# Patient Record
Sex: Female | Born: 1960 | Race: White | Hispanic: No | Marital: Married | Smoking: Never smoker
Health system: Southern US, Community
[De-identification: ages and names within clinical notes are randomized; demographics above are authoritative.]

## PROBLEM LIST (undated history)

## (undated) DIAGNOSIS — F32A Depression, unspecified: Secondary | ICD-10-CM

## (undated) DIAGNOSIS — I214 Non-ST elevation (NSTEMI) myocardial infarction: Secondary | ICD-10-CM

## (undated) DIAGNOSIS — F411 Generalized anxiety disorder: Secondary | ICD-10-CM

---

## 2020-09-10 HISTORY — PX: CARDIAC CATHETERIZATION: SHX172

## 2020-11-01 ENCOUNTER — Other Ambulatory Visit (HOSPITAL_COMMUNITY): Payer: 59

## 2020-11-01 ENCOUNTER — Inpatient Hospital Stay
Admission: AD | Admit: 2020-11-01 | Discharge: 2020-11-22 | Disposition: A | Discharge status: Rehabilitation Facility | Payer: 59 | Source: Other Acute Inpatient Hospital | Attending: Internal Medicine | Admitting: Internal Medicine

## 2020-11-01 DIAGNOSIS — R1310 Dysphagia, unspecified: Secondary | ICD-10-CM

## 2020-11-01 DIAGNOSIS — Z0189 Encounter for other specified special examinations: Secondary | ICD-10-CM

## 2020-11-01 DIAGNOSIS — I2699 Other pulmonary embolism without acute cor pulmonale: Secondary | ICD-10-CM

## 2020-11-01 DIAGNOSIS — I96 Gangrene, not elsewhere classified: Secondary | ICD-10-CM

## 2020-11-01 DIAGNOSIS — D62 Acute posthemorrhagic anemia: Secondary | ICD-10-CM

## 2020-11-01 DIAGNOSIS — J9690 Respiratory failure, unspecified, unspecified whether with hypoxia or hypercapnia: Secondary | ICD-10-CM

## 2020-11-01 DIAGNOSIS — I480 Paroxysmal atrial fibrillation: Secondary | ICD-10-CM

## 2020-11-01 HISTORY — DX: Depression, unspecified: F32.A

## 2020-11-01 HISTORY — DX: Generalized anxiety disorder: F41.1

## 2020-11-01 HISTORY — DX: Non-ST elevation (NSTEMI) myocardial infarction: I21.4

## 2020-11-01 LAB — CBC WITH DIFFERENTIAL/PLATELET
Abs Immature Granulocytes: 0.35 10*3/uL — ABNORMAL HIGH (ref 0.00–0.07)
Basophils Absolute: 0.2 10*3/uL — ABNORMAL HIGH (ref 0.0–0.1)
Basophils Relative: 1 %
Eosinophils Absolute: 0.3 10*3/uL (ref 0.0–0.5)
Eosinophils Relative: 2 %
HCT: 30.7 % — ABNORMAL LOW (ref 36.0–46.0)
Hemoglobin: 9.5 g/dL — ABNORMAL LOW (ref 12.0–15.0)
Immature Granulocytes: 2 %
Lymphocytes Relative: 19 %
Lymphs Abs: 3.4 10*3/uL (ref 0.7–4.0)
MCH: 30.1 pg (ref 26.0–34.0)
MCHC: 30.9 g/dL (ref 30.0–36.0)
MCV: 97.2 fL (ref 80.0–100.0)
Monocytes Absolute: 1.2 10*3/uL — ABNORMAL HIGH (ref 0.1–1.0)
Monocytes Relative: 7 %
Neutro Abs: 12.6 10*3/uL — ABNORMAL HIGH (ref 1.7–7.7)
Neutrophils Relative %: 69 %
Platelets: 396 10*3/uL (ref 150–400)
RBC: 3.16 MIL/uL — ABNORMAL LOW (ref 3.87–5.11)
RDW: 19.6 % — ABNORMAL HIGH (ref 11.5–15.5)
WBC: 18 10*3/uL — ABNORMAL HIGH (ref 4.0–10.5)
nRBC: 0.2 % (ref 0.0–0.2)

## 2020-11-02 LAB — COMPREHENSIVE METABOLIC PANEL
ALT: 19 U/L (ref 0–44)
AST: 24 U/L (ref 15–41)
Albumin: 2.4 g/dL — ABNORMAL LOW (ref 3.5–5.0)
Alkaline Phosphatase: 86 U/L (ref 38–126)
Anion gap: 10 (ref 5–15)
BUN: 29 mg/dL — ABNORMAL HIGH (ref 6–20)
CO2: 24 mmol/L (ref 22–32)
Calcium: 8.6 mg/dL — ABNORMAL LOW (ref 8.9–10.3)
Chloride: 115 mmol/L — ABNORMAL HIGH (ref 98–111)
Creatinine, Ser: 0.81 mg/dL (ref 0.44–1.00)
GFR, Estimated: >60 mL/min (ref >60)
Glucose, Bld: 133 mg/dL — ABNORMAL HIGH (ref 70–99)
Potassium: 3.7 mmol/L (ref 3.5–5.1)
Sodium: 149 mmol/L — ABNORMAL HIGH (ref 135–145)
Total Bilirubin: 1 mg/dL (ref 0.3–1.2)
Total Protein: 6.9 g/dL (ref 6.5–8.1)

## 2020-11-02 LAB — C DIFFICILE QUICK SCREEN W PCR REFLEX
C Diff antigen: NEGATIVE
C Diff interpretation: NOT DETECTED
C Diff toxin: NEGATIVE

## 2020-11-02 LAB — CBC
HCT: 30.2 % — ABNORMAL LOW (ref 36.0–46.0)
Hemoglobin: 9.3 g/dL — ABNORMAL LOW (ref 12.0–15.0)
MCH: 30.5 pg (ref 26.0–34.0)
MCHC: 30.8 g/dL (ref 30.0–36.0)
MCV: 99 fL (ref 80.0–100.0)
Platelets: 373 10*3/uL (ref 150–400)
RBC: 3.05 MIL/uL — ABNORMAL LOW (ref 3.87–5.11)
RDW: 19.5 % — ABNORMAL HIGH (ref 11.5–15.5)
WBC: 19.4 10*3/uL — ABNORMAL HIGH (ref 4.0–10.5)
nRBC: 0 % (ref 0.0–0.2)

## 2020-11-02 LAB — TSH: TSH: 5.77 u[IU]/mL — ABNORMAL HIGH (ref 0.350–4.500)

## 2020-11-02 LAB — PROTIME-INR
INR: 1.8 — ABNORMAL HIGH (ref 0.8–1.2)
Prothrombin Time: 20.9 seconds — ABNORMAL HIGH (ref 11.4–15.2)

## 2020-11-02 LAB — MAGNESIUM: Magnesium: 1.9 mg/dL (ref 1.7–2.4)

## 2020-11-02 LAB — PHOSPHORUS: Phosphorus: 3.7 mg/dL (ref 2.5–4.6)

## 2020-11-02 NOTE — Consult Note (Signed)
Referring Physician: Carron Curie. MD  [name] [name] is an 60 y.o. female.                       Chief Complaint: Tachycardia  HPI: 60 years  Old white female with PMH of Pulmonary embolus, Septic shock, Acute kidney injury, HTN, DIC, Thrombocytopenia and pneumonia, Psoriatic arthritis has sinus tachycardia from dehydration and sepsis.  She has normal coronaries on cardiac cath done on 09/24/2020 and normal LV systolic function on 10/09/2020. Her sodium and chloride are elevated at 149 and 115 respectively. Albumin is low at 2.4 gm. Magnesium and phosphorus are normal. TSH is mildly elevated at 5.770 uIU. Patient denies chest pain or palpitation. She has mild fever.   PMH: As per HPI.  Surgical History   Surgery Date Site/Laterality Comments  CESAREAN SECTION, CLASSIC 1987 & 37      Family History   Medical History Relation Comments  Diabetes Father    Hyperlipidemia Father     Relation Status Comments  Father      Social History   Tobacco Use Types Packs/Day Years Used Date  Never Smoker      Smokeless Tobacco: Never Used      Alcohol Use Standard Drinks/Week Comments  No 0 (1 standard drink = 0.6 oz pure alcohol)     Allergies: No known allergies.  Medications prior to admission.  See med list on chart.  IV fluconazole, Tygacil, amiodarone, Diltiazem, Eliquis, Metoprolol etc.  Results for orders placed or performed during the hospital encounter of 11/01/20 (from the past 48 hour(s))  C Difficile Quick Screen w PCR reflex     Status: None   Collection Time: 11/01/20  4:13 PM   Specimen: STOOL  Result Value Ref Range   C Diff antigen NEGATIVE NEGATIVE   C Diff toxin NEGATIVE NEGATIVE   C Diff interpretation No C. difficile detected.     Comment: Performed at [facility], [address], [place], [place] [address]  CBC with Differential/Platelet     Status: Abnormal   Collection Time: 11/01/20  5:09 PM  Result Value Ref Range   WBC 18.0 (H)  4.0 - 10.5 K/uL   RBC 3.16 (L) 3.87 - 5.11 MIL/uL   Hemoglobin 9.5 (L) 12.0 - 15.0 g/dL   HCT 81.8 (L) 29.9 - 37.1 %   MCV 97.2 80.0 - 100.0 fL   MCH 30.1 26.0 - 34.0 pg   MCHC 30.9 30.0 - 36.0 g/dL   RDW 69.6 (H) 78.9 - 38.1 %   Platelets 396 150 - 400 K/uL   nRBC 0.2 0.0 - 0.2 %   Neutrophils Relative % 69 %   Neutro Abs 12.6 (H) 1.7 - 7.7 K/uL   Lymphocytes Relative 19 %   Lymphs Abs 3.4 0.7 - 4.0 K/uL   Monocytes Relative 7 %   Monocytes Absolute 1.2 (H) 0.1 - 1.0 K/uL   Eosinophils Relative 2 %   Eosinophils Absolute 0.3 0.0 - 0.5 K/uL   Basophils Relative 1 %   Basophils Absolute 0.2 (H) 0.0 - 0.1 K/uL   Immature Granulocytes 2 %   Abs Immature Granulocytes 0.35 (H) 0.00 - 0.07 K/uL    Comment: Performed at [facility], [address], [place], [place] [address]  Comprehensive metabolic panel     Status: Abnormal   Collection Time: 11/02/20  4:27 AM  Result Value Ref Range   Sodium 149 (H) 135 - 145 mmol/L  Potassium 3.7 3.5 - 5.1 mmol/L   Chloride 115 (H) 98 - 111 mmol/L   CO2 24 22 - 32 mmol/L   Glucose, Bld 133 (H) 70 - 99 mg/dL    Comment: Glucose reference range applies only to samples taken after fasting for at least 8 hours.   BUN 29 (H) 6 - 20 mg/dL   Creatinine, Ser 7.61 0.44 - 1.00 mg/dL   Calcium 8.6 (L) 8.9 - 10.3 mg/dL   Total Protein 6.9 6.5 - 8.1 g/dL   Albumin 2.4 (L) 3.5 - 5.0 g/dL   AST 24 15 - 41 U/L   ALT 19 0 - 44 U/L   Alkaline Phosphatase 86 38 - 126 U/L   Total Bilirubin 1.0 0.3 - 1.2 mg/dL   GFR, Estimated >60 >73 mL/min    Comment: (NOTE) Calculated using the CKD-EPI Creatinine Equation (2021)    Anion gap 10 5 - 15    Comment: Performed at [facility], [address], [place], [place] [address]  Protime-INR     Status: Abnormal   Collection Time: 11/02/20  4:27 AM  Result Value Ref Range   Prothrombin Time 20.9 (H) 11.4 - 15.2 seconds   INR 1.8 (H) 0.8 - 1.2    Comment: (NOTE) INR goal varies based on device and  disease states. Performed at [facility], [address] [place], [place] [address]   CBC     Status: Abnormal   Collection Time: 11/02/20  4:27 AM  Result Value Ref Range   WBC 19.4 (H) 4.0 - 10.5 K/uL   RBC 3.05 (L) 3.87 - 5.11 MIL/uL   Hemoglobin 9.3 (L) 12.0 - 15.0 g/dL   HCT 46.2 (L) 70.3 - 50.0 %   MCV 99.0 80.0 - 100.0 fL   MCH 30.5 26.0 - 34.0 pg   MCHC 30.8 30.0 - 36.0 g/dL   RDW 93.8 (H) 18.2 - 99.3 %   Platelets 373 150 - 400 K/uL   nRBC 0.0 0.0 - 0.2 %    Comment: Performed at [facility], [address], [place], [place] [address]  Magnesium     Status: None   Collection Time: 11/02/20  4:27 AM  Result Value Ref Range   Magnesium 1.9 1.7 - 2.4 mg/dL    Comment: Performed at [facility], [address] [place], [place] [address]  Phosphorus     Status: None   Collection Time: 11/02/20  4:27 AM  Result Value Ref Range   Phosphorus 3.7 2.5 - 4.6 mg/dL    Comment: Performed at [facility], [address] [place], [place] [address]  TSH     Status: Abnormal   Collection Time: 11/02/20  4:36 PM  Result Value Ref Range   TSH 5.770 (H) 0.350 - 4.500 uIU/mL    Comment: Performed by a 3rd Generation assay with a functional sensitivity of <=0.01 uIU/mL. Performed at [facility], [address] [place], [place] [address]    DG Chest Port 1 View  Result Date: 11/01/2020 CLINICAL DATA:  Respiratory failure. EXAM: PORTABLE CHEST 1 VIEW COMPARISON:  None. FINDINGS: The heart size and mediastinal contours are within normal limits. No pneumothorax is noted. Left lung is clear. Large right basilar opacity is noted concerning for pneumonia or atelectasis with associated small right pleural effusion. Right-sided PICC line is noted with distal tip in expected position of the SVC. The visualized skeletal structures are unremarkable.  IMPRESSION: Large right basilar opacity is noted concerning for pneumonia or atelectasis with associated small right  pleural effusion. Electronically Signed   By: Lupita Raider M.D.   On: 11/01/2020 16:35   DG Abd Portable 1V  Result Date: 11/01/2020 CLINICAL DATA:  Feeding tube placement. EXAM: PORTABLE ABDOMEN - 1 VIEW COMPARISON:  None. FINDINGS: Feeding tube is seen passing through the esophagus and duodenum and beyond the expected position of the ligament of Treitz, with distal tip in expected position of proximal jejunum. IMPRESSION: Distal tip of feeding tube is seen in expected position of proximal jejunum. Electronically Signed   By: Lupita Raider M.D.   On: 11/01/2020 16:34    Review Of Systems Constitutional: No fever, chills, weight loss or gain. Eyes: No vision change, wears glasses. No discharge or pain. Ears: No hearing loss, No tinnitus. Respiratory: No asthma, COPD, positive pneumonias, shortness of breath. No hemoptysis. Cardiovascular: No chest pain, palpitation, leg edema. Gastrointestinal: No nausea, vomiting, diarrhea, constipation. No GI bleed. No hepatitis. Genitourinary: No dysuria, hematuria, kidney stone. No incontinance. Neurological: No headache, stroke, seizures.  Psychiatry: No psych facility admission for anxiety, depression, suicide. No detox. Skin: No rash. Musculoskeletal: No joint pain, fibromyalgia. No neck pain, back pain. Lymphadenopathy: No lymphadenopathy. Hematology: Positive anemia or easy bruising.  P: 114, R: 26, BP: 103/54, O2 sat 94 % on room air.  There is no height or weight on file to calculate BMI. General appearance: alert, cooperative, appears stated age and moderate respiratory distress Head: Normocephalic, atraumatic. Eyes: Blue eyes, pale pink conjunctiva, corneas clear.  Neck: No adenopathy, no carotid bruit, no JVD, supple, symmetrical, trachea midline and thyroid not enlarged. Resp: Basal crackles to auscultation on right side. Cardio: Tahcycardic, Regular rate and rhythm, S1, S2 normal, II/VI systolic murmur, no click, rub or gallop GI:  Soft, non-tender; bowel sounds normal; no organomegaly. Extremities: Trace edema, cyanosis or clubbing. Dark skin of toes of both feet. Skin: Warm and dry.  Neurologic: Alert and oriented X 2, moves all 4 extremites..   Assessment/Plan Acute on chronic respiratory failure with hypoxia Sinus tachycardia due to dehydration, sepsis, fever. S/P Sepsis S/P right lung pneumonia S/P UTI Dehydration Hypernatremia and hyperchloremia Sinus tachycardia Psoriatic arthritis Anemia of chronic disease Hypoalbuminemia  Agree with decreasing amiodarone dose. Agree with IV fluids like D5W IV albumin x 1. Supplemental oxygen as needed.  Time spent: Review of old records, Lab, x-rays, EKG, other cardiac tests, examination, discussion with patient over 70 minutes.  Ricki Rodriguez, MD  11/02/2020, 9:39 PM

## 2020-11-03 NOTE — Consult Note (Signed)
Ref: Pcp, No   Subjective:  Awake. Hypotension improves with IV fluid boluses.  Objective:  Vital Signs in the last 24 hours: BP: 117/48, P: 84, R: 21, O2 sat 98 % on 2 L by [place].   Physical Exam: BP Readings from Last 1 Encounters:  No data found for BP     Wt Readings from Last 1 Encounters:  No data found for Wt    Weight change:  There is no height or weight on file to calculate BMI. HEENT: [place]/AT, Eyes-Blue, Conjunctiva-Pale pink, Sclera-Non-icteric Neck: No JVD, No bruit, Trachea midline. Lungs:  Clearing, Bilateral. Cardiac:  Regular rhythm, normal S1 and S2, no S3. II/VI systolic murmur. Abdomen:  Soft, non-tender. BS present. Extremities:  1 + edema present. No cyanosis. No clubbing. CNS: AxOx2, Cranial nerves grossly intact, moves all 4 extremities.  Skin: Warm and dry.   Intake/Output from previous day: No intake/output data recorded.    Lab Results: BMET    Component Value Date/Time   NA 149 (H) 11/02/2020 0427   K 3.7 11/02/2020 0427   CL 115 (H) 11/02/2020 0427   CO2 24 11/02/2020 0427   GLUCOSE 133 (H) 11/02/2020 0427   BUN 29 (H) 11/02/2020 0427   CREATININE 0.81 11/02/2020 0427   CALCIUM 8.6 (L) 11/02/2020 0427   GFRNONAA >60 11/02/2020 0427   CBC    Component Value Date/Time   WBC 19.4 (H) 11/02/2020 0427   RBC 3.05 (L) 11/02/2020 0427   HGB 9.3 (L) 11/02/2020 0427   HCT 30.2 (L) 11/02/2020 0427   PLT 373 11/02/2020 0427   MCV 99.0 11/02/2020 0427   MCH 30.5 11/02/2020 0427   MCHC 30.8 11/02/2020 0427   RDW 19.5 (H) 11/02/2020 0427   LYMPHSABS 3.4 11/01/2020 1709   MONOABS 1.2 (H) 11/01/2020 1709   EOSABS 0.3 11/01/2020 1709   BASOSABS 0.2 (H) 11/01/2020 1709   HEPATIC Function Panel Recent Labs    11/02/20 0427  PROT 6.9   HEMOGLOBIN A1C No components found for: HGA1C,  MPG CARDIAC ENZYMES No results found for: CKTOTAL, CKMB, CKMBINDEX, TROPONINI BNP No results for input(s): PROBNP in the last 8760 hours. TSH Recent Labs     11/02/20 1636  TSH 5.770*   CHOLESTEROL No results for input(s): CHOL in the last 8760 hours.  Scheduled Meds: Continuous Infusions: PRN Meds:.  Assessment/Plan: Acute on chronic respiratory failure with hypoxia Sinus tachycardia, controlled S/P Sepsis S/P right lung pneumonia S/P UTI Dehydration, improving Psoriatic arthritis Anemia of chronic disease Hypoalbuminemia  CBC and CMET in AM.   LOS: 0 days   Time spent including chart review, lab review, examination, discussion with patient/Nurse : 30 min   Orpah Cobb  MD  11/03/2020, 9:23 PM

## 2020-11-04 LAB — CBC WITH DIFFERENTIAL/PLATELET
Abs Immature Granulocytes: 0.21 10*3/uL — ABNORMAL HIGH (ref 0.00–0.07)
Basophils Absolute: 0.1 10*3/uL (ref 0.0–0.1)
Basophils Relative: 1 %
Eosinophils Absolute: 0.3 10*3/uL (ref 0.0–0.5)
Eosinophils Relative: 2 %
HCT: 24.5 % — ABNORMAL LOW (ref 36.0–46.0)
Hemoglobin: 7.4 g/dL — ABNORMAL LOW (ref 12.0–15.0)
Immature Granulocytes: 1 %
Lymphocytes Relative: 16 %
Lymphs Abs: 2.5 10*3/uL (ref 0.7–4.0)
MCH: 30 pg (ref 26.0–34.0)
MCHC: 30.2 g/dL (ref 30.0–36.0)
MCV: 99.2 fL (ref 80.0–100.0)
Monocytes Absolute: 0.8 10*3/uL (ref 0.1–1.0)
Monocytes Relative: 5 %
Neutro Abs: 11.8 10*3/uL — ABNORMAL HIGH (ref 1.7–7.7)
Neutrophils Relative %: 75 %
Platelets: 382 10*3/uL (ref 150–400)
RBC: 2.47 MIL/uL — ABNORMAL LOW (ref 3.87–5.11)
RDW: 17.8 % — ABNORMAL HIGH (ref 11.5–15.5)
WBC: 15.6 10*3/uL — ABNORMAL HIGH (ref 4.0–10.5)
nRBC: 0 % (ref 0.0–0.2)

## 2020-11-04 LAB — PROTIME-INR
INR: 1.8 — ABNORMAL HIGH (ref 0.8–1.2)
Prothrombin Time: 20.5 seconds — ABNORMAL HIGH (ref 11.4–15.2)

## 2020-11-04 LAB — COMPREHENSIVE METABOLIC PANEL
ALT: 18 U/L (ref 0–44)
AST: 22 U/L (ref 15–41)
Albumin: 1.9 g/dL — ABNORMAL LOW (ref 3.5–5.0)
Alkaline Phosphatase: 69 U/L (ref 38–126)
Anion gap: 6 (ref 5–15)
BUN: 19 mg/dL (ref 6–20)
CO2: 26 mmol/L (ref 22–32)
Calcium: 7.8 mg/dL — ABNORMAL LOW (ref 8.9–10.3)
Chloride: 111 mmol/L (ref 98–111)
Creatinine, Ser: 0.74 mg/dL (ref 0.44–1.00)
GFR, Estimated: >60 mL/min (ref >60)
Glucose, Bld: 109 mg/dL — ABNORMAL HIGH (ref 70–99)
Potassium: 2.9 mmol/L — ABNORMAL LOW (ref 3.5–5.1)
Sodium: 143 mmol/L (ref 135–145)
Total Bilirubin: 0.6 mg/dL (ref 0.3–1.2)
Total Protein: 5.9 g/dL — ABNORMAL LOW (ref 6.5–8.1)

## 2020-11-04 LAB — PREPARE RBC (CROSSMATCH)

## 2020-11-04 LAB — ABO/RH: ABO/RH(D): O POS

## 2020-11-05 LAB — TYPE AND SCREEN
ABO/RH(D): O POS
Antibody Screen: NEGATIVE
Unit division: 0

## 2020-11-05 LAB — BASIC METABOLIC PANEL
Anion gap: 9 (ref 5–15)
BUN: 12 mg/dL (ref 6–20)
CO2: 25 mmol/L (ref 22–32)
Calcium: 7.8 mg/dL — ABNORMAL LOW (ref 8.9–10.3)
Chloride: 111 mmol/L (ref 98–111)
Creatinine, Ser: 0.6 mg/dL (ref 0.44–1.00)
GFR, Estimated: >60 mL/min (ref >60)
Glucose, Bld: 100 mg/dL — ABNORMAL HIGH (ref 70–99)
Potassium: 3.5 mmol/L (ref 3.5–5.1)
Sodium: 145 mmol/L (ref 135–145)

## 2020-11-05 LAB — BPAM RBC
Blood Product Expiration Date: 202205202359
ISSUE DATE / TIME: 202204251521
Unit Type and Rh: 5100

## 2020-11-05 LAB — VANCOMYCIN, TROUGH: Vancomycin Tr: 14 ug/mL — ABNORMAL LOW (ref 15–20)

## 2020-11-06 LAB — EXPECTORATED SPUTUM ASSESSMENT W GRAM STAIN, RFLX TO RESP C

## 2020-11-06 LAB — CBC
HCT: 31.3 % — ABNORMAL LOW (ref 36.0–46.0)
Hemoglobin: 9.7 g/dL — ABNORMAL LOW (ref 12.0–15.0)
MCH: 30.1 pg (ref 26.0–34.0)
MCHC: 31 g/dL (ref 30.0–36.0)
MCV: 97.2 fL (ref 80.0–100.0)
Platelets: 494 10*3/uL — ABNORMAL HIGH (ref 150–400)
RBC: 3.22 MIL/uL — ABNORMAL LOW (ref 3.87–5.11)
RDW: 18.7 % — ABNORMAL HIGH (ref 11.5–15.5)
WBC: 9.9 10*3/uL (ref 4.0–10.5)
nRBC: 0.2 % (ref 0.0–0.2)

## 2020-11-07 ENCOUNTER — Other Ambulatory Visit (HOSPITAL_COMMUNITY): Payer: 59

## 2020-11-07 LAB — BASIC METABOLIC PANEL
Anion gap: 10 (ref 5–15)
BUN: 8 mg/dL (ref 6–20)
CO2: 27 mmol/L (ref 22–32)
Calcium: 7.6 mg/dL — ABNORMAL LOW (ref 8.9–10.3)
Chloride: 102 mmol/L (ref 98–111)
Creatinine, Ser: 0.59 mg/dL (ref 0.44–1.00)
GFR, Estimated: >60 mL/min (ref >60)
Glucose, Bld: 123 mg/dL — ABNORMAL HIGH (ref 70–99)
Potassium: 3.7 mmol/L (ref 3.5–5.1)
Sodium: 139 mmol/L (ref 135–145)

## 2020-11-07 LAB — CBC
HCT: 31.1 % — ABNORMAL LOW (ref 36.0–46.0)
Hemoglobin: 9.5 g/dL — ABNORMAL LOW (ref 12.0–15.0)
MCH: 29.7 pg (ref 26.0–34.0)
MCHC: 30.5 g/dL (ref 30.0–36.0)
MCV: 97.2 fL (ref 80.0–100.0)
Platelets: 502 10*3/uL — ABNORMAL HIGH (ref 150–400)
RBC: 3.2 MIL/uL — ABNORMAL LOW (ref 3.87–5.11)
RDW: 18.5 % — ABNORMAL HIGH (ref 11.5–15.5)
WBC: 9.9 10*3/uL (ref 4.0–10.5)
nRBC: 0 % (ref 0.0–0.2)

## 2020-11-08 LAB — CULTURE, BLOOD (ROUTINE X 2)
Culture: NO GROWTH
Culture: NO GROWTH
Special Requests: ADEQUATE
Special Requests: ADEQUATE

## 2020-11-08 NOTE — Consult Note (Signed)
Infectious Disease Consultation   [name] [name]  YBW:[id number]  DOB: [birth year]  DOA: 11/01/2020  Requesting physician: Dr. Manson Passey  Reason for consultation: Antibiotic recommendations   History of Present Illness: [name] [name] is an 60 y.o. female with medical history significant of psoriatic arthritis, and anti-interleukin-17A antibody, hypertension, coronary artery disease status post NSTEMI in the past and was admitted to the acute facility on 10/08/2020 with septic shock.  Her blood cultures showed Streptococcus pyogenous.  She also had DIC with acute renal failure necessitating CRRT and also had elevated troponins.  Patient was started on hemodialysis.  She also underwent thoracentesis.  She received treatment with multiple antibiotics including clindamycin, Levaquin, Flagyl, empiric fluconazole, Tygacil at the acute facility per infectious disease.  She also received treatment with IVIG.  She developed digital ischemia while on pressors due to the sepsis.  She developed respiratory failure and was intubated.  She was later extubated.  Patient also had right lower lobe pneumonia and was treated for that.  Due to her complex medical problems she was transferred and admitted to [facility].  After admission here patient reportedly started having fevers.  There was also concern for worsening infection of the lower extremities.  She is complaining of shortness of breath, generalized aches and pains.  She is also having fevers. On further questioning the patient she mentioned that she recently had a heart cath and after that had dental extraction done and that probably was the likely source for the bacteremia.  Review of Systems:  Review of systems negative except as mentioned above in the HPI.  Past Medical History: Psoriatic arthritis, hypertension, coronary artery disease status post NSTEMI  Past Surgical History: History of C-section  Allergies: No known drug  allergies  Social History: No history of smoking, alcohol or recreational drug abuse   Family History: History of CVA  Physical Exam: Temperature 99.7, pulse 96, respiratory 19, blood pressure 127/79, oxygen saturation 95% Constitutional: Awake and oriented Head: Atraumatic, normocephalic Eyes: PERLA, EOMI, irises appear normal, anicteric sclera,  ENMT: external ears and nose appear normal, normal hearing, Lips appears normal, moist oral mucosa Neck: neck appears normal, no masses  CVS: S1-S2  Respiratory: Decreased breath sound lower lobes, rhonchi, no wheezing.  Abdomen: soft nontender, nondistended, normal bowel sounds, no hepatosplenomegaly, no hernias  Musculoskeletal: She has ischemia with dry gangrene of fingers of both hands, toes Neuro: Debility with generalized weakness otherwise grossly nonfocal Psych: stable mood and affect, mental status Skin: Ischemia of the digits as mentioned above.  Data reviewed:  I have personally reviewed following labs and imaging studies Labs:  CBC: Recent Labs  Lab 11/02/20 0427 11/04/20 0738 11/06/20 0658 11/07/20 0614  WBC 19.4* 15.6* 9.9 9.9  NEUTROABS  --  11.8*  --   --   HGB 9.3* 7.4* 9.7* 9.5*  HCT 30.2* 24.5* 31.3* 31.1*  MCV 99.0 99.2 97.2 97.2  PLT 373 382 494* 502*    Basic Metabolic Panel: Recent Labs  Lab 11/02/20 0427 11/04/20 0738 11/05/20 0436 11/07/20 0527  NA 149* 143 145 139  K 3.7 2.9* 3.5 3.7  CL 115* 111 111 102  CO2 24 26 25 27   GLUCOSE 133* 109* 100* 123*  BUN 29* 19 12 8   CREATININE 0.81 0.74 0.60 0.59  CALCIUM 8.6* 7.8* 7.8* 7.6*  MG 1.9  --   --   --   PHOS 3.7  --   --   --  GFR CrCl cannot be calculated (Unknown ideal weight.). Liver Function Tests: Recent Labs  Lab 11/02/20 0427 11/04/20 0738  AST 24 22  ALT 19 18  ALKPHOS 86 69  BILITOT 1.0 0.6  PROT 6.9 5.9*  ALBUMIN 2.4* 1.9*   No results for input(s): LIPASE, AMYLASE in the last 168 hours. No results for input(s):  AMMONIA in the last 168 hours. Coagulation profile Recent Labs  Lab 11/02/20 0427 11/04/20 0738  INR 1.8* 1.8*    Cardiac Enzymes: No results for input(s): CKTOTAL, CKMB, CKMBINDEX, TROPONINI in the last 168 hours. BNP: Invalid input(s): POCBNP CBG: No results for input(s): GLUCAP in the last 168 hours. D-Dimer No results for input(s): DDIMER in the last 72 hours. Hgb A1c No results for input(s): HGBA1C in the last 72 hours. Lipid Profile No results for input(s): CHOL, HDL, LDLCALC, TRIG, CHOLHDL, LDLDIRECT in the last 72 hours. Thyroid function studies No results for input(s): TSH, T4TOTAL, T3FREE, THYROIDAB in the last 72 hours.  Invalid input(s): FREET3 Anemia work up No results for input(s): VITAMINB12, FOLATE, FERRITIN, TIBC, IRON, RETICCTPCT in the last 72 hours. Urinalysis No results found for: COLORURINE, APPEARANCEUR, LABSPEC, PHURINE, GLUCOSEU, HGBUR, BILIRUBINUR, KETONESUR, PROTEINUR, UROBILINOGEN, NITRITE, LEUKOCYTESUR   Sepsis Labs Invalid input(s): PROCALCITONIN,  WBC,  LACTICIDVEN Microbiology Recent Results (from the past 240 hour(s))  C Difficile Quick Screen w PCR reflex     Status: None   Collection Time: 11/01/20  4:13 PM   Specimen: STOOL  Result Value Ref Range Status   C Diff antigen NEGATIVE NEGATIVE Final   C Diff toxin NEGATIVE NEGATIVE Final   C Diff interpretation No C. difficile detected.  Final    Comment: Performed at [facility], [address] [place], [place] [address]  Culture, Respiratory w Gram Stain     Status: None (Preliminary result)   Collection Time: 11/03/20 12:00 PM  Result Value Ref Range Status   Specimen Description EXPECTORATED SPUTUM  Final   Special Requests NONE  Final   Gram Stain   Final    MODERATE WBC PRESENT,BOTH PMN AND MONONUCLEAR FEW GRAM POSITIVE COCCI IN PAIRS IN CLUSTERS RARE YEAST    Culture   Final    FEW PSEUDOMONAS AERUGINOSA SUSCEPTIBILITIES TO FOLLOW Performed at [facility], [address] [place], [place] [address]    Report Status PENDING  Incomplete  Culture, blood (routine x 2)     Status: None   Collection Time: 11/03/20  2:06 PM   Specimen: BLOOD  Result Value Ref Range Status   Specimen Description BLOOD RIGHT ANTECUBITAL  Final   Special Requests   Final    BOTTLES DRAWN AEROBIC AND ANAEROBIC Blood Culture adequate volume   Culture   Final    NO GROWTH 5 DAYS Performed at [facility], [address] [place], [place] [address]    Report Status 11/08/2020 FINAL  Final  Culture, blood (routine x 2)     Status: None   Collection Time: 11/03/20  2:12 PM   Specimen: BLOOD  Result Value Ref Range Status   Specimen Description BLOOD LEFT ANTECUBITAL  Final   Special Requests   Final    BOTTLES DRAWN AEROBIC ONLY Blood Culture adequate volume   Culture   Final    NO GROWTH 5 DAYS Performed at [facility], [address] [place], [place] [address]    Report Status 11/08/2020 FINAL  Final  Expectorated Sputum Assessment w  Gram Stain, Rflx to Resp Cult     Status: None   Collection Time: 11/03/20  3:12 PM   Specimen: Expectorated Sputum  Result Value Ref Range Status   Specimen Description EXPECTORATED SPUTUM  Final   Special Requests NONE  Final   Sputum evaluation   Final    THIS SPECIMEN IS ACCEPTABLE FOR SPUTUM CULTURE Performed at [facility], [address], [place], [place] [address]    Report Status 11/06/2020 FINAL  Final    Inpatient Medications:   Please see MAR   Radiological Exams on Admission: CT FOOT LEFT WO CONTRAST  Result Date: 11/07/2020 CLINICAL DATA:  Sepsis EXAM: CT OF THE LEFT FOOT WITHOUT CONTRAST TECHNIQUE: Multidetector CT imaging of the left foot was performed according to the standard protocol. Multiplanar CT image reconstructions were also generated. COMPARISON:  None. FINDINGS: Bones/Joint/Cartilage Normal alignment. No acute fracture or dislocation. Joint spaces are preserved. No osseous  erosions or abnormal periosteal reaction. Ligaments Suboptimally assessed by CT. Muscles and Tendons Mild calcific tendinitis involving the insertion of the Achilles tendon upon the calcaneus. Extensor, flexor, and peroneal tendons are unremarkable. Soft tissues There is marked attenuation of the soft tissues superficial to the a distal phalanx of the second digit with a superficial wound involving the pad of the second digit approaching the a distal, volar aspect of the distal phalanx. There is moderate subcutaneous edema involving the digits of the a left foot and involving the plantar aspect of the left foot. No discrete drainable fluid collection identified. There is dermal thickening/edema noted superficial to the medial malleolus and dorsal lateral to the cuboid. IMPRESSION: Soft tissue attenuation and probable wound involving the terminal portion of the second digit approaching the subjacent distal phalanx. No superimposed osseous erosion to suggest osteomyelitis. Diffuse subcutaneous edema involving the digits and plantar aspect of the left foot. Focal dermal thickening/edema superficial to the medial malleolus and dorsal lateral to the cuboid. No discrete drainable fluid collection. Electronically Signed   By: Helyn Numbers MD   On: 11/07/2020 03:19   CT FOOT RIGHT WO CONTRAST  Result Date: 11/07/2020 CLINICAL DATA:  Sepsis EXAM: CT OF THE RIGHT FOOT WITHOUT CONTRAST TECHNIQUE: Multidetector CT imaging of the right foot was performed according to the standard protocol. Multiplanar CT image reconstructions were also generated. COMPARISON:  None. FINDINGS: Bones/Joint/Cartilage Degenerative changes in the first metatarsal-phalangeal joint. No evidence of acute fracture or dislocation. No focal bone erosion or sclerosis to suggest osteomyelitis. MRI would be more sensitive for evaluation of bone infection if clinically indicated. Ligaments Suboptimally assessed by CT. Muscles and Tendons Grossly intact as  visualized. Soft tissues Diffuse soft tissue edema. No loculated collections are appreciated. IMPRESSION: 1. Degenerative changes in the first metatarsal-phalangeal joint. 2. No evidence of acute fracture or dislocation. 3. Diffuse soft tissue edema. No loculated collections are appreciated. 4. No focal bone erosion or sclerosis to suggest osteomyelitis. MRI would be more sensitive for evaluation of bone infection if clinically indicated. Electronically Signed   By: Burman Nieves M.D.   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 fib with RVR Protein calorie malnutrition/dysphagia  Sepsis with shock: Patient was admitted to the acute facility with sepsis and shock.  She was on pressors at the acute facility.  This was secondary to bacteremia with blood cultures that showed Streptococcus pyogenes patient received multiple antimicrobials at the acute facility as mentioned above.  After presentation here she started having fevers again.  Suggested empiric treatment with IV vancomycin, cefepime, Flagyl.  Empiric clindamycin also was added because of concern for worsening infection of the lower extremities.  CT of the lower extremities did not show any evidence of osteomyelitis or abscess.  Therefore suggest to discontinue clindamycin.  Respiratory cultures showing Pseudomonas aeruginosa.  Preliminary blood cultures do not show any growth.  Follow-up on the final blood cultures and adjust antibiotics accordingly.  Please monitor BUN/creatinine closely while on antibiotics.  Bacteremia: Patient had bacteremia with Streptococcus pyogenes at the acute facility.  She also has sepsis and was on pressors.  Here  she was having fevers.  On empiric antibiotics as mentioned above.  Clinically blood cultures from here did not show any growth.  Follow-up on the final blood cultures and adjust antibiotics accordingly.  Patient reportedly had 2D echocardiogram done at the acute facility that did not show any vegetations.  Pneumonia: Respiratory cultures collected on 2 11/03/2020 showing Pseudomonas aeruginosa and few gram-positive cocci in pairs and clusters on the gram stain.  On antibiotics as mentioned above.  She also has dysphagia and at risk for aspiration.  Plan to treat for duration of 1 week pending improvement.  If respiratory status worsens suggest repeat chest imaging preferably chest CT which can be done without contrast if concern for renal compromise.  She had some diarrhea but C. difficile negative.  She is high risk for C. difficile given the multiple antibiotics that she has received.  If she starts having diarrhea again then suggest to treat her with empiric p.o. vancomycin.  Ischemia with dry gangrene of the digits of the hands and feet: There was some concern of worsening necrosis of the lower extremities.  CT of the feet does not show any evidence of osteomyelitis or any evidence of worsening necrosis or gas producing organisms.  Suggest to discontinue clindamycin.  Continue the other antibiotics.  The dry gangrene and ischemia was as a result of the patient being on pressors at the acute facility.  Continue local wound care.  DIC/transaminitis: As a result of the sepsis.  Please monitor counts closely.  Further management per the primary team.  Acute hypoxemic respiratory failure: This was likely from the sepsis.  She also was treated for aspiration pneumonia at the acute facility.  Respiratory cultures here showing Pseudomonas.  Antibiotics as mentioned above.  She is currently extubated.  Continue to monitor respiratory status.  As mentioned above, due to her dysphagia she is high risk for recurrent  aspiration and worsening respiratory failure despite being on antibiotics.  UTI: Urine culture showed yeast.  She is status post treatment with Diflucan at the acute facility.  Currently denies having any urinary symptoms.  Continue to monitor.  Coronary disease status post NSTEMI: Continue medication and management per the primary team.  A. fib with RVR: Continue medications and management per primary team.  She is on anticoagulation with Eliquis.  Dysphagia/protein calorie malnutrition: Management per primary team.  Due to the dysphagia she is high risk for recurrent aspiration and aspiration pneumonia despite being on antibiotics.  Plan of care discussed with the patient.  Plan of care also discussed with the primary team and pharmacy.   Thank you for this consultation.    Vonzella Nipple M.D. 11/08/2020, 5:50 PM

## 2020-11-09 LAB — CULTURE, RESPIRATORY W GRAM STAIN

## 2020-11-09 NOTE — Consult Note (Signed)
Ref: Pcp, No   Subjective:  Resting comfortably. Off oxygen by [place]. Episode of 2 second sinus pause v/s AV block.  Objective:  Vital Signs in the last 24 hours: BP: 100/60, P: 86, R: 14, O2 sat 95 %.  Physical Exam: BP Readings from Last 1 Encounters:  No data found for BP     Wt Readings from Last 1 Encounters:  No data found for Wt    Weight change:  There is no height or weight on file to calculate BMI. HEENT: [place]/AT, Eyes-Blue, Conjunctiva-Pink, Sclera-Non-icteric Neck: No JVD, No bruit, Trachea midline. Lungs:  Clear, Bilateral. Cardiac:  Regular rhythm, normal S1 and S2, no S3. II/VI systolic murmur. Abdomen:  Soft, non-tender. BS present. Extremities:  Trace edema present. No cyanosis. No clubbing. CNS: AxOx2, Cranial nerves grossly intact, moves all 4 extremities.  Skin: Warm and dry.   Intake/Output from previous day: No intake/output data recorded.    Lab Results: BMET    Component Value Date/Time   NA 139 11/07/2020 0527   NA 145 11/05/2020 0436   NA 143 11/04/2020 0738   K 3.7 11/07/2020 0527   K 3.5 11/05/2020 0436   K 2.9 (L) 11/04/2020 0738   CL 102 11/07/2020 0527   CL 111 11/05/2020 0436   CL 111 11/04/2020 0738   CO2 27 11/07/2020 0527   CO2 25 11/05/2020 0436   CO2 26 11/04/2020 0738   GLUCOSE 123 (H) 11/07/2020 0527   GLUCOSE 100 (H) 11/05/2020 0436   GLUCOSE 109 (H) 11/04/2020 0738   BUN 8 11/07/2020 0527   BUN 12 11/05/2020 0436   BUN 19 11/04/2020 0738   CREATININE 0.59 11/07/2020 0527   CREATININE 0.60 11/05/2020 0436   CREATININE 0.74 11/04/2020 0738   CALCIUM 7.6 (L) 11/07/2020 0527   CALCIUM 7.8 (L) 11/05/2020 0436   CALCIUM 7.8 (L) 11/04/2020 0738   GFRNONAA >60 11/07/2020 0527   GFRNONAA >60 11/05/2020 0436   GFRNONAA >60 11/04/2020 0738   CBC    Component Value Date/Time   WBC 9.9 11/07/2020 0614   RBC 3.20 (L) 11/07/2020 0614   HGB 9.5 (L) 11/07/2020 0614   HCT 31.1 (L) 11/07/2020 0614   PLT 502 (H) 11/07/2020 0614    MCV 97.2 11/07/2020 0614   MCH 29.7 11/07/2020 0614   MCHC 30.5 11/07/2020 0614   RDW 18.5 (H) 11/07/2020 0614   LYMPHSABS 2.5 11/04/2020 0738   MONOABS 0.8 11/04/2020 0738   EOSABS 0.3 11/04/2020 0738   BASOSABS 0.1 11/04/2020 0738   HEPATIC Function Panel Recent Labs    11/02/20 0427 11/04/20 0738  PROT 6.9 5.9*   HEMOGLOBIN A1C No components found for: HGA1C,  MPG CARDIAC ENZYMES No results found for: CKTOTAL, CKMB, CKMBINDEX, TROPONINI BNP No results for input(s): PROBNP in the last 8760 hours. TSH Recent Labs    11/02/20 1636  TSH 5.770*   CHOLESTEROL No results for input(s): CHOL in the last 8760 hours.  Scheduled Meds: Continuous Infusions: PRN Meds:.  Assessment/Plan: Sinus rhythm with pause S/P Sepsis S/P Right lung pneumonia S/P UTI Psoriatic arthritis  Decrease metoprolol dose by 50 %.   LOS: 0 days   Time spent including chart review, lab review, examination, discussion with patient/Nurse : 25 min   Orpah Cobb  MD  11/09/2020, 10:43 AM

## 2020-11-10 LAB — CBC
HCT: 31.1 % — ABNORMAL LOW (ref 36.0–46.0)
Hemoglobin: 9.6 g/dL — ABNORMAL LOW (ref 12.0–15.0)
MCH: 29.6 pg (ref 26.0–34.0)
MCHC: 30.9 g/dL (ref 30.0–36.0)
MCV: 96 fL (ref 80.0–100.0)
Platelets: 562 10*3/uL — ABNORMAL HIGH (ref 150–400)
RBC: 3.24 MIL/uL — ABNORMAL LOW (ref 3.87–5.11)
RDW: 17.4 % — ABNORMAL HIGH (ref 11.5–15.5)
WBC: 9.6 10*3/uL (ref 4.0–10.5)
nRBC: 0 % (ref 0.0–0.2)

## 2020-11-10 LAB — BASIC METABOLIC PANEL WITH GFR
Anion gap: 9 (ref 5–15)
BUN: <5 mg/dL — ABNORMAL LOW (ref 6–20)
CO2: 28 mmol/L (ref 22–32)
Calcium: 8 mg/dL — ABNORMAL LOW (ref 8.9–10.3)
Chloride: 101 mmol/L (ref 98–111)
Creatinine, Ser: 0.46 mg/dL (ref 0.44–1.00)
GFR, Estimated: >60 mL/min (ref >60)
Glucose, Bld: 90 mg/dL (ref 70–99)
Potassium: 2.3 mmol/L — CL (ref 3.5–5.1)
Sodium: 138 mmol/L (ref 135–145)

## 2020-11-10 LAB — MAGNESIUM: Magnesium: 1.6 mg/dL — ABNORMAL LOW (ref 1.7–2.4)

## 2020-11-11 LAB — POTASSIUM: Potassium: 4.3 mmol/L (ref 3.5–5.1)

## 2020-11-11 LAB — VANCOMYCIN, TROUGH: Vancomycin Tr: 12 ug/mL — ABNORMAL LOW (ref 15–20)

## 2020-11-13 LAB — VANCOMYCIN, TROUGH: Vancomycin Tr: 11 ug/mL — ABNORMAL LOW (ref 15–20)

## 2020-11-13 LAB — MAGNESIUM: Magnesium: 1.5 mg/dL — ABNORMAL LOW (ref 1.7–2.4)

## 2020-11-14 LAB — MAGNESIUM: Magnesium: 2.1 mg/dL (ref 1.7–2.4)

## 2020-11-14 LAB — VANCOMYCIN, TROUGH: Vancomycin Tr: 15 ug/mL (ref 15–20)

## 2020-11-14 NOTE — Progress Notes (Signed)
PROGRESS NOTE    [name] [name]  DXI:[id number] DOB: [birth year] DOA: 11/01/2020  Brief Narrative:  [name] [name] is an 60 y.o. female with medical history significant of psoriatic arthritis, and anti-interleukin-17A antibody, hypertension, coronary artery disease status post NSTEMI in the past and was admitted to the acute facility on 10/08/2020 with septic shock.  Her blood cultures showed Streptococcus pyogenous.  She also had DIC with acute renal failure necessitating CRRT and also had elevated troponins.  Patient was started on hemodialysis.  She also underwent thoracentesis.  She received treatment with multiple antibiotics including clindamycin, Levaquin, Flagyl, empiric fluconazole, Tygacil at the acute facility per infectious disease.  She also received treatment with IVIG.  She developed digital ischemia while on pressors due to the sepsis.  She developed respiratory failure and was intubated.  She was later extubated.  Patient also had right lower lobe pneumonia and was treated for that.  Due to her complex medical problems she was transferred and admitted to [facility].  After admission here patient reportedly started having fevers.  There was also concern for worsening infection of the lower extremities.  She is complaining of shortness of breath, generalized aches and pains.  She is also having fevers. On further questioning the patient she mentioned that she recently had a heart cath and after that had dental extraction done and that probably was the likely source for the bacteremia.  Assessment & Plan: Active Problems: Sepsis with shock, currently shock resolved Bacteremia with Streptococcus pyogenes Pneumonia with Pseudomonas Ischemia with dry gangrene of the digits hands and feet DIC/transaminitis Acute respiratory failure from sepsis, currently extubated UTI Coronary disease with NSTEMI Psoriatic arthritis A. fib with RVR Protein calorie  malnutrition/dysphagia  Sepsis with shock: Patient was admitted to the acute facility with sepsis and shock.  She was on pressors at the acute facility.  This was secondary to bacteremia with blood cultures that showed Streptococcus pyogenes patient received multiple antimicrobials at the acute facility as mentioned above.  After presentation here she started having fevers again.  Suggested empiric treatment with IV vancomycin, cefepime, Flagyl, clindamycin. CT of the lower extremities did not show any evidence of osteomyelitis or abscess.  Therefore suggest to discontinue clindamycin.  Respiratory cultures showed Pseudomonas aeruginosa.    Blood cultures did not show any growth.    She is stable at this time completing treatment with empiric antimicrobials.  Currently she has dry gangrene of her fingers and toes.  However, if this liquefies then she is at high risk for recurrent sepsis.  If she starts having any fevers greater than 101 or worsening leukocytosis suggest to send for pan cultures including repeat blood cultures. -She is also high risk for C. difficile infection given the need for antibiotics.  If she starts having any diarrhea suggest to send stool for C. difficile and start her on p.o. vancomycin.  Bacteremia: Patient had bacteremia with Streptococcus pyogenes at the acute facility.  She also has sepsis and was on pressors.  Here she was having fevers.  On empiric antibiotics as mentioned above. Blood cultures from here did not show any growth. Patient reportedly had 2D echocardiogram done at the acute facility that did not show any vegetations.  Complete treatment with empiric antibiotics as mentioned above.  If she starts having fevers greater than 101 or worsening leukocytosis suggest to send for repeat cultures including pan cultures.    Pneumonia: Respiratory cultures collected on 2 11/03/2020 showed Pseudomonas aeruginosa and few gram-positive cocci in  pairs and clusters on the gram  stain.  On antibiotics as mentioned above.  She also has dysphagia and at risk for aspiration.  Completing treatment with empiric antibiotics.  Currently respiratory status appears to be stable.  She denies having any cough or sputum production at this time.  However, If respiratory status worsens, suggest repeat chest imaging preferably chest CT which can be done without contrast if concern for renal compromise.  She had some diarrhea but C. difficile negative.  She is high risk for C. difficile given the multiple antibiotics that she has received.  If she starts having diarrhea again then suggest to treat her with empiric p.o. vancomycin.  Ischemia with dry gangrene of the digits of the hands and feet: There was some concern of worsening necrosis of the lower extremities.  CT of the feet does not show any evidence of osteomyelitis or any evidence of worsening necrosis or gas producing organisms.  Suggest to discontinue clindamycin.    Completing treatment with empiric antibiotics.  The dry gangrene and ischemia was as a result of the patient being on pressors at the acute facility.  Continue local wound care. -Suggest nitroglycerin gel applied to the digits for local vasodilation and hopefully increasing the perfusion.  DIC/transaminitis: As a result of the sepsis.  Please monitor counts closely.  Further management per the primary team.  Acute hypoxemic respiratory failure: This was likely from the sepsis.  She also was treated for aspiration pneumonia at the acute facility.  Respiratory cultures here showing Pseudomonas.  Antibiotics as mentioned above.  She is currently extubated.  Continue to monitor respiratory status.  As mentioned above, due to her dysphagia she is high risk for recurrent aspiration and worsening respiratory failure despite being on antibiotics.  UTI: Urine culture showed yeast.  She is status post treatment with Diflucan at the acute facility.  Currently denies having any  urinary symptoms.  Continue to monitor.  Coronary disease status post NSTEMI: Continue medication and management per the primary team.  A. fib with RVR: Continue medications and management per primary team.  She is on anticoagulation with Eliquis.  Dysphagia/protein calorie malnutrition: Management per primary team.  Due to the dysphagia she is high risk for recurrent aspiration and aspiration pneumonia despite being on antibiotics.  Plan of care discussed with the patient.  Plan of care also discussed with the primary team and pharmacy.     Subjective: She has some contraction of her fingers, continues to have dry gangrene and discoloration of her fingertips and toes.  Complaining of debility with generalized weakness. Denies having any other complaints at this time.  Objective: Vitals: Temperature 98.3, pulse 86, respiratory rate 14, blood pressure 109/59, pulse oximetry 95%  Examination: Constitutional: Awake and oriented Head: Atraumatic, normocephalic Eyes: PERLA, EOMI, irises appear normal, anicteric sclera,  ENMT: external ears and nose appear normal, normal hearing, Lips appears normal, moist oral mucosa Neck: neck appears normal, no masses  CVS: S1-S2  Respiratory: Decreased breath sound lower lobes, rhonchi, no wheezing.  Abdomen: soft nontender, nondistended, normal bowel sounds, no hepatosplenomegaly, no hernias  Musculoskeletal: She has ischemia with dry gangrene of fingers of both hands, toes Neuro: Debility with generalized weakness otherwise grossly nonfocal Psych: stable mood and affect, mental status Skin: Ischemia of the digits as mentioned above.   Data Reviewed: I have personally reviewed following labs and imaging studies  CBC: Recent Labs  Lab 11/10/20 0338  WBC 9.6  HGB 9.6*  HCT 31.1*  MCV  96.0  PLT 562*    Basic Metabolic Panel: Recent Labs  Lab 11/10/20 0338 11/10/20 0525 11/11/20 0407 11/13/20 0811 11/14/20 0352  NA 138  --   --   --    --   K 2.3*  --  4.3  --   --   CL 101  --   --   --   --   CO2 28  --   --   --   --   GLUCOSE 90  --   --   --   --   BUN <5*  --   --   --   --   CREATININE 0.46  --   --   --   --   CALCIUM 8.0*  --   --   --   --   MG  --  1.6*  --  1.5* 2.1    GFR: CrCl cannot be calculated (Unknown ideal weight.).  Liver Function Tests: No results for input(s): AST, ALT, ALKPHOS, BILITOT, PROT, ALBUMIN in the last 168 hours.  CBG: No results for input(s): GLUCAP in the last 168 hours.   No results found for this or any previous visit (from the past 240 hour(s)).    Radiology Studies: No results found.  Scheduled Meds: Please see MAR  Vonzella Nipple, MD  11/14/2020, 4:43 PM

## 2020-11-15 DIAGNOSIS — I96 Gangrene, not elsewhere classified: Secondary | ICD-10-CM | POA: Diagnosis not present

## 2020-11-15 NOTE — Consult Note (Signed)
ORTHOPAEDIC CONSULTATION  REQUESTING PHYSICIAN: Carron Curie, MD  Chief Complaint: Gangrenous ulcer right heel with gangrene of the toes on both feet  HPI: [name] [name] is a 60 y.o. female who presents with status post sepsis with strep pyogenes.  Secondary to vasopressor medication patient developed gangrene of her fingers toes both feet.  Patient is seen for evaluation and treatment of her gangrenous extremities.  Patient was status postcardiac catheterization in [place] she states that after this she had a dental procedure and developed her strep infection.  No past medical history on file.  Social History   Socioeconomic History  . Marital status: Married    Spouse name: Not on file  . Number of children: Not on file  . Years of education: Not on file  . Highest education level: Not on file  Occupational History  . Not on file  Tobacco Use  . Smoking status: Not on file  . Smokeless tobacco: Not on file  Substance and Sexual Activity  . Alcohol use: Not on file  . Drug use: Not on file  . Sexual activity: Not on file  Other Topics Concern  . Not on file  Social History Narrative  . Not on file   Social Determinants of Health   Financial Resource Strain: Not on file  Food Insecurity: Not on file  Transportation Needs: Not on file  Physical Activity: Not on file  Stress: Not on file  Social Connections: Not on file   No family history on file. - negative except otherwise stated in the family history section Not on File Prior to Admission medications   Not on File   No results found. - pertinent xrays, CT, MRI studies were reviewed and independently interpreted  Positive ROS: All other systems have been reviewed and were otherwise negative with the exception of those mentioned in the HPI and as above.  Physical Exam: General: Alert, no acute distress Psychiatric: Patient is competent for consent with normal mood and affect Lymphatic: No axillary or  cervical lymphadenopathy Cardiovascular: No pedal edema Respiratory: No cyanosis, no use of accessory musculature GI: No organomegaly, abdomen is soft and non-tender    Images:  @ENCIMAGES @  Labs:  Lab Results  Component Value Date   REPTSTATUS 11/06/2020 FINAL 11/03/2020   GRAMSTAIN  11/03/2020    MODERATE WBC PRESENT,BOTH PMN AND MONONUCLEAR FEW GRAM POSITIVE COCCI IN PAIRS IN CLUSTERS RARE YEAST Performed at [facility], [address], [place], [place]    CULT  11/03/2020    NO GROWTH 5 DAYS Performed at [facility], [address], [place], [place]    LABORGA PSEUDOMONAS AERUGINOSA 11/03/2020    Lab Results  Component Value Date   ALBUMIN 1.9 (L) 11/04/2020   ALBUMIN 2.4 (L) 11/02/2020     CBC EXTENDED Latest Ref Rng & Units 11/10/2020 11/07/2020 11/06/2020  WBC 4.0 - 10.5 K/uL 9.6 9.9 9.9  RBC 3.87 - 5.11 MIL/uL 3.24(L) 3.20(L) 3.22(L)  HGB 12.0 - 15.0 g/dL 11/08/2020) 4.6(K) 5.9(D)  HCT 36.0 - 46.0 % 31.1(L) 31.1(L) 31.3(L)  PLT 150 - 400 K/uL 562(H) 502(H) 494(H)  NEUTROABS 1.7 - 7.7 K/uL - - -  LYMPHSABS 0.7 - 4.0 K/uL - - -    Neurologic: Patient does not have protective sensation bilateral lower extremities.   MUSCULOSKELETAL:   Skin: Examination patient has dry gangrene of the fingertips of both hands.  She is developing some contractures in her left hand  and she is working with therapy to resolve the contractures.  All fingertip gangrenous changes appear partial-thickness.  Examination of both feet she has large blistered skin circumferentially about both feet.  The blistered skin was removed there is healthy granulation tissue beneath this blister.  There did appear to be a purulent drainage from the blister but this appears superficial.  Patient also has ischemic gangrenous changes to her toes and a partial thickness ischemic ulcer to the right heel.  There appears to be good turgor and her heel and I think that patient has  sufficient circulation to continue observation.  Patient has a strong dorsalis pedis pulse bilaterally.  Patient does have nutritional insufficiency with an albumin of 1.9.  Her white blood cell count is stable at 9.6 and hemoglobin is also stable at 9.6.          Assessment: Partial-thickness gangrenous changes to both feet worse on the right heel with partial thickness gangrenous changes of her fingertips on both hands.  Plan: I feel the patient has sufficient microcirculation to continue observation and has a chance to heal most of these ulcers.  I have placed an order for Vive Wear compression stockings I talked with [facility] and they state they do have a contract with [facility] and they will bring by 3 pair of the extra-large compression stockings.  The socks should be worn 24 hours a day directly against the skin do not apply any dressing between the skin and the sock.  The sock is a medicated dressing.  Sock should be changed daily and a new sock applied daily.  The socks may be washed in normal laundry and dried as well.    I will follow-up next Friday to evaluate patient's progress.  Thank you for the consult and the opportunity to see [name], MD [facility] [phone] 2:40 PM

## 2020-11-18 LAB — CBC
HCT: 35.6 % — ABNORMAL LOW (ref 36.0–46.0)
Hemoglobin: 11 g/dL — ABNORMAL LOW (ref 12.0–15.0)
MCH: 30.1 pg (ref 26.0–34.0)
MCHC: 30.9 g/dL (ref 30.0–36.0)
MCV: 97.5 fL (ref 80.0–100.0)
Platelets: 326 10*3/uL (ref 150–400)
RBC: 3.65 MIL/uL — ABNORMAL LOW (ref 3.87–5.11)
RDW: 17.8 % — ABNORMAL HIGH (ref 11.5–15.5)
WBC: 9.4 10*3/uL (ref 4.0–10.5)
nRBC: 0 % (ref 0.0–0.2)

## 2020-11-18 LAB — COMPREHENSIVE METABOLIC PANEL
ALT: 17 U/L (ref 0–44)
AST: 22 U/L (ref 15–41)
Albumin: 2.4 g/dL — ABNORMAL LOW (ref 3.5–5.0)
Alkaline Phosphatase: 87 U/L (ref 38–126)
Anion gap: 8 (ref 5–15)
BUN: <5 mg/dL — ABNORMAL LOW (ref 6–20)
CO2: 31 mmol/L (ref 22–32)
Calcium: 8.9 mg/dL (ref 8.9–10.3)
Chloride: 99 mmol/L (ref 98–111)
Creatinine, Ser: 0.43 mg/dL — ABNORMAL LOW (ref 0.44–1.00)
GFR, Estimated: >60 mL/min (ref >60)
Glucose, Bld: 103 mg/dL — ABNORMAL HIGH (ref 70–99)
Potassium: 3.5 mmol/L (ref 3.5–5.1)
Sodium: 138 mmol/L (ref 135–145)
Total Bilirubin: 0.6 mg/dL (ref 0.3–1.2)
Total Protein: 6.8 g/dL (ref 6.5–8.1)

## 2020-11-18 LAB — SEDIMENTATION RATE: Sed Rate: 108 mm/hr — ABNORMAL HIGH (ref 0–22)

## 2020-11-18 LAB — PROCALCITONIN: Procalcitonin: <0.1 ng/mL

## 2020-11-18 NOTE — PMR Pre-admission (Signed)
PMR Admission Coordinator Pre-Admission Assessment  Patient: [name] [name] is an 60 y.o., female MRN: [id number] DOB: 1960-12-06 Height:   Weight:    Insurance Information HMO:     PPO:      PCP:      IPA:      80/20:      OTHER:  PRIMARY: [place]      Policy#: [id number]      Subscriber: [name] [name] CM Name:   [name] [name]    Phone#:  [phone]    Fax#: [phone] Pre-Cert#: [id number] approved for 7 days with f/u [name] [name] phone [phone] ext [phone] fax [phone].      Employer:  Benefits:  Phone #: [email or web address]     Name:5/10 Eff Date: 07/13/2020- 07/12/2021 Deductible: does not have one OOP Max: $6,850 ($6,850 met) CIR: 80% coverage, 20% co-insurance SNF: 80% coverage, 20% co-insurance after deductible met, Limited to 150 Days Per Calendar Year combined with [place]. Outpatient:  80% coverage, 20% co-insurance after deductible met, (review after 40 visits for medical necessity) Home Health: 80% coverage, 20% co-insurance after deductible met; There are no visit limits for home health care. DME: 80% coverage, 20% co-insurance after deductible met Providers: in-network  SECONDARY:       Policy#:      Phone#:   Development worker, community:       Phone#:   [employer], [employer]" for patients in Inpatient Rehabilitation Facilities with attached "Privacy Act [place] Records" was provided and verbally reviewed with: N/A  Emergency Contact Information Contact Information    Name Relation Home Work Mobile   [name],[name] Spouse   [phone]      Current Medical History  Patient Admitting Diagnosis: debility, Limb ischemia   History of Present Illness:  60 year old female with history of psoriatic arthritis, anti-interleukin-17A antibody, hypertension, CAD, and NSTEMI who was admitted to acute hospital 10/08/2020 with streptococcus pyogenes sepsis, DIC with complicating acute renal failure necessitating  CRRT. The patient received HD during that admission and underwent thoracentesis. No longer on CRRT or Hemodialysis. Infectious disease placed her on Clinda, Levaquin, and Flagyl. Completed a course of IVIG. She developed respiratory failure and was subsequently intubated, then extubated to 2 L via nasal cannula. CXR revealed RLL PNA, which was treated. While on pressors in the ICU, patient developed digital ischemia secondary to sepsis/disseminated intravascular coagulation .  Stabilized and admitted to [facility] on 11/01/2020 with Digital/limb ischemia with consultation of Dr. Sharol Given with orthopedics. Compression wear for all bilateral LEs.  On [facility] her antibiotics were changed to Vancomycin and Zosyn with improvement in leukocytosis and fever. ID consultedl and she was switched to Vanc, cefepime Flagyl per their recommendations.  Patient weaned to room air, receiving wound care at [facility], and working with therapies to regain strength and independence.    Patient's medical record from [facility] has been reviewed by the rehabilitation admission coordinator and physician.  Past Medical History  Psoriatic arthritis, anti-interleukin-17a antibody, HTN and CAD s/p STEMI  Family History   family history is not on file.  Prior Rehab/Hospitalizations Has the patient had prior rehab or hospitalizations prior to admission? Yes  Has the patient had major surgery during 100 days prior to admission? yes   Current Medications Digestive advantage capsule Cardizem Eliquis Lexapro Gabapentin Lopressor Nutrisource Nystatin Protonix KCL Scopolamine;amin patch Multivitamin  Patients Current Diet:  Diet Order            DIET DYS 3 Room  service appropriate? Yes; Fluid consistency: Thin  Diet effective now                 Precautions / Restrictions   Fall  Has the patient had 2 or more falls or a fall with injury in the past year? No  Prior Activity  Level Community (5-7x/wk): driving, working. Patient cared for her adopted 25 year old grandson  Prior Functional Level Self Care: Did the patient need help bathing, dressing, using the toilet or eating? Independent  Indoor Mobility: Did the patient need assistance with walking from room to room (with or without device)? Independent  Stairs: Did the patient need assistance with internal or external stairs (with or without device)? Independent  Functional Cognition: Did the patient need help planning regular tasks such as shopping or remembering to take medications? [place] / Equipment  none  Prior Device Use: Indicate devices/aids used by the patient prior to current illness, exacerbation or injury? None of the above  Current Functional Level Cognition  [facility]    Extremity Assessment (includes Sensation/Coordination)  Pt. With decreased sensation, strength, and coordination with all extremities.     ADLs   Pt. Mod A with upper body ADLS, max A with lower body ADLs   Mobility   Pt. Mod A with bed mobility   Transfers   Max A    Ambulation / Gait / Stairs / Wheelchair Mobility  Not attempted   Posture / Balance Pt. Working toward endurance with standing. On 5/11, she stood and was able to bear weight,    Special needs/care consideration Skin Pt. with wounds to bilateral hands and feet with daily dressing changes and compression stockings 24/7   Previous Home Environment   Lives With: Spouse Available Help at Discharge: Family,Available 24 hours/day Type of Home: House Home Layout: One level Home Access: Level entry Bathroom Shower/Tub: Tub/shower Psychologist, occupational: Standard Bathroom Accessibility: Yes How Accessible: Accessible via walker [place]: No  Discharge Living Setting Plans for Discharge Living Setting: Patient's home Type of Home at Discharge: House Discharge Home Layout: One level Discharge Home  Access: Level entry Discharge Bathroom Shower/Tub: Tub/shower unit,Walk-in shower Discharge Bathroom Toilet: Standard Discharge Bathroom Accessibility: Yes How Accessible: Accessible via walker Does the patient have any problems obtaining your medications?: No  Social/Family/Support Systems Anticipated Caregiver: [name] [name], husband and pt's sisters Anticipated Caregiver's Contact Information: [name] Caregiver Availability: 24/7 Discharge Plan Discussed with Primary Caregiver: Yes Is Caregiver In Agreement with Plan?: Yes Does Caregiver/Family have Issues with Lodging/Transportation while Pt is in Rehab?: No   Patient's daughter died 06-09-2020 from unintentional drug overdose. Other family members have died also in past 6 months. Patient is very emotional and would benefit from counseling  Goals Patient/Family Goal for Rehab: PT/OT Min A, SLP mod I  Expected length of stay: 2 to 3 weeks Pt/Family Agrees to Admission and willing to participate: Yes Program Orientation Provided & Reviewed with Pt/Caregiver Including Roles  & Responsibilities: Yes  Decrease burden of Care through IP rehab admission: NA  Possible need for SNF placement upon discharge: Not anticipated  Patient Condition: I have reviewed medical records from [facility] , spoken with CSW, and patient and spouse. I met with patient at the bedside for inpatient rehabilitation assessment.  Patient will benefit from ongoing PT, OT and SLP, can actively participate in 3 hours of therapy a day 5 days of the week, and can make measurable gains during  the admission.  Patient will also benefit from the coordinated team approach during an Inpatient Acute Rehabilitation admission.  The patient will receive intensive therapy as well as Rehabilitation physician, nursing, social worker, and care management interventions.  Due to bladder management, bowel management, safety, skin/wound care, disease management,  medication administration, pain management and patient education the patient requires 24 hour a day rehabilitation nursing.  The patient is currently max assist overall with mobility and basic ADLs.  Discharge setting and therapy post discharge at home with home health is anticipated.  Patient has agreed to participate in the Acute Inpatient Rehabilitation Program and will admit today.  Preadmission Screen Completed By: Danne Baxter RN MSN 11/22/2020 at 1055 ______________________________________________________________________   Discussed status with Dr. Posey Pronto  on  11/22/2020 at  61 and received approval for admission today.  Admission Coordinator:  Danne Baxter RN MSN, time  1100 Date  11/22/2020   Assessment/Plan: 1. Diagnosis: Debility 2. Does the need for close, 24 hr/day Medical supervision in concert with the patient's rehab needs make it unreasonable for this patient to be served in a less intensive setting? Yes  3. Co-Morbidities requiring supervision/potential complications: psoriatic arthritis, anti-interleukin-17A antibody, HTN (monitor and provide prns in accordance with increased physical exertion and pain), CAD, and NSTEMI, DIC with complicating acute renal failure necessitating CRRT 4. Due to safety, skin/wound care, disease management, pain management and patient education, does the patient require 24 hr/day rehab nursing? Yes 5. Does the patient require coordinated care of a physician, rehab nurse, PT, OT to address physical and functional deficits in the context of the above medical diagnosis(es)? Yes Addressing deficits in the following areas: balance, endurance, locomotion, strength, transferring, bathing, dressing, toileting and psychosocial support 6. Can the patient actively participate in an intensive therapy program of at least 3 hrs of therapy 5 days a week? Yes 7. The potential for patient to make measurable gains while on inpatient rehab is excellent 8. Anticipated  functional outcomes upon discharge from inpatient rehab: min assist PT, min assist OT, n/a SLP 9. Estimated rehab length of stay to reach the above functional goals is: 19-23 days. 10. Anticipated discharge destination: Home 10. Overall Rehab/Functional Prognosis: good   MD Signature: Delice Lesch, MD, ABPMR

## 2020-11-19 NOTE — Progress Notes (Signed)
The patient is a 60 year old woman with a history of sepsis and severe illness requiring pressor support.  As a result she has gangrenous changes of her feet and fingers.  Dr. Lajoyce Corners was consulted.  He recommended placement of medical compression socks.   Socks are in place and fitting well.  There is some drainage on the right sock on the inside.  Patient seems to be tolerating them well.  I explained to her that these will be changed daily.  While it is painful she understands it is for limb salvage and is willing to go through this.  Will need daily changing of the socks with washing of the dirty pair.  We will have regular outpatient follow-up in our office when ready for discharge

## 2020-11-21 LAB — SARS CORONAVIRUS 2 (TAT 6-24 HRS): SARS Coronavirus 2: NEGATIVE

## 2020-11-22 ENCOUNTER — Encounter (HOSPITAL_COMMUNITY): Payer: Self-pay | Admitting: Physical Medicine and Rehabilitation

## 2020-11-22 ENCOUNTER — Inpatient Hospital Stay (HOSPITAL_COMMUNITY)
Admission: RE | Admit: 2020-11-22 | Discharge: 2020-12-23 | DRG: 945 | Disposition: A | Discharge status: Home Health | Payer: 59 | Source: Intra-hospital | Attending: Physical Medicine and Rehabilitation | Admitting: Physical Medicine and Rehabilitation

## 2020-11-22 ENCOUNTER — Encounter: Payer: Self-pay | Admitting: Internal Medicine

## 2020-11-22 DIAGNOSIS — G7281 Critical illness myopathy: Secondary | ICD-10-CM | POA: Diagnosis not present

## 2020-11-22 DIAGNOSIS — F32A Depression, unspecified: Secondary | ICD-10-CM | POA: Diagnosis present

## 2020-11-22 DIAGNOSIS — R1310 Dysphagia, unspecified: Secondary | ICD-10-CM

## 2020-11-22 DIAGNOSIS — L089 Local infection of the skin and subcutaneous tissue, unspecified: Secondary | ICD-10-CM | POA: Diagnosis present

## 2020-11-22 DIAGNOSIS — G629 Polyneuropathy, unspecified: Secondary | ICD-10-CM | POA: Diagnosis present

## 2020-11-22 DIAGNOSIS — M24571 Contracture, right ankle: Secondary | ICD-10-CM | POA: Diagnosis present

## 2020-11-22 DIAGNOSIS — Z823 Family history of stroke: Secondary | ICD-10-CM

## 2020-11-22 DIAGNOSIS — R5381 Other malaise: Principal | ICD-10-CM | POA: Diagnosis present

## 2020-11-22 DIAGNOSIS — N898 Other specified noninflammatory disorders of vagina: Secondary | ICD-10-CM | POA: Diagnosis present

## 2020-11-22 DIAGNOSIS — M24572 Contracture, left ankle: Secondary | ICD-10-CM | POA: Diagnosis present

## 2020-11-22 DIAGNOSIS — Z79899 Other long term (current) drug therapy: Secondary | ICD-10-CM | POA: Diagnosis not present

## 2020-11-22 DIAGNOSIS — M24541 Contracture, right hand: Secondary | ICD-10-CM | POA: Diagnosis present

## 2020-11-22 DIAGNOSIS — D62 Acute posthemorrhagic anemia: Secondary | ICD-10-CM | POA: Diagnosis present

## 2020-11-22 DIAGNOSIS — I2699 Other pulmonary embolism without acute cor pulmonale: Secondary | ICD-10-CM | POA: Diagnosis not present

## 2020-11-22 DIAGNOSIS — I10 Essential (primary) hypertension: Secondary | ICD-10-CM | POA: Diagnosis present

## 2020-11-22 DIAGNOSIS — L4050 Arthropathic psoriasis, unspecified: Secondary | ICD-10-CM | POA: Diagnosis present

## 2020-11-22 DIAGNOSIS — Z7901 Long term (current) use of anticoagulants: Secondary | ICD-10-CM | POA: Diagnosis not present

## 2020-11-22 DIAGNOSIS — N390 Urinary tract infection, site not specified: Secondary | ICD-10-CM | POA: Diagnosis present

## 2020-11-22 DIAGNOSIS — Z86711 Personal history of pulmonary embolism: Secondary | ICD-10-CM

## 2020-11-22 DIAGNOSIS — I480 Paroxysmal atrial fibrillation: Secondary | ICD-10-CM

## 2020-11-22 DIAGNOSIS — I96 Gangrene, not elsewhere classified: Secondary | ICD-10-CM | POA: Diagnosis present

## 2020-11-22 DIAGNOSIS — F329 Major depressive disorder, single episode, unspecified: Secondary | ICD-10-CM | POA: Diagnosis present

## 2020-11-22 DIAGNOSIS — G5793 Unspecified mononeuropathy of bilateral lower limbs: Secondary | ICD-10-CM

## 2020-11-22 DIAGNOSIS — Z801 Family history of malignant neoplasm of trachea, bronchus and lung: Secondary | ICD-10-CM | POA: Diagnosis not present

## 2020-11-22 DIAGNOSIS — Z6832 Body mass index (BMI) 32.0-32.9, adult: Secondary | ICD-10-CM

## 2020-11-22 DIAGNOSIS — M21372 Foot drop, left foot: Secondary | ICD-10-CM | POA: Diagnosis present

## 2020-11-22 DIAGNOSIS — F419 Anxiety disorder, unspecified: Secondary | ICD-10-CM | POA: Diagnosis present

## 2020-11-22 DIAGNOSIS — I252 Old myocardial infarction: Secondary | ICD-10-CM

## 2020-11-22 DIAGNOSIS — M24542 Contracture, left hand: Secondary | ICD-10-CM | POA: Diagnosis present

## 2020-11-22 DIAGNOSIS — Z8249 Family history of ischemic heart disease and other diseases of the circulatory system: Secondary | ICD-10-CM | POA: Diagnosis not present

## 2020-11-22 DIAGNOSIS — E669 Obesity, unspecified: Secondary | ICD-10-CM | POA: Diagnosis present

## 2020-11-22 DIAGNOSIS — L24A9 Irritant contact dermatitis due friction or contact with other specified body fluids: Secondary | ICD-10-CM

## 2020-11-22 MED ORDER — ALUM & MAG HYDROXIDE-SIMETH 200-200-20 MG/5ML PO SUSP: 30.0000 mL | ORAL | Status: DC | PRN

## 2020-11-22 MED ORDER — ASCORBIC ACID 500 MG PO TABS
250.0000 mg | ORAL_TABLET | Freq: Two times a day (BID) | ORAL | Status: DC
  Administered 2020-11-22 – 2020-12-23 (×62): 250 mg via ORAL
  Filled 2020-11-22 (×63): qty 1

## 2020-11-22 MED ORDER — DIPHENHYDRAMINE HCL 12.5 MG/5ML PO ELIX: 12.5000 mg | ORAL_SOLUTION | Freq: Four times a day (QID) | ORAL | Status: DC | PRN

## 2020-11-22 MED ORDER — FAMOTIDINE 20 MG PO TABS: 10.0000 mg | ORAL_TABLET | Freq: Two times a day (BID) | ORAL | Status: DC | PRN

## 2020-11-22 MED ORDER — PROCHLORPERAZINE EDISYLATE 10 MG/2ML IJ SOLN: 5.0000 mg | Freq: Four times a day (QID) | INTRAMUSCULAR | Status: DC | PRN

## 2020-11-22 MED ORDER — PROCHLORPERAZINE 25 MG RE SUPP: 12.5000 mg | Freq: Four times a day (QID) | RECTAL | Status: DC | PRN

## 2020-11-22 MED ORDER — NYSTATIN 100000 UNIT/ML MT SUSP
5.0000 mL | Freq: Four times a day (QID) | OROMUCOSAL | Status: DC
  Administered 2020-11-22 – 2020-11-23 (×5): 500000 [IU] via ORAL
  Filled 2020-11-22 (×5): qty 5

## 2020-11-22 MED ORDER — GUAIFENESIN-DM 100-10 MG/5ML PO SYRP: 5.0000 mL | ORAL_SOLUTION | Freq: Four times a day (QID) | ORAL | Status: DC | PRN

## 2020-11-22 MED ORDER — POLYETHYLENE GLYCOL 3350 17 G PO PACK: 17.0000 g | PACK | Freq: Every day | ORAL | Status: DC | PRN

## 2020-11-22 MED ORDER — CLONAZEPAM 0.5 MG PO TABS
0.5000 mg | ORAL_TABLET | Freq: Two times a day (BID) | ORAL | Status: DC | PRN
  Administered 2020-11-22 – 2020-12-21 (×6): 0.5 mg via ORAL
  Filled 2020-11-22 (×7): qty 1

## 2020-11-22 MED ORDER — ADULT MULTIVITAMIN W/MINERALS CH
1.0000 | ORAL_TABLET | Freq: Every day | ORAL | Status: DC
  Administered 2020-11-23 – 2020-12-23 (×31): 1 via ORAL
  Filled 2020-11-22 (×34): qty 1

## 2020-11-22 MED ORDER — ACETAMINOPHEN 325 MG PO TABS
325.0000 mg | ORAL_TABLET | ORAL | Status: DC | PRN
  Administered 2020-11-22 – 2020-12-02 (×4): 650 mg via ORAL
  Filled 2020-11-22 (×5): qty 2

## 2020-11-22 MED ORDER — PANTOPRAZOLE SODIUM 40 MG PO PACK: 40.0000 mg | PACK | Freq: Every day | ORAL | Status: DC

## 2020-11-22 MED ORDER — GABAPENTIN 300 MG PO CAPS
300.0000 mg | ORAL_CAPSULE | Freq: Every day | ORAL | Status: DC
  Administered 2020-11-22: 300 mg via ORAL
  Filled 2020-11-22: qty 1

## 2020-11-22 MED ORDER — DILTIAZEM HCL 60 MG PO TABS
60.0000 mg | ORAL_TABLET | Freq: Four times a day (QID) | ORAL | Status: DC
  Administered 2020-11-22 – 2020-12-09 (×61): 60 mg via ORAL
  Filled 2020-11-22 (×65): qty 1

## 2020-11-22 MED ORDER — APIXABAN 5 MG PO TABS
5.0000 mg | ORAL_TABLET | Freq: Two times a day (BID) | ORAL | Status: DC
  Administered 2020-11-22 – 2020-12-23 (×62): 5 mg via ORAL
  Filled 2020-11-22 (×64): qty 1

## 2020-11-22 MED ORDER — ZINC SULFATE 220 (50 ZN) MG PO CAPS
220.0000 mg | ORAL_CAPSULE | Freq: Every day | ORAL | Status: DC
  Administered 2020-11-23 – 2020-12-23 (×31): 220 mg via ORAL
  Filled 2020-11-22 (×32): qty 1

## 2020-11-22 MED ORDER — BOOST / RESOURCE BREEZE PO LIQD CUSTOM
1.0000 | Freq: Three times a day (TID) | ORAL | Status: DC
  Administered 2020-11-23 – 2020-12-19 (×15): 1 via ORAL

## 2020-11-22 MED ORDER — BISACODYL 10 MG RE SUPP: 10.0000 mg | Freq: Every day | RECTAL | Status: DC | PRN

## 2020-11-22 MED ORDER — METOPROLOL TARTRATE 12.5 MG HALF TABLET
12.5000 mg | ORAL_TABLET | Freq: Two times a day (BID) | ORAL | Status: DC
  Administered 2020-11-22 – 2020-12-23 (×59): 12.5 mg via ORAL
  Filled 2020-11-22 (×63): qty 1

## 2020-11-22 MED ORDER — POTASSIUM CHLORIDE CRYS ER 20 MEQ PO TBCR
20.0000 meq | EXTENDED_RELEASE_TABLET | Freq: Every day | ORAL | Status: DC
  Administered 2020-11-23 – 2020-12-23 (×31): 20 meq via ORAL
  Filled 2020-11-22 (×34): qty 1

## 2020-11-22 MED ORDER — ESCITALOPRAM OXALATE 10 MG PO TABS
10.0000 mg | ORAL_TABLET | Freq: Every day | ORAL | Status: DC
  Administered 2020-11-23 – 2020-11-25 (×3): 10 mg via ORAL
  Filled 2020-11-22 (×3): qty 1

## 2020-11-22 MED ORDER — HYDROCERIN EX CREA: TOPICAL_CREAM | Freq: Two times a day (BID) | CUTANEOUS | Status: DC

## 2020-11-22 MED ORDER — PROCHLORPERAZINE MALEATE 5 MG PO TABS
5.0000 mg | ORAL_TABLET | Freq: Four times a day (QID) | ORAL | Status: DC | PRN
  Administered 2020-11-29 – 2020-12-15 (×3): 10 mg via ORAL
  Filled 2020-11-22 (×3): qty 2
  Filled 2020-11-22: qty 1

## 2020-11-22 MED ORDER — FLEET ENEMA 7-19 GM/118ML RE ENEM: 1.0000 | ENEMA | Freq: Once | RECTAL | Status: DC | PRN

## 2020-11-22 MED ORDER — TRAZODONE HCL 50 MG PO TABS
25.0000 mg | ORAL_TABLET | Freq: Every evening | ORAL | Status: DC | PRN
  Administered 2020-11-24 – 2020-12-22 (×6): 50 mg via ORAL
  Filled 2020-11-22 (×8): qty 1

## 2020-11-22 MED ORDER — MELATONIN 3 MG PO TABS
3.0000 mg | ORAL_TABLET | Freq: Every day | ORAL | Status: DC
  Administered 2020-11-22 – 2020-12-22 (×31): 3 mg via ORAL
  Filled 2020-11-22 (×32): qty 1

## 2020-11-22 MED ORDER — SCOPOLAMINE 1 MG/3DAYS TD PT72
1.0000 | MEDICATED_PATCH | TRANSDERMAL | Status: DC
  Administered 2020-11-25 – 2020-12-13 (×7): 1.5 mg via TRANSDERMAL
  Filled 2020-11-22 (×7): qty 1

## 2020-11-22 MED ORDER — PROSOURCE PLUS PO LIQD
30.0000 mL | Freq: Two times a day (BID) | ORAL | Status: DC
  Administered 2020-11-23 – 2020-12-20 (×51): 30 mL via ORAL
  Filled 2020-11-22 (×57): qty 30

## 2020-11-22 NOTE — H&P (Signed)
Physical Medicine and Rehabilitation Admission H&P    CC: Weakness  HPI: [name] [name] is a 60 year old female with history of HTN, psoriatic arthritis (anti-interleukin 17 A antibody), major depression with anxiety d/o who was admitted to [facility] on 09/21/2020 with septic shock due to strep pyogenes. History taken from chart review and patient. Hospital course significant for VDRF requiring intubation, acute renal failure s/p CRRT, diarrhea, NSTEMI, aspiration PNA, DIC with ischemia of fingers/BLE, PE, thrombocytopenia and anemia. She was treated with multiple antibiotics as well as IVIG, extubated to [place] and remained NPO with tube feeds for nutritional support. . She continued to have leucocytosis with fever treated with Tygacil, flagyl and micafungin and was discharged to [facility] on 11/01/2020 for further management.  She continued to have issues with leucocytosis and fevers with delirium. Antibiotics changed to IV Vanc, Flagyl and Cefepime on 04/24-->narrowed to cefepime and Flagyl on 04/27 X 2 weeks.  Metoprolol titrated upwards and amiodarone weaned off per input from Dr. Algie Coffer. Klonopin added to help manage anxiety and diet slowly advanced to D3, thins--NO straws by 05/03.  ID/Dr. Macky Lower consulted for input due to SOB and fevers. CT of left foot and hand showed diffuse SQ edema without evidence of osteomyelitis. Dr. Lajoyce Corners consulted for input on 05/05 and recommended local care with Viva compression socks to be worn 24 hours and laundered daily. 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 CIR recommended due to functional decline. Please see preadmission assessment from earlier today as well.   Review of Systems  Constitutional: Negative for chills and fever.  HENT: Negative for hearing loss and tinnitus.   Eyes: Negative for blurred vision and double vision.  Respiratory: Negative for cough and shortness of  breath.   Cardiovascular: Negative for chest pain and palpitations.  Gastrointestinal: Positive for constipation. Negative for heartburn and nausea.  Genitourinary: Negative for dysuria and urgency.  Musculoskeletal: Positive for joint pain and myalgias. Negative for back pain and neck pain.  Skin: Negative for itching and rash.  Neurological: Positive for sensory change and weakness. Negative for dizziness and headaches.  Psychiatric/Behavioral: The patient is nervous/anxious and has insomnia.   All other systems reviewed and are negative.    Past Medical History:  Diagnosis Date  . Depression   . Generalized anxiety disorder   . NSTEMI (non-ST elevated myocardial infarction) [facility])      Past Surgical History:  Procedure Laterality Date  . CARDIAC CATHETERIZATION  09/2020     Family History  Problem Relation Age of Onset  . Lung cancer Mother   . Stroke Father   . Heart attack Maternal Grandmother    Social History:  Married. 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 She has an unknown smoking status. She has never used smokeless tobacco. She reports previous alcohol use. She reports that she does not use drugs.   Allergies: No Known Allergies   No medications prior to admission.    Drug Regimen Review  Drug regimen was reviewed and remains appropriate with no significant issues identified  Home: Home Living Available Help at Discharge: Family,Available 24 hours/day Type of Home: House Home Access: Level entry Home Layout: One level Bathroom Shower/Tub: Tub/shower Surveyor, minerals: Standard Bathroom Accessibility: Yes  Lives With: Spouse   Functional History: Independent without AD prior to admission.   Functional Status:  Mobility: Mod  assist bed mobility Mod assist for standing attempts.     ADL: Mod assist with UB care Max assist with LB care  Cognition:       There were no vitals  taken for this visit. Physical Exam Vitals and nursing note reviewed.  Constitutional:      General: She is not in acute distress.    Appearance: Normal appearance.     Comments: pleasant and appropriate but got teary at times.     HENT:     Head: Normocephalic and atraumatic.     Right Ear: External ear normal.     Left Ear: External ear normal.     Nose: Nose normal.  Eyes:     General:        Right eye: No discharge.        Left eye: No discharge.     Extraocular Movements: Extraocular movements intact.  Cardiovascular:     Rate and Rhythm: Normal rate and regular rhythm.  Pulmonary:     Effort: Pulmonary effort is normal. No respiratory distress.     Breath sounds: No stridor.  Abdominal:     General: Abdomen is flat. Bowel sounds are normal. There is no distension.  Musculoskeletal:        General: Swelling (2+ pedal edema) and tenderness (Bilateral LE) present.     Cervical back: Normal range of motion and neck supple.     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 Dorsal surface left hand with new skin. BLE with compressive dressing with dried blood. .   Skin:    Comments: Dry gangrene on B/l UE, R>L distal digits 1-4 LE with dressing CDI  Neurological:     Mental Status: She is alert and oriented to person, place, and time.     Comments: Alert Motor: B/l UE: Shoulder abduction, elbow flexion/extention 4/5, hand limited to due pain.  RLE: HF, KE 4/5, ADF 1/5 (some pain inhibition) LLE: HF, KE 4/5, ADF 3/5 (some pain inhibition)  Psychiatric:        Mood and Affect: Mood normal.        Behavior: Behavior normal.     Results for orders placed or performed during the hospital encounter of 11/01/20 (from the past 48 hour(s))  SARS CORONAVIRUS 2 (TAT 6-24 HRS) Nasopharyngeal Nasopharyngeal Swab     Status: None   Collection Time: 11/21/20  1:11 PM   Specimen: Nasopharyngeal Swab  Result Value Ref Range   SARS Coronavirus 2 NEGATIVE  NEGATIVE    Comment: (NOTE) SARS-CoV-2 target nucleic acids are NOT DETECTED.  The SARS-CoV-2 RNA is generally detectable in upper and lower respiratory specimens during the acute phase of infection. Negative results do not preclude SARS-CoV-2 infection, do not rule out co-infections with other pathogens, and should not be used as the sole basis for treatment or other patient management decisions. Negative results must be combined with clinical observations, patient history, and epidemiological information. The expected result is Negative.  Fact Sheet for Patients: HairSlick.no  Fact Sheet for Healthcare Providers: quierodirigir.com  This test is not yet approved or cleared by the [place] FDA and  has been authorized for detection and/or diagnosis of SARS-CoV-2 by FDA under an Emergency Use Authorization (EUA). This EUA will remain  in effect (meaning this test can be used) for the duration of the COVID-19 declaration under Se ction 564(b)(1) of the Act, 21 U.S.C. section 360bbb-3(b)(1), unless the authorization is terminated or revoked sooner.  Performed at [facility], [address] [place], [place] [address]    No results found.   Medical Problem List and Plan: 1.  Deficits with endurance, mobility, transfers, self-care secondary to debility.   -patient may not shower  -ELOS/Goals: 17-20 days/Min A  Admit to CIR 2.  PE/Antithrombotics: -DVT/anticoagulation:  Pharmaceutical: Other (comment)--Eliquis  -antiplatelet therapy: N/A 3. Pain Management: Oxycodone prn.   Monitor with increased exertion 4. Mood: LCSW to follow for evaluation and support. Team to offer ego support.   -antipsychotic agents: N/A 5. Neuropsych: This patient is capable of making decisions on her own behalf. 6. Skin/Wound Care:   Prevalon boots for use when in bed. Continue left hand splint.   --Darco shoe for weight bearing per  PT input.  7. Fluids/Electrolytes/Nutrition: Monitor  I/Os  CMP ordered  Added protein  supplements to help promote wound healing.  8.  PAF: Monitor HR tid--continue Metoprolol and Cardizem for rate control.   Monitor with increase activity.  9. Dysphagia: Continue aspiration precautions--No straws.    Advance diet as tolerated 10. ABLA  CBC ordered  Jacquelynn Cree, PA-C 11/22/2020  I have personally performed a face to face diagnostic evaluation, including, but not limited to relevant history and physical exam findings, of this patient and developed relevant assessment and plan.  Additionally, I have reviewed and concur with the physician assistant's documentation above.  Maryla Morrow, MD, ABPMR

## 2020-11-22 NOTE — Progress Notes (Signed)
Jamse Arn, MD  Physician  Physical Medicine and Rehabilitation  PMR Pre-admission      Signed  Date of Service:  11/18/2020  5:24 PM      Related encounter: Admission (Discharged) from 11/01/2020 in [place] all _0 Manual_1 Template_2 Copied  Added by: _3 Cristina Gong, RN_4 Lind Covert, Lauren Mamie Nick, CCC-SLP_5 Patel, Ankit Maurene Capes, MD_6 Genella Mech, CCC-SLP   _7 Hover for details  PMR Admission Coordinator Pre-Admission Assessment   Patient: [name] [name] is an 60 y.o., female MRN: [id number] DOB: 01-11-1961 Height:   Weight:     Insurance Information HMO:     PPO:      PCP:      IPA:      80/20:      OTHER:  PRIMARY: [place]      Policy#: [id number]      Subscriber: [name] [name] CM Name:   Elisabeth Pigeon    Phone#:  [phone]    Fax#: [phone] Pre-Cert#: [id number] approved for 7 days with f/u Riley Lam phone [phone] ext [phone] fax [phone].      Employer:  Benefits:  Phone #: [email or web address]     Name:5/10 Eff Date: 07/13/2020- 07/12/2021 Deductible: does not have one OOP Max: $6,850 ($6,850 met) CIR: 80% coverage, 20% co-insurance SNF: 80% coverage, 20% co-insurance after deductible met, Limited to 150 Days Per Calendar Year combined with [place] [facility] [place]. Outpatient:  80% coverage, 20% co-insurance after deductible met, (review after 40 visits for medical necessity) Home Health: 80% coverage, 20% co-insurance after deductible met; There are no visit limits for home health care. DME: 80% coverage, 20% co-insurance after deductible met Providers: in-network  SECONDARY:       Policy#:      Phone#:    Development worker, community:       Phone#:    The Engineer, petroleum" for patients in Inpatient Rehabilitation Facilities with attached "Privacy Act [place] Records" was provided and verbally reviewed with: N/A   Emergency Contact  Information         Contact Information     Name Relation Home Work Mobile    Pollok,[name] [name]     [phone]         Current Medical History  Patient Admitting Diagnosis: debility, Limb ischemia    History of Present Illness:  60 year old female with history of psoriatic arthritis, anti-interleukin-17A antibody, hypertension, CAD, and NSTEMI who was admitted to acute hospital 10/08/2020 with streptococcus pyogenes sepsis, DIC with complicating acute renal failure necessitating CRRT. The patient received HD during that admission and underwent thoracentesis. No longer on CRRT or Hemodialysis. Infectious disease placed her on Clinda, Levaquin, and Flagyl. Completed a course of IVIG. She developed respiratory failure and was subsequently intubated, then extubated to 2 L via nasal cannula. CXR revealed RLL PNA, which was treated. While on pressors in the ICU, patient developed digital ischemia secondary to sepsis/disseminated intravascular coagulation .   Stabilized and admitted to [facility] on 11/01/2020 with Digital/limb ischemia with consultation of Dr. Sharol Given with orthopedics. Compression wear for all bilateral LEs.  On [facility] her antibiotics were changed to Vancomycin and Zosyn with improvement in leukocytosis and fever. ID consultedl and she was switched to Vanc, cefepime Flagyl per their recommendations.  Patient weaned to room air, receiving wound care at [facility], and working with therapies to regain strength and independence.     Patient's medical record  from [facility] has been reviewed by the rehabilitation admission coordinator and physician.   Past Medical History  Psoriatic arthritis, anti-interleukin-17a antibody, HTN and CAD s/p STEMI   Family History   family history is not on file.   Prior Rehab/Hospitalizations Has the patient had prior rehab or hospitalizations prior to admission? Yes   Has the patient had major surgery during 100 days prior to  admission? yes              Current Medications Digestive advantage capsule Cardizem Eliquis Lexapro Gabapentin Lopressor Nutrisource Nystatin Protonix KCL Scopolamine;amin patch Multivitamin   Patients Current Diet:     Diet Order                      DIET DYS 3 Room service appropriate? Yes; Fluid consistency: Thin  Diet effective now                      Precautions / Restrictions   Fall   Has the patient had 2 or more falls or a fall with injury in the past year? No   Prior Activity Level Community (5-7x/wk): driving, working. Patient cared for her adopted 62 year old grandson   Prior Functional Level Self Care: Did the patient need help bathing, dressing, using the toilet or eating? Independent   Indoor Mobility: Did the patient need assistance with walking from room to room (with or without device)? Independent   Stairs: Did the patient need assistance with internal or external stairs (with or without device)? Independent   Functional Cognition: Did the patient need help planning regular tasks such as shopping or remembering to take medications? [place] / Equipment  none   Prior Device Use: Indicate devices/aids used by the patient prior to current illness, exacerbation or injury? None of the above   Current Functional Level Cognition   [facility]    Extremity Assessment (includes Sensation/Coordination)   Pt. With decreased sensation, strength, and coordination with all extremities.       ADLs    Pt. Mod A with upper body ADLS, max A with lower body ADLs    Mobility    Pt. Mod A with bed mobility    Transfers    Max A     Ambulation / Gait / Stairs / Wheelchair Mobility   Not attempted    Posture / Balance Pt. Working toward endurance with standing. On 5/11, she stood and was able to bear weight,     Special needs/care consideration Skin Pt. with wounds to bilateral hands and feet with daily dressing changes and  compression stockings 24/7    Previous Home Environment   Lives With: [name] Available Help at Discharge: Family,Available 24 hours/day Type of Home: House Home Layout: One level Home Access: Level entry Bathroom Shower/Tub: Tub/shower Psychologist, occupational: Standard Bathroom Accessibility: Yes How Accessible: Accessible via walker [place]: No   Discharge Living Setting Plans for Discharge Living Setting: Patient's home Type of Home at Discharge: House Discharge Home Layout: One level Discharge Home Access: Level entry Discharge Bathroom Shower/Tub: Tub/shower unit,Walk-in shower Discharge Bathroom Toilet: Standard Discharge Bathroom Accessibility: Yes How Accessible: Accessible via walker Does the patient have any problems obtaining your medications?: No   Social/Family/Support Systems Anticipated Caregiver: [name] [name], [name] [name] Anticipated Caregiver's Contact Information: [name] Caregiver Availability: 24/7 Discharge Plan Discussed with Primary Caregiver: Yes Is Caregiver In Agreement with Plan?:  Yes Does Caregiver/Family have Issues with Lodging/Transportation while Pt is in Rehab?: No    Patient's daughter died 06-01-2020 from unintentional drug overdose. Other family members have died also in past 6 months. Patient is very emotional and would benefit from counseling   Goals Patient/Family Goal for Rehab: PT/OT Min A, SLP mod I  Expected length of stay: 2 to 3 weeks Pt/Family Agrees to Admission and willing to participate: Yes Program Orientation Provided & Reviewed with Pt/Caregiver Including Roles  & Responsibilities: Yes   Decrease burden of Care through IP rehab admission: NA   Possible need for SNF placement upon discharge: Not anticipated   Patient Condition: I have reviewed medical records from [facility] , spoken with CSW, and patient and [name]. I met with patient at the bedside for  inpatient rehabilitation assessment.  Patient will benefit from ongoing PT, OT and SLP, can actively participate in 3 hours of therapy a day 5 days of the week, and can make measurable gains during the admission.  Patient will also benefit from the coordinated team approach during an Inpatient Acute Rehabilitation admission.  The patient will receive intensive therapy as well as Rehabilitation physician, nursing, social worker, and care management interventions.  Due to bladder management, bowel management, safety, skin/wound care, disease management, medication administration, pain management and patient education the patient requires 24 hour a day rehabilitation nursing.  The patient is currently max assist overall with mobility and basic ADLs.  Discharge setting and therapy post discharge at home with home health is anticipated.  Patient has agreed to participate in the Acute Inpatient Rehabilitation Program and will admit today.   Preadmission Screen Completed By: Danne Baxter RN MSN 11/22/2020 at 1055 ______________________________________________________________________   Discussed status with Dr. Posey Pronto  on  11/22/2020 at  42 and received approval for admission today.   Admission Coordinator:  Danne Baxter RN MSN, time  1100 Date  11/22/2020    Assessment/Plan: 1. Diagnosis: Debility 2. Does the need for close, 24 hr/day Medical supervision in concert with the patient's rehab needs make it unreasonable for this patient to be served in a less intensive setting? Yes  3. Co-Morbidities requiring supervision/potential complications: psoriatic arthritis, anti-interleukin-17A antibody, HTN (monitor and provide prns in accordance with increased physical exertion and pain), CAD, and NSTEMI, DIC with complicating acute renal failure necessitating CRRT 4. Due to safety, skin/wound care, disease management, pain management and patient education, does the patient require 24 hr/day rehab nursing?  Yes 5. Does the patient require coordinated care of a physician, rehab nurse, PT, OT to address physical and functional deficits in the context of the above medical diagnosis(es)? Yes Addressing deficits in the following areas: balance, endurance, locomotion, strength, transferring, bathing, dressing, toileting and psychosocial support 6. Can the patient actively participate in an intensive therapy program of at least 3 hrs of therapy 5 days a week? Yes 7. The potential for patient to make measurable gains while on inpatient rehab is excellent 8. Anticipated functional outcomes upon discharge from inpatient rehab: min assist PT, min assist OT, n/a SLP 9. Estimated rehab length of stay to reach the above functional goals is: 19-23 days. 10. Anticipated discharge destination: Home 10. Overall Rehab/Functional Prognosis: good     MD Signature: Delice Lesch, MD, ABPMR          Revision History  Note Details  Author Posey Pronto, Domenick Bookbinder, MD File Time 11/22/2020 11:11 AM  Author Type Physician Status Signed  Last Editor Jamse Arn, MD Service Physical Medicine and [place] # [id number] Admit Date 11/22/2020

## 2020-11-22 NOTE — H&P (Signed)
Physical Medicine and Rehabilitation Admission H&P    CC: Weakness  HPI: [name] [name] is a 60 year old female with history of HTN, psoriatic arthritis (anti-interleukin 17 A antibody), major depression with anxiety d/o who was admitted to [facility] on 09/21/2020 with septic shock due to strep pyogenes. History taken from chart review and patient. Hospital course significant for VDRF requiring intubation, acute renal failure s/p CRRT, diarrhea, NSTEMI, aspiration PNA, DIC with ischemia of fingers/BLE, PE, thrombocytopenia and anemia. She was treated with multiple antibiotics as well as IVIG, extubated to [place] and remained NPO with tube feeds for nutritional support. . She continued to have leucocytosis with fever treated with Tygacil, flagyl and micafungin and was discharged to [facility] on 11/01/2020 for further management.  She continued to have issues with leucocytosis and fevers with delirium. Antibiotics changed to IV Vanc, Flagyl and Cefepime on 04/24-->narrowed to cefepime and Flagyl on 04/27 X 2 weeks.  Metoprolol titrated upwards and amiodarone weaned off per input from Dr. Algie Coffer. Klonopin added to help manage anxiety and diet slowly advanced to D3, thins--NO straws by 05/03.  ID/Dr. Macky Lower consulted for input due to SOB and fevers. CT of left foot and hand showed diffuse SQ edema without evidence of osteomyelitis. Dr. Lajoyce Corners consulted for input on 05/05 and recommended local care with Viva compression socks to be worn 24 hours and laundered daily. 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 CIR recommended due to functional decline. Please see preadmission assessment from earlier today as well.   Review of Systems  Constitutional: Negative for chills and fever.  HENT: Negative for hearing loss and tinnitus.   Eyes: Negative for blurred vision and double vision.  Respiratory: Negative for cough and shortness of  breath.   Cardiovascular: Negative for chest pain and palpitations.  Gastrointestinal: Positive for constipation. Negative for heartburn and nausea.  Genitourinary: Negative for dysuria and urgency.  Musculoskeletal: Positive for joint pain and myalgias. Negative for back pain and neck pain.  Skin: Negative for itching and rash.  Neurological: Positive for sensory change and weakness. Negative for dizziness and headaches.  Psychiatric/Behavioral: The patient is nervous/anxious and has insomnia.   All other systems reviewed and are negative.    Past Medical History:  Diagnosis Date  . Depression   . Generalized anxiety disorder   . NSTEMI (non-ST elevated myocardial infarction) [facility])      Past Surgical History:  Procedure Laterality Date  . CARDIAC CATHETERIZATION  09/2020     Family History  Problem Relation Age of Onset  . Lung cancer Mother   . Stroke Father   . Heart attack Maternal Grandmother    Social History:  Married. 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 She has an unknown smoking status. She has never used smokeless tobacco. She reports previous alcohol use. She reports that she does not use drugs.   Allergies: No Known Allergies   No medications prior to admission.    Drug Regimen Review  Drug regimen was reviewed and remains appropriate with no significant issues identified  Home: Home Living Available Help at Discharge: Family,Available 24 hours/day Type of Home: House Home Access: Level entry Home Layout: One level Bathroom Shower/Tub: Tub/shower Surveyor, minerals: Standard Bathroom Accessibility: Yes  Lives With: Spouse   Functional History: Independent without AD prior to admission.   Functional Status:  Mobility: Mod  assist bed mobility Mod assist for standing attempts.     ADL: Mod assist with UB care Max assist with LB care  Cognition:       There were no vitals  taken for this visit. Physical Exam Vitals and nursing note reviewed.  Constitutional:      General: She is not in acute distress.    Appearance: Normal appearance.     Comments: pleasant and appropriate but got teary at times.     HENT:     Head: Normocephalic and atraumatic.     Right Ear: External ear normal.     Left Ear: External ear normal.     Nose: Nose normal.  Eyes:     General:        Right eye: No discharge.        Left eye: No discharge.     Extraocular Movements: Extraocular movements intact.  Cardiovascular:     Rate and Rhythm: Normal rate and regular rhythm.  Pulmonary:     Effort: Pulmonary effort is normal. No respiratory distress.     Breath sounds: No stridor.  Abdominal:     General: Abdomen is flat. Bowel sounds are normal. There is no distension.  Musculoskeletal:        General: Swelling (2+ pedal edema) and tenderness (Bilateral LE) present.     Cervical back: Normal range of motion and neck supple.     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 Dorsal surface left hand with new skin. BLE with compressive dressing with dried blood. .   Skin:    Comments: Dry gangrene on B/l UE, R>L distal digits 1-4 LE with dressing CDI  Neurological:     Mental Status: She is alert and oriented to person, place, and time.     Comments: Alert Motor: B/l UE: Shoulder abduction, elbow flexion/extention 4/5, hand limited to due pain.  RLE: HF, KE 4/5, ADF 1/5 (some pain inhibition) LLE: HF, KE 4/5, ADF 3/5 (some pain inhibition)  Psychiatric:        Mood and Affect: Mood normal.        Behavior: Behavior normal.     Results for orders placed or performed during the hospital encounter of 11/01/20 (from the past 48 hour(s))  SARS CORONAVIRUS 2 (TAT 6-24 HRS) Nasopharyngeal Nasopharyngeal Swab     Status: None   Collection Time: 11/21/20  1:11 PM   Specimen: Nasopharyngeal Swab  Result Value Ref Range   SARS Coronavirus 2 NEGATIVE  NEGATIVE    Comment: (NOTE) SARS-CoV-2 target nucleic acids are NOT DETECTED.  The SARS-CoV-2 RNA is generally detectable in upper and lower respiratory specimens during the acute phase of infection. Negative results do not preclude SARS-CoV-2 infection, do not rule out co-infections with other pathogens, and should not be used as the sole basis for treatment or other patient management decisions. Negative results must be combined with clinical observations, patient history, and epidemiological information. The expected result is Negative.  Fact Sheet for Patients: HairSlick.no  Fact Sheet for Healthcare Providers: quierodirigir.com  This test is not yet approved or cleared by the [place] FDA and  has been authorized for detection and/or diagnosis of SARS-CoV-2 by FDA under an Emergency Use Authorization (EUA). This EUA will remain  in effect (meaning this test can be used) for the duration of the COVID-19 declaration under Se ction 564(b)(1) of the Act, 21 U.S.C. section 360bbb-3(b)(1), unless the authorization is terminated or revoked sooner.  Performed at [facility], [address], [place], [place] [address]    No results found.   Medical Problem List and Plan: 1.  Deficits with endurance, mobility, transfers, self-care secondary to debility.   -patient may not shower  -ELOS/Goals: 17-20 days/Min A  Admit to CIR 2.  PE/Antithrombotics: -DVT/anticoagulation:  Pharmaceutical: Other (comment)--Eliquis  -antiplatelet therapy: N/A 3. Pain Management: Oxycodone prn.   Monitor with increased exertion 4. Mood: LCSW to follow for evaluation and support. Team to offer ego support.   -antipsychotic agents: N/A 5. Neuropsych: This patient is capable of making decisions on her own behalf. 6. Skin/Wound Care:   Prevalon boots for use when in bed. Continue left hand splint.   --Darco shoe for weight bearing per  PT input.  7. Fluids/Electrolytes/Nutrition: Monitor  I/Os  CMP ordered  Added protein  supplements to help promote wound healing.  8.  PAF: Monitor HR tid--continue Metoprolol and Cardizem for rate control.   Monitor with increase activity.  9. Dysphagia: Continue aspiration precautions--No straws.    Advance diet as tolerated 10. ABLA  CBC ordered  Jacquelynn Cree, PA-C 11/22/2020  I have personally performed a face to face diagnostic evaluation, including, but not limited to relevant history and physical exam findings, of this patient and developed relevant assessment and plan.  Additionally, I have reviewed and concur with the physician assistant's documentation above.  Maryla Morrow, MD, ABPMR  The patient's status has not changed. Any changes from the pre-admission screening or documentation from the acute chart are noted above.   Maryla Morrow, MD, ABPMR

## 2020-11-22 NOTE — Progress Notes (Signed)
Pt arrived to unit via bed by nursing staff, pt alert and oriented x4, in no distress, breathing even and unlabored, no issues noted at this time.

## 2020-11-23 DIAGNOSIS — R5381 Other malaise: Principal | ICD-10-CM

## 2020-11-23 MED ORDER — GABAPENTIN 300 MG PO CAPS
300.0000 mg | ORAL_CAPSULE | Freq: Two times a day (BID) | ORAL | Status: DC
  Administered 2020-11-23 – 2020-11-24 (×3): 300 mg via ORAL
  Filled 2020-11-23 (×3): qty 1

## 2020-11-23 MED ORDER — PANTOPRAZOLE SODIUM 40 MG PO TBEC
40.0000 mg | DELAYED_RELEASE_TABLET | Freq: Every day | ORAL | Status: DC
  Administered 2020-11-23 – 2020-12-23 (×31): 40 mg via ORAL
  Filled 2020-11-23 (×32): qty 1

## 2020-11-23 MED ORDER — OXYCODONE HCL 5 MG PO TABS
5.0000 mg | ORAL_TABLET | Freq: Four times a day (QID) | ORAL | Status: DC | PRN
Start: 2020-11-23 — End: 2020-11-25
  Administered 2020-11-23 – 2020-11-25 (×6): 5 mg via ORAL
  Filled 2020-11-23 (×6): qty 1

## 2020-11-23 NOTE — Plan of Care (Signed)
  Problem: RH Balance Goal: LTG Patient will maintain dynamic sitting balance (PT) Description: LTG:  Patient will maintain dynamic sitting balance with assistance during mobility activities (PT) Flowsheets (Taken 11/23/2020 1656) LTG: Pt will maintain dynamic sitting balance during mobility activities with:: Independent with assistive device    Problem: Sit to Stand Goal: LTG:  Patient will perform sit to stand with assistance level (PT) Description: LTG:  Patient will perform sit to stand with assistance level (PT) Flowsheets (Taken 11/23/2020 1656) LTG: PT will perform sit to stand in preparation for functional mobility with assistance level: Moderate Assistance - Patient 50 - 74%   Problem: RH Bed Mobility Goal: LTG Patient will perform bed mobility with assist (PT) Description: LTG: Patient will perform bed mobility with assistance, with/without cues (PT). Flowsheets (Taken 11/23/2020 1656) LTG: Pt will perform bed mobility with assistance level of: Contact Guard/Touching assist   Problem: RH Bed to Chair Transfers Goal: LTG Patient will perform bed/chair transfers w/assist (PT) Description: LTG: Patient will perform bed to chair transfers with assistance (PT). Flowsheets (Taken 11/23/2020 1656) LTG: Pt will perform Bed to Chair Transfers with assistance level: (with SB) Minimal Assistance - Patient > 75%   Problem: RH Car Transfers Goal: LTG Patient will perform car transfers with assist (PT) Description: LTG: Patient will perform car transfers with assistance (PT). Flowsheets (Taken 11/23/2020 1656) LTG: Pt will perform car transfers with assist:: (with SB) Moderate Assistance - Patient 50 - 74%   Problem: RH Ambulation Goal: LTG Patient will ambulate in controlled environment (PT) Description: LTG: Patient will ambulate in a controlled environment, # of feet with assistance (PT). Flowsheets (Taken 11/23/2020 1656) LTG: Pt will ambulate in controlled environ  assist needed:: (TBD)  --   Problem: RH Wheelchair Mobility Goal: LTG Patient will propel w/c in controlled environment (PT) Description: LTG: Patient will propel wheelchair in controlled environment, # of feet with assist (PT) Flowsheets (Taken 11/23/2020 1656) LTG: Pt will propel w/c in controlled environ  assist needed:: Supervision/Verbal cueing LTG: Propel w/c distance in controlled environment: 183ft in power vs manual WC Goal: LTG Patient will propel w/c in home environment (PT) Description: LTG: Patient will propel wheelchair in home environment, # of feet with assistance (PT). Flowsheets (Taken 11/23/2020 1656) LTG: Propel w/c distance in home environment: 8ft in power vs manual WC

## 2020-11-23 NOTE — Progress Notes (Signed)
Inpatient Rehabilitation  Patient information reviewed and entered into eRehab system by Taylyn Brame M. Alexis Reber, M.A., CCC/SLP, PPS Coordinator.  Information including medical coding, functional ability and quality indicators will be reviewed and updated through discharge.    

## 2020-11-23 NOTE — Progress Notes (Signed)
PROGRESS NOTE   Subjective/Complaints:  Has tingling / pain Bilateral feet, was relieved by gabapentin which pt reports taking during LTACH stay   ROS- No CP, SOB, N/V/D Objective:   No results found. No results for input(s): WBC, HGB, HCT, PLT in the last 72 hours. No results for input(s): NA, K, CL, CO2, GLUCOSE, BUN, CREATININE, CALCIUM in the last 72 hours.  Intake/Output Summary (Last 24 hours) at 11/23/2020 0931 Last data filed at 11/23/2020 0757 Gross per 24 hour  Intake 560 ml  Output --  Net 560 ml        Physical Exam: Vital Signs Blood pressure 121/60, pulse 96, temperature 98.6 F (37 C), resp. rate 20, height 5\' 4"  (1.626 m), weight 85.9 kg, last menstrual period 11/22/2020, SpO2 97 %.   General: No acute distress Mood and affect are appropriate Heart: Regular rate and rhythm no rubs murmurs or extra sounds Lungs: Clear to auscultation, breathing unlabored, no rales or wheezes Abdomen: Positive bowel sounds, soft nontender to palpation, nondistended Extremities: No clubbing, cyanosis, or edema Skin: gangrenous areas finger tips BUE and toes, bilateral dorsum of foot with superficial sloughing of ruptured bulla , no foul odor, eschar RIght heel Neurologic: Cranial nerves II through XII intact, motor strength is 5/5 in bilateral deltoid, bicep, tricep, grip, hip flexor, knee extensors, ankle dorsiflexor and plantar flexor Sensory exam normal sensation to light touch and proprioception in bilateral upper and lower extremities Cerebellar exam normal finger to nose to finger as well as heel to shin in bilateral upper and lower extremities Musculoskeletal: Full range of motion in all 4 extremities. No joint swelling  Assessment/Plan: 1. Functional deficits which require 3+ hours per day of interdisciplinary therapy in a comprehensive inpatient rehab setting.  Physiatrist is providing close team supervision and  24 hour management of active medical problems listed below.  Physiatrist and rehab team continue to assess barriers to discharge/monitor patient progress toward functional and medical goals  Care Tool:  Bathing              Bathing assist       Upper Body Dressing/Undressing Upper body dressing   What is the patient wearing?: Hospital gown only    Upper body assist Assist Level: Moderate Assistance - Patient 50 - 74%    Lower Body Dressing/Undressing Lower body dressing      What is the patient wearing?: Hospital gown only     Lower body assist Assist for lower body dressing: Maximal Assistance - Patient 25 - 49%     Toileting Toileting    Toileting assist Assist for toileting: Moderate Assistance - Patient 50 - 74%     Transfers Chair/bed transfer  Transfers assist           Locomotion Ambulation   Ambulation assist              Walk 10 feet activity   Assist           Walk 50 feet activity   Assist           Walk 150 feet activity   Assist  Walk 10 feet on uneven surface  activity   Assist           Wheelchair     Assist               Wheelchair 50 feet with 2 turns activity    Assist            Wheelchair 150 feet activity     Assist          Blood pressure 121/60, pulse 96, temperature 98.6 F (37 C), resp. rate 20, height 5\' 4"  (1.626 m), weight 85.9 kg, last menstrual period 11/22/2020, SpO2 97 %.   Medical Problem List and Plan: 1.  Deficits with endurance, mobility, transfers, self-care secondary to debility.              -patient may not shower             -ELOS/Goals: 17-20 days/Min A             Admit to CIR 2.  PE/Antithrombotics: -DVT/anticoagulation:  Pharmaceutical: Other (comment)--Eliquis             -antiplatelet therapy: N/A 3. Pain Management: Oxycodone 5mg  q 6 h prn, also  Gabapentin 300mg  BID              Monitor with increased exertion 4.  Mood: LCSW to follow for evaluation and support. Team to offer ego support.              -antipsychotic agents: N/A 5. Neuropsych: This patient is capable of making decisions on her own behalf. 6. Skin/Wound Care:              Prevalon boots for use when in bed. Continue left hand splint.              --Darco shoe for weight bearing per PT input. No sig changes vs pictures taken 5/6 in ortho consult  7. Fluids/Electrolytes/Nutrition: Monitor  I/Os             CMP ordered             Added protein  supplements to help promote wound healing.  8.  PAF: Monitor HR tid--continue Metoprolol and Cardizem for rate control.             Vitals:   11/23/20 0005 11/23/20 0546  BP: 120/66 121/60  Pulse: 94 96  Resp:  20  Temp:  98.6 F (37 C)  SpO2:  97%   9. Dysphagia: Continue aspiration precautions--No straws.               Advance diet as tolerated 10. ABLA             CBC ordered    LOS: 1 days A FACE TO FACE EVALUATION WAS PERFORMED  11/23/2020, 9:31 AM

## 2020-11-23 NOTE — Plan of Care (Signed)
  Problem: RH Grooming Goal: LTG Patient will perform grooming w/assist,cues/equip (OT) Description: LTG: Patient will perform grooming with assist, with/without cues using equipment (OT) Flowsheets (Taken 11/23/2020 1525) LTG: Pt will perform grooming with assistance level of: Set up assist    Problem: RH Bathing Goal: LTG Patient will bathe all body parts with assist levels (OT) Description: LTG: Patient will bathe all body parts with assist levels (OT) Flowsheets (Taken 11/23/2020 1525) LTG: Pt will perform bathing with assistance level/cueing: Minimal Assistance - Patient > 75%   Problem: RH Dressing Goal: LTG Patient will perform upper body dressing (OT) Description: LTG Patient will perform upper body dressing with assist, with/without cues (OT). Flowsheets (Taken 11/23/2020 1525) LTG: Pt will perform upper body dressing with assistance level of: Minimal Assistance - Patient > 75% Goal: LTG Patient will perform lower body dressing w/assist (OT) Description: LTG: Patient will perform lower body dressing with assist, with/without cues in positioning using equipment (OT) Flowsheets (Taken 11/23/2020 1525) LTG: Pt will perform lower body dressing with assistance level of: Minimal Assistance - Patient > 75%   Problem: RH Toileting Goal: LTG Patient will perform toileting task (3/3 steps) with assistance level (OT) Description: LTG: Patient will perform toileting task (3/3 steps) with assistance level (OT)  Flowsheets (Taken 11/23/2020 1525) LTG: Pt will perform toileting task (3/3 steps) with assistance level: Minimal Assistance - Patient > 75%   Problem: RH Toilet Transfers Goal: LTG Patient will perform toilet transfers w/assist (OT) Description: LTG: Patient will perform toilet transfers with assist, with/without cues using equipment (OT) Flowsheets (Taken 11/23/2020 1525) LTG: Pt will perform toilet transfers with assistance level of: Minimal Assistance - Patient > 75%

## 2020-11-23 NOTE — Plan of Care (Signed)
  Problem: RH SKIN INTEGRITY Goal: RH STG SKIN FREE OF INFECTION/BREAKDOWN Description: Patient will be free from new breakdown and healing of skin with min assist at discharge and be able to direct dressing changes independently using handouts and educational tools Outcome: Progressing bleeding noted after black socks was taken off by RN and OT. Wound cleansed with saline and MD was notified. New orders noted.

## 2020-11-23 NOTE — Evaluation (Signed)
Physical Therapy Assessment and Plan  Patient Details  Name: [name] [name] MRN: [id number] Date of Birth: [birth year]  PT Diagnosis: Abnormal posture, Abnormality of gait, Coordination disorder, Difficulty walking, Impaired cognition, Impaired sensation, Muscle weakness and Pain in joint Rehab Potential: Fair ELOS: 28-32 days   Today's Date: 11/23/2020 PT Individual Time: 1300-1415 and 1500-1545 PT Individual Time Calculation (min): 75 min and 45 min     Hospital Problem: Principal Problem:   Debility   Past Medical History:  Past Medical History:  Diagnosis Date  . Depression   . Generalized anxiety disorder   . NSTEMI (non-ST elevated myocardial infarction) [facility])    Past Surgical History:  Past Surgical History:  Procedure Laterality Date  . CARDIAC CATHETERIZATION  09/2020    Assessment & Plan Clinical Impression: Patient is a 60 year old female with history of HTN, psoriatic arthritis (anti-interleukin 17 A antibody), major depression with anxiety d/o who was admitted to [facility] on 09/21/2020 with septic shock due to strep pyogenes. History taken from chart review and patient. Hospital course significant for VDRF requiring intubation, acute renal failure s/p CRRT, diarrhea, NSTEMI, aspiration PNA, DIC with ischemia of fingers/BLE, PE, thrombocytopenia and anemia. She was treated with multiple antibiotics as well as IVIG, extubated to [place] and remained NPO with tube feeds for nutritional support. . She continued to have leucocytosis with fever treated with Tygacil, flagyl and micafungin and was discharged to [facility] on 11/01/2020 for further management.  She continued to have issues with leucocytosis and fevers with delirium. Antibiotics changed to IV Vanc, Flagyl and Cefepime on 04/24-->narrowed to cefepime and Flagyl on 04/27 X 2 weeks.  Metoprolol titrated upwards and amiodarone weaned off per input from Dr. Doylene Canard. Klonopin added to help manage anxiety and diet  slowly advanced to D3, thins--NO straws by 05/03.  ID/Dr. Barth Kirks consulted for input due to SOB and fevers. CT of left foot and hand showed diffuse SQ edema without evidence of osteomyelitis. Dr. Sharol Given consulted for input on 05/05 and recommended local care with Viva compression socks to be worn 24 hours and laundered daily. 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  Patient transferred to CIR on 11/22/2020 .   Patient currently requires total with mobility secondary to muscle weakness, muscle joint tightness and muscle paralysis, unbalanced muscle activation and decreased sitting balance, decreased standing balance, decreased balance strategies and difficulty maintaining precautions.  Prior to hospitalization, patient was independent  with mobility and lived with Spouse,Other (Comment) in a House home.  Home access is 1Stairs to enter.  Patient will benefit from skilled PT intervention to maximize safe functional mobility, minimize fall risk and decrease caregiver burden for planned discharge home with 24 hour assist.  Anticipate patient will benefit from follow up [facility] at discharge.  PT - End of Session Activity Tolerance: Tolerates < 10 min activity, no significant change in vital signs Endurance Deficit: Yes PT Assessment Rehab Potential (ACUTE/IP ONLY): Fair PT Barriers to Discharge: [place] home environment;Decreased caregiver support;Home environment access/layout;Insurance for SNF coverage;Weight bearing restrictions;Pending surgery;Pending chemo/radiation;Wound Care;Incontinence PT Patient demonstrates impairments in the following area(s): Balance;Behavior;Motor;Nutrition;Edema;Perception;Safety;Sensory;Endurance;Skin Integrity;Pain PT Transfers Functional Problem(s): Bed to Chair;Car;Furniture;Floor;Bed Mobility PT Locomotion Functional Problem(s): Ambulation;Wheelchair Mobility;Stairs PT Plan PT Intensity: Minimum of 1-2 x/day  ,45 to 90 minutes PT Frequency: 5 out of 7 days PT Duration Estimated Length of Stay: 28-32 days PT Treatment/Interventions: Ambulation/gait training;DME/adaptive equipment instruction;Community reintegration;Neuromuscular re-education;Stair training;Wheelchair propulsion/positioning;UE/LE Strength taining/ROM;Psychosocial support;Discharge  planning;Balance/vestibular training;Functional electrical stimulation;Pain management;Skin care/wound management;Cognitive remediation/compensation;Disease management/prevention;Functional mobility training;Patient/family education;Splinting/orthotics;Therapeutic Exercise;Visual/perceptual remediation/compensation;UE/LE Coordination activities;Therapeutic Activities PT Transfers Anticipated Outcome(s): Min assist transfers to and from [facility] with min assist PT Locomotion Anticipated Outcome(s): Supervision assist to modificed indep with Power WC . PT Recommendation Recommendations for Other Services: Therapeutic Recreation consult;Neuropsych consult Therapeutic Recreation Interventions: Stress management;Outing/community reintergration Follow Up Recommendations: Home health PT Patient destination: Home Equipment Recommended: Wheelchair cushion (measurements);Wheelchair (measurements);To be determined Equipment Details: power WC, SB.   PT Evaluation Precautions/Restrictions Precautions Precautions: Fall Precaution Comments: Gangrenous changes in hands Rt>Lt, open foot wounds bilaterally Restrictions RLE Weight Bearing: Weight bearing as tolerated LLE Weight Bearing: Weight bearing as tolerated Other Position/Activity Restrictions: WBAT B LEs while wearing compression socks + Darco shoes General   Vital Signs  Pain Pain Assessment Pain Scale: 0-10 Pain Score: 10-Worst pain ever Pain Type: Chronic pain Pain Location: Foot Pain Orientation: Right;Left Pain Descriptors / Indicators: Burning;Numbness Pain Onset: On-going Patients Stated Pain Goal:  3 Pain Intervention(s): Medication (See eMAR) Multiple Pain Sites: Yes 2nd Pain Site Pain Score: 5 Pain Type: Chronic pain Pain Location: Hand Pain Orientation: Right;Left Pain Descriptors / Indicators: Burning;Numbness Patient's Stated Pain Goal: 3 Home Living/Prior Functioning Home Living Available Help at Discharge: Family;Available 24 hours/day Type of Home: House Home Access: Stairs to enter [employer] of Steps: 1 Entrance Stairs-Rails: None (threshold into house) Home Layout: One level Bathroom Shower/Tub: Tub/shower unit;Walk-in shower Bathroom Toilet: Standard Bathroom Accessibility: Yes  Lives With: Spouse;Other (Comment) Prior Function Level of Independence: Independent with basic ADLs;Independent with homemaking with ambulation Driving: Yes Comments: caring after 3 YO grandson Vision/Perception  Perception Perception: Within Functional Limits Praxis Praxis: Intact  Cognition Overall Cognitive Status: Impaired/Different from baseline Arousal/Alertness: Awake/alert Orientation Level: Oriented X4 Attention: Alternating Alternating Attention: Impaired Alternating Attention Impairment: Verbal complex;Functional complex Memory: Impaired Memory Impairment: Decreased recall of new information;Retrieval deficit Memory Recall Bed: Without Cue Awareness: Appears intact Problem Solving: Impaired Problem Solving Impairment: Verbal complex;Functional complex Safety/Judgment: Appears intact Sensation Sensation Light Touch: Impaired Detail (Absent sensation gangrenous areas of upper/lower limbs) Light Touch Impaired Details: Impaired RUE;Impaired LUE;Impaired RLE;Impaired LLE Additional Comments: 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 parasthesia reported with testing. Coordination Gross Motor Movements are Fluid and Coordinated: Not tested (not thoroughly tested as tx was only bedlevel) Fine Motor Movements are Fluid and Coordinated: No (affected by  gangrenous changes in fingers) Finger Nose Finger Test: WNL bilaterally Heel Shin Test: limited due to strength deficits Motor  Motor Motor: Other (comment) Motor - Skilled Clinical Observations: generalized weakness and endurance deficits, pain limiting and Critical illness myopathy/neuropathy   Trunk/Postural Assessment  Cervical Assessment Cervical Assessment: Within Functional Limits (unable to assess) Thoracic Assessment Thoracic Assessment: Exceptions to [facility] (unable to assess) Lumbar Assessment Lumbar Assessment: Exceptions to [facility] (unable to assess) Postural Control Postural Control: Deficits on evaluation (unable to assess)  Balance Balance Balance Assessed: Yes Static Sitting Balance Static Sitting - Level of Assistance: 5: Stand by assistance Static Sitting - Comment/# of Minutes: back supported Dynamic Sitting Balance Dynamic Sitting - Level of Assistance: 4: Min assist Dynamic Sitting Balance - Compensations: back supported in [facility] Extremity Assessment      RLE Assessment RLE Assessment: Exceptions to [facility] General Strength Comments: ankle RLE Strength Right Hip Flexion: 2+/5 Right Hip Extension: 2/5 Right Hip ABduction: 2+/5 Right Hip ADduction: 3-/5 Right Knee Flexion: 2+/5 Right Knee Extension: 2+/5 Right Ankle Dorsiflexion: 0/5 Right Ankle Plantar Flexion: 0/5 Right Ankle Inversion: 0/5 Right Ankle Eversion: 0/5  LLE Assessment LLE Assessment: Exceptions to [facility] LLE Strength Left Hip Flexion: 3-/5 Left Hip Extension: 2+/5 Left Hip ABduction: 3-/5 Left Hip ADduction: 3-/5 Left Knee Flexion: 3-/5 Left Knee Extension: 3/5 Left Ankle Dorsiflexion: 2-/5 Left Ankle Plantar Flexion: 2-/5 Left Ankle Inversion: 0/5 Left Ankle Eversion: 0/5  Care Tool Care Tool Bed Mobility Roll left and right activity   Roll left and right assist level: Moderate Assistance - Patient 50 - 74%    Sit to lying activity   Sit to lying assist level: Maximal Assistance -  Patient 25 - 49%    Lying to sitting edge of bed activity   Lying to sitting edge of bed assist level: Maximal Assistance - Patient 25 - 49%     Care Tool Transfers Sit to stand transfer Sit to stand activity did not occur: Safety/medical concerns      Chair/bed transfer   Chair/bed transfer assist level: Dependent - mechanical lift     Toilet transfer Toilet transfer activity did not occur: Safety/medical concerns      Scientist, product/process development transfer activity did not occur: Safety/medical concerns        Care Tool Locomotion Ambulation Ambulation activity did not occur: Safety/medical concerns        Walk 10 feet activity Walk 10 feet activity did not occur: Safety/medical concerns       Walk 50 feet with 2 turns activity Walk 50 feet with 2 turns activity did not occur: Safety/medical concerns      Walk 150 feet activity Walk 150 feet activity did not occur: Safety/medical concerns      Walk 10 feet on uneven surfaces activity Walk 10 feet on uneven surfaces activity did not occur: Safety/medical concerns      Stairs Stair activity did not occur: Safety/medical concerns        Walk up/down 1 step activity Walk up/down 1 step or curb (drop down) activity did not occur: Safety/medical concerns     Walk up/down 4 steps activity did not occuR: Safety/medical concerns  Walk up/down 4 steps activity      Walk up/down 12 steps activity Walk up/down 12 steps activity did not occur: Safety/medical concerns      Pick up small objects from floor Pick up small object from the floor (from standing position) activity did not occur: Safety/medical concerns      Wheelchair Will patient use wheelchair at discharge?: Yes Type of Wheelchair: Power   Wheelchair assist level: Moderate Assistance - Patient 50 - 74% Max wheelchair distance: 150  Wheel 50 feet with 2 turns activity   Assist Level: Moderate Assistance - Patient 50 - 74%  Wheel 150 feet activity   Assist Level:  Moderate Assistance - Patient 50 - 74%    Refer to Care Plan for Long Term Goals  SHORT TERM GOAL WEEK 1 PT Short Term Goal 1 (Week 1): Pt will perform bed mobility with mod assist PT Short Term Goal 2 (Week 1): Pt will perform SB transfer with max A +2 PT Short Term Goal 3 (Week 1): Pt will tolerate sitting in WC >2 hours between therapies PT Short Term Goal 4 (Week 1): Pt will perform sit>stand with +2 assist  Recommendations for other services: Neuropsych and Therapeutic Recreation  Stress management and Outing/community reintegration  Skilled Therapeutic Intervention  Pt received supine in bed and agreeable to PT. PT instructed patient in PT Evaluation and initiated treatment intervention; see above for results. PT educated patient in [place], rehab  potential, rehab goals, and discharge recommendations along with recommendation for follow-up rehabilitation services. PT obtained power WC, gel cushion and maximove. Rolling R and L with mod assist to place brief and maximove sling. Maxi move transfer to [facility] with dependent assist. Pt left sitting in [facility] with all needs met.   Session 2.  Pt received sitting in WC and agreeable to PT. Pt performed WC MObility through hall with mod assist as listed below for control of joystick to avoid obstacles x 174f. Pt returned to room and performed maxi move transfer to bed with dependent A.  Rolling R and L to remove sling with mod assist. Pt  left supine in bed with call bell in reach and all needs met.      Mobility Bed Mobility Bed Mobility: Rolling Left;Supine to Sit;Rolling Right;Sit to Supine Rolling Right: Moderate Assistance - Patient 50-74% Rolling Left: Moderate Assistance - Patient 50-74% Supine to Sit: Maximal Assistance - Patient - Patient 25-49%;Total Assistance - Patient < 25% Sit to Supine: Maximal Assistance - Patient 25-49%;Total Assistance - Patient < 25% Transfers Transfers: Transfer via LStage manager  MOccupational psychologistAmbulation: No Gait Gait: No Stairs / Additional Locomotion Stairs: No WArchitect Yes Wheelchair Assistance: Moderate Assistance - Patient 50 - 74% (for UE control with power steering) Wheelchair Propulsion: Left upper extremity Distance: 1569fpower WC   Discharge Criteria: Patient will be discharged from PT if patient refuses treatment 3 consecutive times without medical reason, if treatment goals not met, if there is a change in medical status, if patient makes no progress towards goals or if patient is discharged from hospital.  The above assessment, treatment plan, treatment alternatives and goals were discussed and mutually agreed upon: by patient  AuLorie Phenix/14/2022, 5:08 PM

## 2020-11-23 NOTE — Evaluation (Signed)
Speech Language Pathology Assessment and Plan  Patient Details  Name: [name] [name] MRN: [id number] Date of Birth: 02/11/1961  SLP Diagnosis: Speech and Language deficits;Cognitive Impairments  Rehab Potential: Good ELOS: 3-4 weeks    Today's Date: 11/23/2020 SLP Individual Time: 3875-6433 SLP Individual Time Calculation (min): 55 min   Hospital Problem: Principal Problem:   Debility  Past Medical History:  Past Medical History:  Diagnosis Date  . Depression   . Generalized anxiety disorder   . NSTEMI (non-ST elevated myocardial infarction) [facility])    Past Surgical History:  Past Surgical History:  Procedure Laterality Date  . CARDIAC CATHETERIZATION  09/2020    Assessment / Plan / Recommendation Clinical Impression 60 year old female with history of psoriatic arthritis, anti-interleukin-17A antibody, hypertension, CAD, and NSTEMI who was admitted to acute hospital 10/08/2020 with streptococcus pyogenes sepsis, DIC with complicating acute renal failure necessitating CRRT. The patient received HD during that admission and underwent thoracentesis.No longer on CRRT or Hemodialysis.Infectious disease placed her onClinda, Levaquin, and Flagyl.Completed a course of IVIG. She developed respiratory failure and was subsequently intubated, then extubated to 2 L via nasal cannula. CXR revealed RLL PNA, which was treated. While on pressors in the ICU, patient developed digital ischemia secondary to sepsis/disseminated intravascular coagulation.  [facility] withDigital/limb ischemiawith consultation ofDr. Sharol Given with orthopedics. Compression wear for allbilateral LEs. On [facility] her antibiotics were changed to Vancomycin and Zosyn with improvement in leukocytosis and fever. ID consultedl and she was switched to Vanc, cefepime Flagyl per their recommendations. Patient weaned to room air, receiving wound care at [facility], and working with  therapies to regain strength and independence.[facility] and physician.  Pt presents with mild cognitive linguistic impairments, deficits include executive function, short term recall, alternating attention and higher level word finding in conversation.  Formal cognitive linguistic assessment utilizing  CLQT, indicated mild deficits in attention (176, n=> 180) and memory (151 , n=>155). Pt also required repeat directions in executive function task due to deficits in recall. Pt supports changes in cognitive skills, most notable to pt is difficulty with word finding. Pt only demonstrated word finding deficits in conversation x1, however in structured generalization task demonstrated increase difficulty, likely impacted by deficits inattention/memory verse language impairment. Pt supports no acute changes in swallowing and speech, as well as diet recently upgraded to regular on [facility] prior to CIR admission. SLP upgraded order to regular textures. Pt would benefit from skilled ST services in order to maximize functional independence in cognitive-linguistic skills and reduce burden of care, not likely requiring continued ST services at discharge.    Skilled Therapeutic Interventions          Skilled ST services focused on cognitive skills. SLP facilitated administration of cognitive linguistic formal assessment and provided education of results. SLP and pt collaborated to set goals for cognitive linguistic needs during length of stay. All questions answered to satisfaction.  Pt was left in room with husband, call bell within reach and bed alarm set. SLP recommends to continue skilled services.   SLP Assessment  Patient will need skilled [place] Pathology Services during CIR admission    Recommendations  Recommendations for Other Services: Neuropsych consult Patient destination: Home Follow up  Recommendations: None (TBD) Equipment Recommended: None recommended by SLP    SLP Frequency 1 to 3 out of 7 days   SLP Duration  SLP Intensity  SLP Treatment/Interventions 3-4 weeks  Minumum of 1-2 x/day, 30 to 90 minutes  Cognitive remediation/compensation;Cueing hierarchy;Internal/external aids;Functional tasks;Medication managment;Patient/family education;Speech/Language facilitation    Pain Pain Assessment Pain Scale: 0-10 Pain Score: 10-Worst pain ever Pain Type: Chronic pain Pain Location: Foot Pain Orientation: Right;Left Pain Descriptors / Indicators: Burning;Numbness Pain Onset: On-going Patients Stated Pain Goal: 3 Pain Intervention(s): Medication (See eMAR) Multiple Pain Sites: Yes 2nd Pain Site Pain Score: 5 Pain Type: Chronic pain Pain Location: Hand Pain Orientation: Right;Left Pain Descriptors / Indicators: Burning;Numbness Patient's Stated Pain Goal: 3  Prior Functioning Cognitive/Linguistic Baseline: Within functional limits Type of Home: House  Lives With: Spouse;Other (Comment) Available Help at Discharge: Family;Available 24 hours/day  SLP Evaluation Cognition Overall Cognitive Status: Impaired/Different from baseline Arousal/Alertness: Awake/alert Orientation Level: Oriented X4 Attention: Alternating Alternating Attention: Impaired Alternating Attention Impairment: Verbal complex;Functional complex Memory: Impaired Memory Impairment: Decreased recall of new information;Retrieval deficit Memory Recall Bed: Without Cue Awareness: Appears intact Problem Solving: Impaired Problem Solving Impairment: Verbal complex;Functional complex Safety/Judgment: Appears intact  Comprehension Auditory Comprehension Overall Auditory Comprehension: Appears within functional limits for tasks assessed Yes/No Questions: Within Functional Limits Commands: Impaired Two Step Basic Commands: 75-100% accurate (impacted by recall deficits) Multistep Basic  Commands: 75-100% accurate (impacted by recall deficits) Conversation: Complex Expression Expression Primary Mode of Expression: Verbal Verbal Expression Overall Verbal Expression: Impaired Initiation: No impairment Level of Generative/Spontaneous Verbalization: Conversation Repetition: No impairment Naming: Impairment Confrontation: Within functional limits Divergent: 50-74% accurate (likely impacted by recall and attention) Oral Motor Oral Motor/Sensory Function Overall Oral Motor/Sensory Function: Within functional limits Motor Speech Overall Motor Speech: Appears within functional limits for tasks assessed  Care Tool Care Tool Cognition Expression of Ideas and Wants Expression of Ideas and Wants: Some difficulty - exhibits some difficulty with expressing needs and ideas (e.g, some words or finishing thoughts) or speech is not clear   Understanding Verbal and Non-Verbal Content Understanding Verbal and Non-Verbal Content: Usually understands - understands most conversations, but misses some part/intent of message. Requires cues at times to understand   Memory/Recall Ability *first 3 days only       Short Term Goals: Week 1: SLP Short Term Goal 1 (Week 1): Pt will demonstrate complex problem solving in functional task (ex: medication/money/time management) with supervison A verbal cues. SLP Short Term Goal 2 (Week 1): Pt will demonstrate alternating attention during complex tasks in 10 minute intervals with supervision A verbal cues. SLP Short Term Goal 3 (Week 1): Pt will demonstrate recall of novel, daily information with supervision A verbal cues. SLP Short Term Goal 4 (Week 1): Pt will participate in higher level word finding task (ex: divergent and convergent naming) with supervision A semantic cues.  Refer to Care Plan for Long Term Goals  Recommendations for other services: Neuropsych  Discharge Criteria: Patient will be discharged from SLP if patient refuses treatment  3 consecutive times without medical reason, if treatment goals not met, if there is a change in medical status, if patient makes no progress towards goals or if patient is discharged from hospital.  The above assessment, treatment plan, treatment alternatives and goals were discussed and mutually agreed upon: by patient and by family  Muhannad Bignell  [facility] [place] 11/23/2020, 5:06 PM

## 2020-11-23 NOTE — Evaluation (Signed)
Occupational Therapy Assessment and Plan  Patient Details  Name: [name] [name] MRN: [id number] Date of Birth: 1960/09/08  OT Diagnosis: acute pain, muscle weakness (generalized) and impaired sensation Rehab Potential: Rehab Potential (ACUTE ONLY): Fair ELOS: 3-4 weeks   Today's Date: 11/23/2020 OT Individual Time: 0845-1000 OT Individual Time Calculation (min): 75 min     Hospital Problem: Principal Problem:   Debility   Past Medical History:  Past Medical History:  Diagnosis Date  . Depression   . Generalized anxiety disorder   . NSTEMI (non-ST elevated myocardial infarction) [facility])    Past Surgical History:  Past Surgical History:  Procedure Laterality Date  . CARDIAC CATHETERIZATION  09/2020    Assessment & Plan Clinical Impression: [name] [name] is a 60 year old female with history of HTN, psoriatic arthritis (anti-interleukin 17 A antibody), major depression with anxiety d/o who was admitted to [facility] on 09/21/2020 with septic shock due to strep pyogenes. History taken from chart review and patient. Hospital course significant for VDRF requiring intubation, acute renal failure s/p CRRT, diarrhea, NSTEMI, aspiration PNA, DIC with ischemia of fingers/BLE, PE, thrombocytopenia and anemia. She was treated with multiple antibiotics as well as IVIG, extubated to [facility] and remained NPO with tube feeds for nutritional support. . She continued to have leucocytosis with fever treated with Tygacil, flagyl and micafungin and was discharged to [facility] on 11/01/2020 for further management.  She continued to have issues with leucocytosis and fevers with delirium. Antibiotics changed to IV Vanc, Flagyl and Cefepime on 04/24-->narrowed to cefepime and Flagyl on 04/27 X 2 weeks.  Metoprolol titrated upwards and amiodarone weaned off per input from Dr. Doylene Canard. Klonopin added to help manage anxiety and diet slowly advanced to D3, thins--NO straws by 05/03.  ID/Dr. Barth Kirks consulted  for input due to SOB and fevers. CT of left foot and hand showed diffuse SQ edema without evidence of osteomyelitis. Dr. Sharol Given consulted for input on 05/05 and recommended local care with Viva compression socks to be worn 24 hours and laundered daily. 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 CIR recommended due to functional decline. Please see preadmission assessment from earlier today as well.  Patient currently requires total with basic self-care skills secondary to muscle weakness, decreased cardiorespiratoy endurance and pain.  Prior to hospitalization, patient could complete BADLs with independent .  Patient will benefit from skilled intervention to increase independence with basic self-care skills prior to discharge home with care partner.  Anticipate patient will require 24 hour supervision and minimal physical assistance and follow up home health.  OT - End of Session Endurance Deficit:  (unable to thoroughly assess, pt needed tx to be bedlevel due to heightened pain and inability to receive her pain medicine at that time, pt also very teary during session regarding her medical situation) OT Assessment Rehab Potential (ACUTE ONLY): Fair OT Barriers to Discharge: Wound Care;Lack of/limited family support;Weight bearing restrictions;Behavior OT Patient demonstrates impairments in the following area(s): Balance;Motor;Pain;Safety;Sensory;Skin Integrity OT Basic ADL's Functional Problem(s): Grooming;Bathing;Dressing;Toileting OT Advanced ADL's Functional Problem(s): Simple Meal Preparation OT Transfers Functional Problem(s): Toilet;Tub/Shower OT Additional Impairment(s): Fuctional Use of Upper Extremity (per MD, no weightbearing through distal digits with gangrenous changes) OT Plan OT Intensity: Minimum of 1-2 x/day, 45 to 90 minutes OT Frequency: 5 out of 7 days OT Duration/Estimated Length of Stay: 3-4 weeks OT  Treatment/Interventions: Balance/vestibular training;DME/adaptive equipment instruction;Patient/family education;Therapeutic Activities;Wheelchair propulsion/positioning;Therapeutic Exercise;Psychosocial support;Community reintegration;Functional mobility training;Self Care/advanced  ADL retraining;UE/LE Strength taining/ROM;UE/LE Coordination activities;Skin care/wound managment;Discharge planning;Disease mangement/prevention;Pain management;Splinting/orthotics OT Self Feeding Anticipated Outcome(s): No goal OT Basic Self-Care Anticipated Outcome(s): Min A OT Toileting Anticipated Outcome(s): Min A OT Bathroom Transfers Anticipated Outcome(s): Min A OT Recommendation Recommendations for Other Services: Neuropsych consult Patient destination: Home Follow Up Recommendations: 24 hour supervision/assistance Equipment Recommended: To be determined   OT Evaluation Precautions/Restrictions  Precautions Precautions: Fall Precaution Comments: Gangrenous changes in hands Rt>Lt, open foot wounds bilaterally Restrictions Weight Bearing Restrictions: Yes RLE Weight Bearing: Weight bearing as tolerated LLE Weight Bearing: Weight bearing as tolerated Other Position/Activity Restrictions: WBAT B LEs while wearing compression socks + Darco shoes General   Vital Signs   Pain Pain Assessment Pain Scale: 0-10 Pain Score: 10-Worst pain ever Pain Type: Acute pain Pain Location: Foot Pain Descriptors / Indicators: Aching Pain Onset: On-going Pain Intervention(s): Medication (See eMAR) Home Living/Prior Functioning Home Living Family/patient expects to be discharged to:: Private residence Living Arrangements: Spouse/significant other Available Help at Discharge: Family,Available 24 hours/day Home Access: Level entry Home Layout: One level Bathroom Shower/Tub: Tub/shower Psychologist, occupational: Standard  Lives With: Office manager (Comment) (lives with spouse and 87 y/o grandson) IADL  History Homemaking Responsibilities: Yes (Pt reports being independent with all IADL tasks PTA- meal prep, cleaning, driving to run errands, child care, etc. Note she became teary when providing PLOF information) Occupation: Other (comment) Type of Occupation: Does not currently work now, takes care of her 66 y/o grandson. Pt used to be a phlabotomist Leisure and Hobbies: Per pt "I don't remember anymore" and she became tearful Prior Function Level of Independence: Independent with basic ADLs,Independent with homemaking with ambulation Driving: Yes Vision Baseline Vision/History: Wears glasses Wears Glasses: Reading only Patient Visual Report: No change from baseline Vision Assessment?: No apparent visual deficits Perception  Perception: Within Functional Limits Praxis Praxis: Intact Cognition Overall Cognitive Status: Within Functional Limits for tasks assessed Arousal/Alertness: Awake/alert Orientation Level: Person;Place;Situation Person: Oriented Place: Oriented Situation: Oriented Year: 2022 Month: May Day of Week: Correct Memory: Appears intact Immediate Memory Recall: Sock;Blue;Bed Memory Recall Sock: Without Cue Memory Recall Blue: Without Cue Memory Recall Bed: Without Cue Awareness: Appears intact Safety/Judgment: Appears intact Sensation Sensation Light Touch: Impaired Detail (Absent sensation gangrenous areas of upper/lower limbs) Coordination Gross Motor Movements are Fluid and Coordinated: Not tested (not thoroughly tested as tx was only bedlevel) Fine Motor Movements are Fluid and Coordinated: No (affected by gangrenous changes in fingers) Finger Nose Finger Test: WNL bilaterally Motor  Motor Motor: Other (comment) Motor - Skilled Clinical Observations: generalized weakness and endurance deficits, pain limiting  Trunk/Postural Assessment  Cervical Assessment Cervical Assessment:  (unable to assess) Thoracic Assessment Thoracic Assessment:  (unable to  assess) Lumbar Assessment Lumbar Assessment:  (unable to assess) Postural Control Postural Control:  (unable to assess)  Balance Balance Balance Assessed: No Extremity/Trunk Assessment RUE Assessment RUE Assessment:  (unable to thoroughly assess, gangrenous changes Rt>Lt hand in digits distally) LUE Assessment LUE Assessment:  (unable to thoroughly assess, gangrenous changes Rt>Lt hand in digits distally)  Care Tool Care Tool Self Care Eating   Eating Assist Level: Set up assist    Oral Care    Oral Care Assist Level: Set up assist    Bathing   Body parts bathed by patient: Right arm;Left arm;Chest;Abdomen;Front perineal area;Face Body parts bathed by helper: Buttocks;Right upper leg;Left upper leg;Right lower leg;Left lower leg   Assist Level: 2 Helpers    Upper Body Dressing(including orthotics)   What is the  patient wearing?: Hospital gown only   Assist Level: Moderate Assistance - Patient 50 - 74%    Lower Body Dressing (excluding footwear)          Putting on/Taking off footwear   What is the patient wearing?: Ted hose (specific compression socks ordered by MD) Assist for footwear: 2 Benld activity   Assist for toileting: 2 Helpers (urinal)         Toilet transfer Toilet transfer activity did not occur: Safety/medical concerns (due to pts heightened pain and absence of Darco shoes in room)       Care Tool Cognition Expression of Ideas and Wants Expression of Ideas and Wants: Some difficulty - exhibits some difficulty with expressing needs and ideas (e.g, some words or finishing thoughts) or speech is not clear   Understanding Verbal and Non-Verbal Content Understanding Verbal and Non-Verbal Content: Usually understands - understands most conversations, but misses some part/intent of message. Requires cues at times to understand   Memory/Recall Ability *first 3 days only Memory/Recall Ability *first 3 days only:  Staff names and faces;That he or she is in a hospital/hospital unit;Current season    Refer to Care Plan for Long Term Goals  SHORT TERM GOAL WEEK 1 OT Short Term Goal 1 (Week 1): Pt will sit EOB for ~5 minutes while engaged in an ADL task to increase upright tolerance OT Short Term Goal 2 (Week 1): Pt will complete LB dressing at sit<stand level with LRAD OT Short Term Goal 3 (Week 1): Pt don an overhead shirt with no more than Mod A using adaptive strategies as needed  Recommendations for other services: Neuropsych   Skilled Therapeutic Intervention Skilled OT session completed with focus on initial evaluation, education on OT role/POC, and establishment of patient-centered goals.   Pt greeted in bed, reporting not having pain medicine ordered so has not yet taken pain medicine. Agreeable to participate in OT as able. Tried to sit EOB with pt reporting too much pain during transition, adamant to return to bed and then began crying. With bed placed in chair position, she engaged in bathing/dressing tasks per her tolerance. RN and OT doffed her compression socks with pt very tearful/in pain. OT tried to find an adaptive compression sock donner but unable. Pt refused to have compression socks donned before getting her pain medicine. +2 for toileting using the female urinal. Pt with necrotic fingertips and open wounds on both feet, necrotic Rt heel. Pt with absent sensation over black areas of upper/lower limbs. Per MD, pt WBAT bilaterally with Darco shoes. No Darco shoes in the room, nursing made aware. Pt remained in care of NT at end of tx.  ADL ADL Eating: Not assessed Grooming: Moderate assistance Where Assessed-Grooming: Bed level Upper Body Bathing: Supervision/safety Where Assessed-Upper Body Bathing: Bed level Lower Body Bathing: Other (comment) (+2 assist) Where Assessed-Lower Body Bathing: Bed level Upper Body Dressing: Moderate assistance Where Assessed-Upper Body Dressing: Bed  level Lower Body Dressing: Other (Comment) (+2 assist) Where Assessed-Lower Body Dressing: Bed level Toileting: Other (Comment) (+2 assist) Where Assessed-Toileting: Bed level Toilet Transfer: Not assessed Tub/Shower Transfer: Not assessed ADL Comments: ADL assessment very limited due to heightened pain Mobility      Discharge Criteria: Patient will be discharged from OT if patient refuses treatment 3 consecutive times without medical reason, if treatment goals not met, if there is a change in medical status, if patient makes no progress towards goals  or if patient is discharged from hospital.  The above assessment, treatment plan, treatment alternatives and goals were discussed and mutually agreed upon: by patient  Skeet Simmer 11/23/2020, 12:37 PM

## 2020-11-23 NOTE — Progress Notes (Signed)
Orthopedic Tech Progress Note Patient Details:  [name] [name] 06-09-1961 [id number]  Ortho Devices Type of Ortho Device: Darco shoe Ortho Device/Splint Location: Bilateral LE Ortho Device/Splint Interventions: Ordered       [name] [name] 11/23/2020, 11:10 AM

## 2020-11-23 NOTE — Progress Notes (Signed)
Inpatient Rehabilitation Medication Review by a Pharmacist  A complete drug regimen review was completed for this patient to identify any potential clinically significant medication issues.  Clinically significant medication issues were identified:  yes   Type of Medication Issue Identified Description of Issue Urgent (address now) Non-Urgent (address on AM team rounds) Plan Plan Accepted by Provider? (Yes / No / Pending AM Rounds)  Drug Interaction(s) (clinically significant)       Duplicate Therapy       Allergy       No Medication Administration End Date       Incorrect Dose       Additional Drug Therapy Needed       Other  Nystatin S&S 5 mL PO QID til 5/13 per discharge med list from [facility], on med list in rehab  Scopolamine patch q72 hours until 5/14 per discharge med list from [facility], on med list in rehab. Per [facility] on 5/13 RN states patch was applied "earlier today", f/u adjust next patch due 5/16 Non-urgent Notify provider  Pending response    Name of provider notified: Dr. Wynn Banker  Provider Method of Notification: Secure chat   Time spent performing this drug regimen review (minutes):  20 minutes (most time spent locating paper [facility] chart, not accessible electronically)  Laverna Peace, PharmD PGY-1 Pharmacy Resident 11/23/2020 3:22 PM Please see AMION for all pharmacy numbers

## 2020-11-24 DIAGNOSIS — R5381 Other malaise: Secondary | ICD-10-CM | POA: Diagnosis not present

## 2020-11-24 MED ORDER — GABAPENTIN 300 MG PO CAPS
300.0000 mg | ORAL_CAPSULE | Freq: Three times a day (TID) | ORAL | Status: DC
  Administered 2020-11-24 – 2020-11-25 (×3): 300 mg via ORAL
  Filled 2020-11-24 (×3): qty 1

## 2020-11-24 NOTE — Progress Notes (Signed)
PROGRESS NOTE   Subjective/Complaints:  Has tingling / pain Bilateral feet, was relieved by gabapentin which pt reports taking during LTACH stay   ROS- No CP, SOB, N/V/D Objective:   No results found. No results for input(s): WBC, HGB, HCT, PLT in the last 72 hours. No results for input(s): NA, K, CL, CO2, GLUCOSE, BUN, CREATININE, CALCIUM in the last 72 hours.  Intake/Output Summary (Last 24 hours) at 11/24/2020 1031 Last data filed at 11/24/2020 0900 Gross per 24 hour  Intake 720 ml  Output 700 ml  Net 20 ml        Physical Exam: Vital Signs Blood pressure 116/75, pulse 91, temperature 98.3 F (36.8 C), temperature source Oral, resp. rate 18, height 5\' 4"  (1.626 m), weight 85.7 kg, last menstrual period 11/22/2020, SpO2 96 %.    General: No acute distress Mood and affect are appropriate Heart: Regular rate and rhythm no rubs murmurs or extra sounds Lungs: Clear to auscultation, breathing unlabored, no rales or wheezes Abdomen: Positive bowel sounds, soft nontender to palpation, nondistended Extremities: No clubbing, cyanosis, or edema Skin: dry gangrene on finger tips an dtips of toes, Right heel eschar  Neurologic: Cranial nerves II through XII intact, motor strength is 4/5 in bilateral deltoid, bicep, tricep, grip, 3-hip flexor, 4- knee extensors, 3- ankle dorsiflexor and plantar flexor  Musculoskeletal: Full range of motion in all 4 extremities. No joint swelling  Assessment/Plan: 1. Functional deficits which require 3+ hours per day of interdisciplinary therapy in a comprehensive inpatient rehab setting.  Physiatrist is providing close team supervision and 24 hour management of active medical problems listed below.  Physiatrist and rehab team continue to assess barriers to discharge/monitor patient progress toward functional and medical goals  Care Tool:  Bathing    Body parts bathed by patient: Right  arm,Left arm,Chest,Abdomen,Front perineal area,Face   Body parts bathed by helper: Buttocks,Right upper leg,Left upper leg,Right lower leg,Left lower leg     Bathing assist Assist Level: 2 Helpers     Upper Body Dressing/Undressing Upper body dressing   What is the patient wearing?: Hospital gown only    Upper body assist Assist Level: Moderate Assistance - Patient 50 - 74%    Lower Body Dressing/Undressing Lower body dressing      What is the patient wearing?: Hospital gown only     Lower body assist Assist for lower body dressing: Maximal Assistance - Patient 25 - 49%     Toileting Toileting    Toileting assist Assist for toileting: 2 Helpers (urinal)     Transfers Chair/bed transfer  Transfers assist     Chair/bed transfer assist level: Dependent - mechanical lift     Locomotion Ambulation   Ambulation assist   Ambulation activity did not occur: Safety/medical concerns          Walk 10 feet activity   Assist  Walk 10 feet activity did not occur: Safety/medical concerns        Walk 50 feet activity   Assist Walk 50 feet with 2 turns activity did not occur: Safety/medical concerns         Walk 150 feet activity   Assist Walk 150  feet activity did not occur: Safety/medical concerns         Walk 10 feet on uneven surface  activity   Assist Walk 10 feet on uneven surfaces activity did not occur: Safety/medical concerns         Wheelchair     Assist Will patient use wheelchair at discharge?: Yes Type of Wheelchair: Power    Wheelchair assist level: Moderate Assistance - Patient 50 - 74% Max wheelchair distance: 150    Wheelchair 50 feet with 2 turns activity    Assist        Assist Level: Moderate Assistance - Patient 50 - 74%   Wheelchair 150 feet activity     Assist      Assist Level: Moderate Assistance - Patient 50 - 74%   Blood pressure 116/75, pulse 91, temperature 98.3 F (36.8 C),  temperature source Oral, resp. rate 18, height 5\' 4"  (1.626 m), weight 85.7 kg, last menstrual period 11/22/2020, SpO2 96 %.   Medical Problem List and Plan: 1.  Deficits with endurance, mobility, transfers, self-care secondary to debility.              -patient may not shower             -ELOS/Goals: 17-20 days/Min A             Admit to CIR PT, OT 2.  PE/Antithrombotics: -DVT/anticoagulation:  Pharmaceutical: Other (comment)--Eliquis- discussed with Pt and son (at bedside )              -antiplatelet therapy: N/A 3. Pain Management: Oxycodone 5mg  q 6 h prn, also  Gabapentin 300mg  BID - increase to TID              Monitor with increased exertion 4. Mood: LCSW to follow for evaluation and support. Team to offer ego support.              -antipsychotic agents: N/A 5. Neuropsych: This patient is capable of making decisions on her own behalf. 6. Skin/Wound Care:              Prevalon boots for use when in bed. Continue left hand splint.              --Darco shoe for weight bearing per PT input. No sig changes vs pictures taken 5/6 in ortho consult  7. Fluids/Electrolytes/Nutrition: Monitor  I/Os             CMP ordered             Added protein  supplements to help promote wound healing.  8.  PAF: Monitor HR tid--continue Metoprolol and Cardizem for rate control.             Vitals:   11/23/20 2237 11/24/20 0459  BP: 113/76 116/75  Pulse: 90 91  Resp:  18  Temp:  98.3 F (36.8 C)  SpO2:  96%   9. Dysphagia: Continue aspiration precautions--No straws.               Advance diet as tolerated 10. ABLA              CBC ordered    LOS: 2 days A FACE TO FACE EVALUATION WAS PERFORMED  11/24/2020, 10:31 AM

## 2020-11-24 NOTE — Progress Notes (Signed)
Patient refused to place black socks claims it hurts . She was ok with the tegaderm placed yesterday on the feet  and request to let them dry first at this time.

## 2020-11-25 ENCOUNTER — Other Ambulatory Visit: Payer: Self-pay

## 2020-11-25 ENCOUNTER — Encounter (HOSPITAL_COMMUNITY): Payer: Self-pay | Admitting: Physical Medicine and Rehabilitation

## 2020-11-25 DIAGNOSIS — R5381 Other malaise: Secondary | ICD-10-CM | POA: Diagnosis not present

## 2020-11-25 DIAGNOSIS — G7281 Critical illness myopathy: Secondary | ICD-10-CM | POA: Diagnosis present

## 2020-11-25 DIAGNOSIS — F32A Depression, unspecified: Secondary | ICD-10-CM | POA: Diagnosis present

## 2020-11-25 LAB — CBC WITH DIFFERENTIAL/PLATELET
Abs Immature Granulocytes: 0.2 10*3/uL — ABNORMAL HIGH (ref 0.00–0.07)
Basophils Absolute: 0.1 10*3/uL (ref 0.0–0.1)
Basophils Relative: 1 %
Eosinophils Absolute: 0.1 10*3/uL (ref 0.0–0.5)
Eosinophils Relative: 1 %
HCT: 36.4 % (ref 36.0–46.0)
Hemoglobin: 11.3 g/dL — ABNORMAL LOW (ref 12.0–15.0)
Immature Granulocytes: 2 %
Lymphocytes Relative: 37 %
Lymphs Abs: 3.3 10*3/uL (ref 0.7–4.0)
MCH: 30.1 pg (ref 26.0–34.0)
MCHC: 31 g/dL (ref 30.0–36.0)
MCV: 96.8 fL (ref 80.0–100.0)
Monocytes Absolute: 0.8 10*3/uL (ref 0.1–1.0)
Monocytes Relative: 9 %
Neutro Abs: 4.6 10*3/uL (ref 1.7–7.7)
Neutrophils Relative %: 50 %
Platelets: 400 10*3/uL (ref 150–400)
RBC: 3.76 MIL/uL — ABNORMAL LOW (ref 3.87–5.11)
RDW: 16.8 % — ABNORMAL HIGH (ref 11.5–15.5)
WBC: 9.1 10*3/uL (ref 4.0–10.5)
nRBC: 0 % (ref 0.0–0.2)

## 2020-11-25 LAB — COMPREHENSIVE METABOLIC PANEL
ALT: 27 U/L (ref 0–44)
AST: 35 U/L (ref 15–41)
Albumin: 2.8 g/dL — ABNORMAL LOW (ref 3.5–5.0)
Alkaline Phosphatase: 123 U/L (ref 38–126)
Anion gap: 10 (ref 5–15)
BUN: <5 mg/dL — ABNORMAL LOW (ref 6–20)
CO2: 26 mmol/L (ref 22–32)
Calcium: 9.3 mg/dL (ref 8.9–10.3)
Chloride: 101 mmol/L (ref 98–111)
Creatinine, Ser: 0.44 mg/dL (ref 0.44–1.00)
GFR, Estimated: >60 mL/min (ref >60)
Glucose, Bld: 100 mg/dL — ABNORMAL HIGH (ref 70–99)
Potassium: 4 mmol/L (ref 3.5–5.1)
Sodium: 137 mmol/L (ref 135–145)
Total Bilirubin: 0.7 mg/dL (ref 0.3–1.2)
Total Protein: 7.6 g/dL (ref 6.5–8.1)

## 2020-11-25 MED ORDER — GABAPENTIN 400 MG PO CAPS
400.0000 mg | ORAL_CAPSULE | Freq: Three times a day (TID) | ORAL | Status: DC
  Administered 2020-11-25 – 2020-11-27 (×6): 400 mg via ORAL
  Filled 2020-11-25 (×6): qty 1

## 2020-11-25 MED ORDER — ESCITALOPRAM OXALATE 10 MG PO TABS
20.0000 mg | ORAL_TABLET | Freq: Every day | ORAL | Status: DC
  Administered 2020-11-26 – 2020-12-09 (×14): 20 mg via ORAL
  Filled 2020-11-25 (×14): qty 2

## 2020-11-25 MED ORDER — PANTOPRAZOLE SODIUM 40 MG PO TBEC: 40.0000 mg | DELAYED_RELEASE_TABLET | Freq: Every day | ORAL | Status: DC

## 2020-11-25 MED ORDER — OXYCODONE HCL 5 MG PO TABS
5.0000 mg | ORAL_TABLET | ORAL | Status: DC | PRN
  Administered 2020-11-25 – 2020-12-23 (×79): 5 mg via ORAL
  Filled 2020-11-25 (×81): qty 1

## 2020-11-25 NOTE — Progress Notes (Signed)
Occupational Therapy Session Note  Patient Details  Name: [name] [name] MRN: [id number] Date of Birth: 11/07/1960  Today's Date: 11/25/2020 OT Individual Time: 7616-0737 OT Individual Time Calculation (min): 40 min    Short Term Goals: Week 1:  OT Short Term Goal 1 (Week 1): Pt will sit EOB for ~5 minutes while engaged in an ADL task to increase upright tolerance OT Short Term Goal 2 (Week 1): Pt will complete LB dressing at sit<stand level with LRAD OT Short Term Goal 3 (Week 1): Pt don an overhead shirt with no more than Mod A using adaptive strategies as needed   Skilled Therapeutic Interventions/Progress Updates:    Pt greeted at time of session semireclined in bed resting eating breakfast and spent initial part of session recounting history of her illness, noted to have cognitive deficits with recall. Pt requesting a few minutes to eat breakfast since trays had been delivered later this morning. Returning 15 minutes later, pt agreeable to OT session. Trialed sock aide, unable to tolerate and noted to put some pressure on heel so task terminated. Attempted to find clear sock aide/strinker device but unable to assist with pt discomfort. Pt also insisting that she needs nerve pain meds prior to OT session, spoke with RN who is aware. MD entered at this time and performing rounds on the pt, recommended ace wraps instead of compression hose. Pt's BLEs ace wrapped instead and pt reported feeling okay. Hand off to SLP and all needs met. Note pt became emotional during session, emotional support provided.    Therapy Documentation Precautions:  Precautions Precautions: Fall Precaution Comments: Gangrenous changes in hands Rt>Lt, open foot wounds bilaterally Restrictions Weight Bearing Restrictions: Yes RLE Weight Bearing: Weight bearing as tolerated LLE Weight Bearing: Weight bearing as tolerated Other Position/Activity Restrictions: WBAT B LEs while wearing compression socks + Darco  shoes     Therapy/Group: Individual Therapy  [name] 11/25/2020, 7:13 AM

## 2020-11-25 NOTE — Progress Notes (Signed)
Physical Therapy Session Note  Patient Details  Name: [name] [name] MRN: [id number] Date of Birth: 1960/09/28  Today's Date: 11/25/2020 PT Individual Time: 0930-1015 PT Individual Time Calculation (min): 45 min   Short Term Goals: Week 1:  PT Short Term Goal 1 (Week 1): Pt will perform bed mobility with mod assist PT Short Term Goal 2 (Week 1): Pt will perform SB transfer with max A +2 PT Short Term Goal 3 (Week 1): Pt will tolerate sitting in WC >2 hours between therapies PT Short Term Goal 4 (Week 1): Pt will perform sit>stand with +2 assist Week 2:    Week 3:     Skilled Therapeutic Interventions/Progress Updates:    Pain:  Pt reports back, LE  Pain  w/mobility  Treatment to tolerance.  Rest breaks and repositioning as needed.  Pt initially supine and agreeable to treatment session w/focus on mobilization, ADLs.  Pt able to assist w/lifting feet to remove pillows/thread shorts, AAROM to cross legs then rolls w/mod assist To complete dressing.  Sit to sit w/mod to max assist of 1.  Initially pt min assist for balance, post lean but able to adjust to upright then sits w/cga. Pt doffs gown and dons shirt w/min to mod assist.   In sitting, pt performed LAQs 4 x 5reps each.  L ankle DF/PF in limited range.   Darco shoes donned by therapist for protection w transfer. Pt instructed w/use of sliding board.  Able to wt shift R for placement of board by therapist w/mod assist.  Bed to wc SBT total assist.  Pt repositioned in wc w/use of PWC features and max assist.  Pt brushed teeth w/set up assist.  Pt instructed w/operation of tilt feature for comfort.  Discussed attempting to remain oob in wc x 1 hr, calling nursing for return to bed via [employer].  Pt agreeable.  NT notified and agreed.   Therapy Documentation Precautions:  Precautions Precautions: Fall Precaution Comments: Gangrenous changes in hands Rt>Lt, open foot wounds bilaterally Restrictions Weight Bearing Restrictions:  Yes RLE Weight Bearing: Weight bearing as tolerated LLE Weight Bearing: Weight bearing as tolerated Other Position/Activity Restrictions: WBAT B LEs while wearing compression socks + Darco shoes    Therapy/Group: Individual Therapy  Rada Hay, PT   [name] [name] 11/25/2020, 12:40 PM

## 2020-11-25 NOTE — Progress Notes (Signed)
[facility] Individual Statement of Services  Patient Name:  [name] [name]  Date:  11/25/2020  Welcome to the [facility]patient [facility].  Our goal is to provide you with an individualized program based on your diagnosis and situation, designed to meet your specific needs.  With this comprehensive rehabilitation program, you will be expected to participate in at least 3 hours of rehabilitation therapies Monday-Friday, with modified therapy programming on the weekends.  Your rehabilitation program will include the following services:  Physical Therapy (PT), Occupational Therapy (OT), Speech Therapy (ST), 24 hour per day rehabilitation nursing, Therapeutic Recreaction (TR), Neuropsychology, Care Coordinator, Rehabilitation Medicine, Nutrition Services, Pharmacy Services and Other  Weekly team conferences will be held on Tuesdays to discuss your progress.  Your Inpatient Rehabilitation Care Coordinator will talk with you frequently to get your input and to update you on team discussions.  Team conferences with you and your family in attendance may also be held.  Expected length of stay: 2-3 Weeks   Overall anticipated outcome: Min A  Depending on your progress and recovery, your program may change. Your Inpatient Rehabilitation Care Coordinator will coordinate services and will keep you informed of any changes. Your Inpatient Rehabilitation Care Coordinator's name and contact numbers are listed  below.  The following services may also be recommended but are not provided by the Inpatient Rehabilitation Center:    Home Health Rehabiltiation Services  Outpatient Rehabilitation Services    Arrangements will be made to provide these services after discharge if needed.  Arrangements include referral to agencies that provide these services.  Your insurance has been verified to be:  [employer] primary doctor is:  NO PCP  Pertinent information will be shared  with your doctor and your insurance company.  Inpatient Rehabilitation Care Coordinator:  Lavera Guise, [place] [phone] or [phone]  Information discussed with and copy given to patient by: Andria Rhein, 11/25/2020, 11:49 AM

## 2020-11-25 NOTE — Progress Notes (Signed)
PROGRESS NOTE   Subjective/Complaints:  Pt reports they "increased her gabapentin" yesterday and it's helpful for burning and tingling, but doesn't last all night/the whole time- we discussed, will increase to 400 mg q8hours so can get  Also pain poorly controlled-will increase oxy to q4 hours prn-    Pain in extremities, but Esp from black socks which she say hurts more than anything else- and pulls "off good skin".   Bowels going daily and voiding well.    ROS- Pt denies SOB, abd pain, CP, N/V/C/D, and vision changes  Objective:   No results found. Recent Labs    11/25/20 1027  WBC 9.1  HGB 11.3*  HCT 36.4  PLT 400   Recent Labs    11/25/20 1027  NA 137  K 4.0  CL 101  CO2 26  GLUCOSE 100*  BUN <5*  CREATININE 0.44  CALCIUM 9.3    Intake/Output Summary (Last 24 hours) at 11/25/2020 1921 Last data filed at 11/25/2020 1720 Gross per 24 hour  Intake 380 ml  Output 1775 ml  Net -1395 ml        Physical Exam: Vital Signs Blood pressure 117/64, pulse 89, temperature 98.2 F (36.8 C), temperature source Oral, resp. rate 16, height 5\' 4"  (1.626 m), weight 84.8 kg, last menstrual period 11/22/2020, SpO2 97 %.     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 No joint swelling  Assessment/Plan: 1. Functional deficits which require 3+ hours per day of interdisciplinary therapy in a comprehensive inpatient rehab setting.  Physiatrist is providing close team supervision and 24 hour management of active medical problems listed below.  Physiatrist and rehab team continue to assess barriers to discharge/monitor patient progress toward functional and medical goals  Care Tool:  Bathing    Body parts bathed by patient: Right arm,Left arm,Chest,Abdomen,Front perineal area,Face   Body parts bathed by helper: Buttocks,Right upper leg,Left upper leg,Right lower leg,Left lower leg     Bathing assist Assist Level: 2 Helpers     Upper Body Dressing/Undressing Upper body dressing   What is the patient wearing?: Pull over shirt    Upper body assist Assist Level: Moderate Assistance - Patient 50 - 74%    Lower Body Dressing/Undressing Lower body dressing      What is the patient wearing?: Pants     Lower body assist Assist for lower body dressing: Maximal Assistance - Patient 25 - 49%     Toileting Toileting    Toileting assist Assist for toileting: Dependent -  Patient 0%     Transfers Chair/bed transfer  Transfers assist     Chair/bed transfer assist level: Dependent - mechanical lift     Locomotion Ambulation   Ambulation assist   Ambulation activity did not occur: Safety/medical concerns          Walk 10 feet activity   Assist  Walk 10 feet activity did not occur: Safety/medical concerns        Walk 50 feet activity   Assist Walk 50 feet with 2 turns activity did not occur: Safety/medical concerns         Walk 150 feet activity   Assist Walk 150 feet activity did not occur: Safety/medical concerns         Walk 10 feet on uneven surface  activity   Assist Walk 10 feet on uneven surfaces activity did not occur: Safety/medical concerns          Wheelchair     Assist Will patient use wheelchair at discharge?: Yes Type of Wheelchair: Power    Wheelchair assist level: Moderate Assistance - Patient 50 - 74% Max wheelchair distance: 150    Wheelchair 50 feet with 2 turns activity    Assist        Assist Level: Moderate Assistance - Patient 50 - 74%   Wheelchair 150 feet activity     Assist      Assist Level: Moderate Assistance - Patient 50 - 74%   Blood pressure 117/64, pulse 89, temperature 98.2 F (36.8 C), temperature source Oral, resp. rate 16, height 5\' 4"  (1.626 m), weight 84.8 kg, last menstrual period 11/22/2020, SpO2 97 %.   Medical Problem List and Plan: 1.  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 con't PT and OT as tolerated 2.  PE/Antithrombotics: -DVT/anticoagulation:  Pharmaceutical: Other (comment)--Eliquis- discussed with Pt and son (at bedside )              -antiplatelet therapy: N/A 3. Pain Management: Oxycodone 5mg  q 6 h prn, also  Gabapentin 300mg  BID - increase to TID  5/16- increased gabapentin to 400 mg q8 hours so more frequent.               Monitor with increased exertion 4. Mood: LCSW to follow for evaluation and support. Team to offer ego support.   5/16- will increase Lexapro to 20 mg daily             -antipsychotic agents: N/A 5. Neuropsych: This patient is capable of making decisions on her own behalf. 6. Skin/Wound Care:              Prevalon boots for use when in bed. Continue left hand splint.              --Darco shoe for weight bearing per PT input. No sig changes vs pictures taken 5/6 in ortho consult  7. Fluids/Electrolytes/Nutrition: Monitor  I/Os             CMP ordered             Added protein   supplements  to help promote wound healing.  8.  PAF: Monitor HR tid--continue Metoprolol and Cardizem for rate control.             Vitals:   11/25/20 0440 11/25/20 1259  BP: 108/66 117/64  Pulse: 100 89  Resp: 18 16  Temp: 98 F (36.7 C) 98.2 F (36.8 C)  SpO2: 94% 97%   5/16- BP soft, but no orthostasis per nursing; con't regimen 9. Dysphagia: Continue aspiration precautions--No straws.               Advance diet as tolerated  5/16- SLP to address/evaluate- con't regimen 10. ABLA              CBC ordered  5/16- Hb 11.3- better- con't regimen    LOS: 3 days A FACE TO FACE EVALUATION WAS PERFORMED  [name] [name] 11/25/2020, 7:21 PM

## 2020-11-25 NOTE — Progress Notes (Signed)
Speech Language Pathology Daily Session Note  Patient Details  Name: [name] [name] MRN: [id number] Date of Birth: 1960/09/22  Today's Date: 11/25/2020 SLP Individual Time: 8016-5537 SLP Individual Time Calculation (min): 40 min  Short Term Goals: Week 1: SLP Short Term Goal 1 (Week 1): Pt will demonstrate complex problem solving in functional task (ex: medication/money/time management) with supervison A verbal cues. SLP Short Term Goal 2 (Week 1): Pt will demonstrate alternating attention during complex tasks in 10 minute intervals with supervision A verbal cues. SLP Short Term Goal 3 (Week 1): Pt will demonstrate recall of novel, daily information with supervision A verbal cues. SLP Short Term Goal 4 (Week 1): Pt will participate in higher level word finding task (ex: divergent and convergent naming) with supervision A semantic cues.  Skilled Therapeutic Interventions: Skilled SLP intervention focused on cognition. Pt completed visual problem solving task of increased complexity using calendar and dates with min A. Increased time needed to locate information to determine solutions. Pt demonstrated adequate word finding in conversation this session. Education completed on word finding strategies to use during conversation. Cont with therapy per plan of care.      Pain Pain Assessment Pain Scale: Faces Pain Score: 10-Worst pain ever Faces Pain Scale: No hurt Pain Location: Foot Pain Orientation: Right;Left Pain Intervention(s): Medication (See eMAR)  Therapy/Group: Individual Therapy  Carlean Jews Garland Smouse 11/25/2020, 8:49 AM

## 2020-11-25 NOTE — Progress Notes (Signed)
Physical Therapy Session Note  Patient Details  Name: [name] [name] MRN: [id number] Date of Birth: February 09, 1961  Today's Date: 11/25/2020 PT Individual Time: 1300-1415 PT Individual Time Calculation (min): 75 min   Short Term Goals: Week 1:  PT Short Term Goal 1 (Week 1): Pt will perform bed mobility with mod assist PT Short Term Goal 2 (Week 1): Pt will perform SB transfer with max A +2 PT Short Term Goal 3 (Week 1): Pt will tolerate sitting in WC >2 hours between therapies PT Short Term Goal 4 (Week 1): Pt will perform sit>stand with +2 assist  Skilled Therapeutic Interventions/Progress Updates:    pt received in bed and agreeable to therapy. Pt then directed in supine>sit with rolling to R mod A x2 with difficulty rolling hips and majority of assist for BLE management. Pt then directed in static sitting EOB for 15 mins CGA, initially min A for positioning. Pt directed in BLE strengthening exercises hip flexion, knee extension, hip abduction and adduction 2x20 with VC and tactile cues for technique. Pt then required to return to supine for pain management in BLE, max A for BLE management. Pt then directed in supine exercises of 2x10 transverse abdominus contractions without valsalva, 2x10 quad sets, glute squeezes and gentle RLE DF stretches. Pt educated on mobility in bed, decreased intensity of exercises based on fatigue levels. Pt directed in returning to EOB mod A x2, and additional BLE strengthening exercises 2x10 LAQ, hip flexion for 10 mins with rest breaks and static sitting SBA, dynamic CGA. Pt directed in multiple repositioning in bed during session max Ax2 for upward scooting. Pt directed in donning shorts at start of session and doffing at end of session max A. Pt left in bed, All needs in reach and in good condition. Call light in hand.  Nursing present at end of session.   Therapy Documentation Precautions:  Precautions Precautions: Fall Precaution Comments: Gangrenous changes in  hands Rt>Lt, open foot wounds bilaterally Restrictions Weight Bearing Restrictions: Yes RLE Weight Bearing: Weight bearing as tolerated LLE Weight Bearing: Weight bearing as tolerated Other Position/Activity Restrictions: WBAT B LEs while wearing compression socks + Darco shoes General:   Vital Signs: Therapy Vitals Temp: 98.2 F (36.8 C) Temp Source: Oral Pulse Rate: 89 Resp: 16 BP: 117/64 Patient Position (if appropriate): Lying Oxygen Therapy SpO2: 97 % O2 Device: Room Air Pain: Pain Assessment Pain Score: 5  Mobility:   Locomotion :    Trunk/Postural Assessment :    Balance:   Exercises:   Other Treatments:      Therapy/Group: Individual Therapy  Barbaraann Faster 11/25/2020, 3:32 PM

## 2020-11-25 NOTE — Progress Notes (Signed)
Inpatient Rehabilitation Care Coordinator Assessment and Plan Patient Details  Name: [name] [name] MRN: [id number] Date of Birth: 09-Mar-1961  Today's Date: 11/25/2020  Hospital Problems: Principal Problem:   Debility  Past Medical History:  Past Medical History:  Diagnosis Date  . Depression   . Generalized anxiety disorder   . NSTEMI (non-ST elevated myocardial infarction) [facility])    Past Surgical History:  Past Surgical History:  Procedure Laterality Date  . CARDIAC CATHETERIZATION  09/2020   Social History:  reports that she has never smoked. She has never used smokeless tobacco. She reports previous alcohol use. She reports that she does not use drugs.  Family / Support Systems Marital Status: Married Patient Roles: Spouse Spouse/Significant Other: Dwight Children: 3 children (adults ) Other Supports: pt sisters Anticipated Caregiver: Spouse and pt sisters Ability/Limitations of Caregiver: none Caregiver Availability: 24/7 Family Dynamics: Pt has small family of spouse and sisters. Dtr and other family members passed away within last 6 months  Social History Preferred language: English Religion:  Read: Yes Write: Yes Employment Status: Employed Public relations account executive Issues: n/a Guardian/Conservator: n/a   Abuse/Neglect Abuse/Neglect Assessment Can Be Completed: Yes Physical Abuse: Denies Verbal Abuse: Denies Sexual Abuse: Denies Exploitation of patient/patient's resources: Denies Self-Neglect: Denies  Emotional Status Pt's affect, behavior and adjustment status: pt very emotional. Dtr passed away 05-Jun-2020 from overdose, coping Recent Psychosocial Issues: n/a Psychiatric History: n/a Substance Abuse History: n/a  Patient / Family Perceptions, Expectations & Goals Pt/Family understanding of illness & functional limitations: yes Premorbid pt/family roles/activities: Pt overall independent, working, driving, caring for 16 year old adopted grandson Pt  transferrsed from [employer] Anticipated changes in roles/activities/participation: Family will need to asssit pt with task and roles Pt/family expectations/goals: [place]: None Premorbid Home Care/DME Agencies: None Transportation available at discharge: Family able to transport Resource referrals recommended: Neuropsychology (Coping. Pt dtr died 2020/06/05 from overdose. Other past family deaths in past 6 months)  Discharge Planning Living Arrangements: Spouse/significant other Support Systems: Spouse/significant other Type of Residence: Private residence Insurance Resources: Multimedia programmer (specify) Sports administrator) Financial Resources: Employment Financial Screen Referred: No Living Expenses: Rent Money Management: Patient,Spouse Does the patient have any problems obtaining your medications?: No Home Management: Previosuly indepedent Patient/Family Preliminary Plans: Family able to assist with money and medication manegement Care Coordinator Barriers to Discharge: [place] Coordinator Anticipated Follow Up Needs: HH/OP Expected length of stay: 2-3 Weeks  Clinical Impression Sw met with patient, introduced self and explained role. Addressed questions and concerns. Pt states currently she feels overwhelmed . Goal is for patient to get stronger to return home. Pt lives in a one level home. Pt spouse and sisters will provide 24/7 supervision a home for pt. Pt has 3 living children that live near her. Children are assisting with 40 year old adopted grandson. Pt has a daughter that passed away 05-Jun-2020, pt still dealing with that emotionally, added pt to neuro psych list to be seen Friday. No additional questions or concerns, sw will cont to follow up.   [name] 11/25/2020, 12:33 PM

## 2020-11-25 NOTE — IPOC Note (Signed)
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)   Debility   Dysphagia   Depression     Functional Problem List: Nursing Endurance,Medication Management,Motor,Nutrition,Pain,Safety,Skin Integrity  PT Balance,Behavior,Motor,Nutrition,Edema,Perception,Safety,Sensory,Endurance,Skin Integrity,Pain  OT Balance,Motor,Pain,Safety,Sensory,Skin Integrity  SLP Cognition  TR         Basic ADL's: OT Grooming,Bathing,Dressing,Toileting     Advanced  ADL's: OT Simple Meal Preparation     Transfers: PT Bed to Chair,Car,Furniture,Floor,Bed Mobility  OT Toilet,Tub/Shower     Locomotion: PT Ambulation,Wheelchair Mobility,Stairs     Additional Impairments: OT Fuctional Use of Upper Extremity (per MD, no weightbearing through distal digits with gangrenous changes)  SLP Communication,Social Cognition expression Problem Solving,Memory,Attention  TR      Anticipated Outcomes Item Anticipated Outcome  Self Feeding No goal  Swallowing      Basic self-care  Min A  Toileting  Min A   Bathroom Transfers Min A  Bowel/Bladder  pt will maintain bladder/bowel continence during hospital stay.  Transfers  Min assist transfers to and from [facility] with min assist  Locomotion  Supervision assist to modificed indep with Power WC .  Communication  Mod I  Cognition  Mod I  Pain  pt will have tolerable level of pain during hosptal stay  Safety/Judgment  pt will maintain safety/judgement awareness during hospital stay.   Therapy Plan: PT Intensity: Minimum of 1-2 x/day ,45 to 90 minutes PT Frequency: 5 out of 7 days PT Duration Estimated Length of Stay: 28-32 days OT Intensity: Minimum of 1-2 x/day, 45 to 90 minutes OT Frequency:  5 out of 7 days OT Duration/Estimated Length of Stay: 3-4 weeks SLP Intensity: Minumum of 1-2 x/day, 30 to 90 minutes SLP Frequency: 1 to 3 out of 7 days SLP Duration/Estimated Length of Stay: 3-4 weeks   Due to the current state of emergency, patients may not be receiving their 3-hours of Medicare-mandated therapy.   Team Interventions: Nursing Interventions Patient/Family Education,Skin Care/Wound Management,Pain Management,Psychosocial Support,Medication Management,Bowel Management  PT interventions Ambulation/gait training,DME/adaptive equipment instruction,Community reintegration,Neuromuscular re-education,Stair training,Wheelchair propulsion/positioning,UE/LE Strength taining/ROM,Psychosocial support,Discharge planning,Balance/vestibular training,Functional electrical stimulation,Pain management,Skin care/wound management,Cognitive remediation/compensation,Disease management/prevention,Functional mobility training,Patient/family education,Splinting/orthotics,Therapeutic Exercise,Visual/perceptual remediation/compensation,UE/LE Coordination activities,Therapeutic Activities  OT Interventions Balance/vestibular training,DME/adaptive equipment instruction,Patient/family education,Therapeutic Activities,Wheelchair propulsion/positioning,Therapeutic Exercise,Psychosocial support,Community reintegration,Functional mobility training,Self Care/advanced ADL retraining,UE/LE Strength taining/ROM,UE/LE Coordination activities,Skin care/wound managment,Discharge planning,Disease mangement/prevention,Pain management,Splinting/orthotics  SLP Interventions Cognitive remediation/compensation,Cueing hierarchy,Internal/external aids,Functional tasks,Medication managment,Patient/family education,Speech/Language facilitation  TR Interventions    SW/CM Interventions Discharge Planning,Psychosocial Support,Patient/Family Education,Disease Management/Prevention   Barriers to Discharge MD  Medical stability, Home  enviroment access/loayout, Wound care, Lack of/limited family support, Weight bearing restrictions, Medication compliance, Behavior and depression as well as gangrene of fingers and feet B/L  Nursing Decreased caregiver support,Wound Care,IV antibiotics,Insurance for SNF coverage one level home with spouse; caregiver for grand-child  PT Inaccessible home environment,Decreased caregiver support,Home environment access/layout,Insurance for SNF coverage,Weight bearing restrictions,Pending surgery,Pending chemo/radiation,Wound Care,Incontinence    OT Wound Care,Lack of/limited family support,Weight bearing restrictions,Behavior    SLP      SW Wound Care     Team Discharge Planning: Destination: PT-Home ,OT- Home , SLP-Home Projected Follow-up: PT-Home health PT, OT-  24 hour supervision/assistance, SLP-None (TBD) Projected Equipment Needs: PT-Wheelchair cushion (measurements),Wheelchair (measurements),To be determined, OT- To be determined, SLP-None recommended by SLP Equipment Details: PT-power  WC, SB., OT-  Patient/family involved in discharge planning: PT- Patient,Family member/caregiver,  OT-Patient, SLP-Patient,Family member/caregiver  MD ELOS: 3-4 weeks Medical Rehab Prognosis:  Good Assessment: Pt is a 60 yr old female with ICU myopathy as well as gangrene of fingers and feet/lower legs B/L- with pAfib, PE on eliquis, and worsening depressions (lost daughter to overdose <6 months ago)- also has dysphagia goals - min A overall except swallowing-    See Team Conference Notes for weekly updates to the plan of care

## 2020-11-26 DIAGNOSIS — R5381 Other malaise: Secondary | ICD-10-CM | POA: Diagnosis not present

## 2020-11-26 MED ORDER — HYDROCERIN EX CREA
TOPICAL_CREAM | Freq: Two times a day (BID) | CUTANEOUS | Status: DC
  Administered 2020-12-01 – 2020-12-22 (×5): 1 via TOPICAL
  Filled 2020-11-26 (×2): qty 113

## 2020-11-26 MED ORDER — SILVER SULFADIAZINE 1 % EX CREA
TOPICAL_CREAM | Freq: Every day | CUTANEOUS | Status: DC
  Filled 2020-11-26 (×2): qty 85

## 2020-11-26 NOTE — Progress Notes (Signed)
Physical Therapy Session Note  Patient Details  Name: [name] [name] MRN: [id number] Date of Birth: [birth year]  Today's Date: 11/26/2020 PT Individual Time: 1300-1400 PT Individual Time Calculation (min): 60 min   Short Term Goals: Week 1:  PT Short Term Goal 1 (Week 1): Pt will perform bed mobility with mod assist PT Short Term Goal 2 (Week 1): Pt will perform SB transfer with max A +2 PT Short Term Goal 3 (Week 1): Pt will tolerate sitting in WC >2 hours between therapies PT Short Term Goal 4 (Week 1): Pt will perform sit>stand with +2 assist Week 2:     Skilled Therapeutic Interventions/Progress Updates:    pt received in bed and agreeable to therapy. Pt directed in multiple rolling R and L with use of bedrails mod A- min A throughout session with VC for technique. Pt directed in supine>sit EOB mod A x1 and sat EOB SBA for 30 mins. Pt directed in seated BLE strengthening exercises of marching, LAQ 2x10 BUE exercises of chest press, bicep curls, wide clap, 30s air arm bike 2x10 each. Pt then requested to be placed on bed pan for BM min A-mod A to complete (+) bladder and bowel void. Dependent for pericare. Pt left in supine, All needs in reach and in good condition. Call light in hand.  And small wedge pillow placed at foot board to limit ankle contractures and/or poor resting position of BLE.   Therapy Documentation Precautions:  Precautions Precautions: Fall Precaution Comments: Gangrenous changes in hands Rt>Lt, open foot wounds bilaterally Restrictions Weight Bearing Restrictions: Yes RLE Weight Bearing: Weight bearing as tolerated LLE Weight Bearing: Weight bearing as tolerated Other Position/Activity Restrictions: WBAT B LEs while wearing compression socks + Darco shoes General:   Vital Signs: Therapy Vitals Temp: 97.7 F (36.5 C) Temp Source: Oral Pulse Rate: 88 Resp: 16 BP: 112/69 Patient Position (if appropriate): Lying Oxygen Therapy SpO2: 98 % O2 Device: Room  Air Pain:   Mobility:   Locomotion :    Trunk/Postural Assessment :    Balance:   Exercises:   Other Treatments:      Therapy/Group: Individual Therapy  Barbaraann Faster 11/26/2020, 2:20 PM

## 2020-11-26 NOTE — Progress Notes (Signed)
Occupational Therapy Session Note  Patient Details  Name: [name] [name] MRN: [id number] Date of Birth: Jun 03, 1961  Today's Date: 11/26/2020 OT Individual Time: 8527-7824 and 2353-6144 OT Individual Time Calculation (min): 45 min  And 40 min and Today's Date: 11/26/2020 OT Missed Time: 15 Minutes Missed Time Reason: Nursing care   Short Term Goals: Week 1:  OT Short Term Goal 1 (Week 1): Pt will sit EOB for ~5 minutes while engaged in an ADL task to increase upright tolerance OT Short Term Goal 2 (Week 1): Pt will complete LB dressing at sit<stand level with LRAD OT Short Term Goal 3 (Week 1): Pt don an overhead shirt with no more than Mod A using adaptive strategies as needed   Skilled Therapeutic Interventions/Progress Updates:    Session 1: Pt greeted at time of session supine in bed resting agreeable to OT session with pain with mobility but no # given, rest breaks and repositioning PRN. Focus of session on stretching tight heel cords within her tolerance, heel pumps to improve dorsiflexion, positioning in bed, and bed mobility. Supine > sit Min/Mod A for trunk elevation and when sitting EOB approx 2-3 minutes noted blood draining from RLE, RN notified and present for dressing change. Pt had questions regarding blood flow which were answered. Focus of remaining session on core strength for reaching tasks and modified sit ups/into long sitting for core strengthening. Missed 15 minutes of OT for dressing change.   Session 2: Pt greeted at time of session supine in bed finishing med pass with RN. Declined ADL, sitting EOB, or any seated activity. Focus of session on decreasing heel cord tightness with passive stretch careful to avoid necrotic areas/tissue. Remainder of session on sensory stimulation to BLEs with rough towel for texture and pt noted to have guarding with touch d/t prolonged hospitalization. Pt having several questions regarding medical status and gangrenous digits, recommended  speaking with MD tomorrow and helped write a list of questions for visual cues for tomorrow morning. Pt reclined in bed all needs met call bell in reach.    Therapy Documentation Precautions:  Precautions Precautions: Fall Precaution Comments: Gangrenous changes in hands Rt>Lt, open foot wounds bilaterally Restrictions Weight Bearing Restrictions: Yes RLE Weight Bearing: Weight bearing as tolerated LLE Weight Bearing: Weight bearing as tolerated Other Position/Activity Restrictions: WBAT B LEs while wearing compression socks + Darco shoes    Therapy/Group: Individual Therapy  [name] 11/26/2020, 7:17 AM

## 2020-11-26 NOTE — Progress Notes (Signed)
Occupational Therapy Session Note  Patient Details  Name: [name] [name] MRN: [id number] Date of Birth: 1961/03/17  Today's Date: 11/26/2020 OT Individual Time: 0930-1001 OT Individual Time Calculation (min): 31 min    Short Term Goals: Week 1:  OT Short Term Goal 1 (Week 1): Pt will sit EOB for ~5 minutes while engaged in an ADL task to increase upright tolerance OT Short Term Goal 2 (Week 1): Pt will complete LB dressing at sit<stand level with LRAD OT Short Term Goal 3 (Week 1): Pt don an overhead shirt with no more than Mod A using adaptive strategies as needed  Skilled Therapeutic Interventions/Progress Updates:    Pt received in room in bed and consented to OT tx. Pt declined sitting EOB this session 2/2 B feet pain when moving, agreeable to in- bed BUE exercises while in semifowler position in bed. Instructed in green hand gripper exercises including hand squeezes for 3x10 (pt much weaker in R hand) held onto green hand gripper for chest press, shoulder flexion, all for 3x10 with min cuing for proper tech with good carryover. Educated pt on proper core muscle engagement during exercises in prep for EOB activities for postural control and to increase unsupported sitting tolerance. After tx, pt left in bed with alarm on and all needs met.   Therapy Documentation Precautions:  Precautions Precautions: Fall Precaution Comments: Gangrenous changes in hands Rt>Lt, open foot wounds bilaterally Restrictions Weight Bearing Restrictions: Yes RLE Weight Bearing: Weight bearing as tolerated LLE Weight Bearing: Weight bearing as tolerated Other Position/Activity Restrictions: WBAT B LEs while wearing compression socks + Darco shoes    Pain: none   Therapy/Group: Individual Therapy  Ruthvik Barnaby 11/26/2020, 9:47 AM

## 2020-11-26 NOTE — Progress Notes (Signed)
PROGRESS NOTE   Subjective/Complaints:  Pt reports refusing to use black socks.  Has DF issues.  Worried about gabapentin-  Also concerned getting contractures of fingers.   ROS-  Pt denies SOB, abd pain, CP, N/V/C/D, and vision changes   Objective:   No results found. Recent Labs    11/25/20 1027  WBC 9.1  HGB 11.3*  HCT 36.4  PLT 400   Recent Labs    11/25/20 1027  NA 137  K 4.0  CL 101  CO2 26  GLUCOSE 100*  BUN <5*  CREATININE 0.44  CALCIUM 9.3    Intake/Output Summary (Last 24 hours) at 11/26/2020 1045 Last data filed at 11/26/2020 0911 Gross per 24 hour  Intake 420 ml  Output 1050 ml  Net -630 ml        Physical Exam: Vital Signs Blood pressure 128/82, pulse 97, temperature 98.6 F (37 C), temperature source Oral, resp. rate 16, height 5\' 4"  (1.626 m), weight 81.6 kg, last menstrual period 11/22/2020, SpO2 100 %.      General: awake, alert, appropriate, sitting up in bed;  NAD HENT: conjugate gaze; oropharynx moist CV: regular rate; no JVD Pulmonary: CTA B/L; no W/R/R- good air movement GI: soft, NT, ND, (+)BS Psychiatric: appropriate Neurological: Ox3 0/5 DF on R: 2-/5 on L- losing ROM of B/L ankles- has no more than 45 degrees on R and 50-60 degrees on L without pain Extremities: pt has dry gangrene on tips of fingers and feet B/L- as well as lower legs- to mid shins/calves B/L- skin open with big black areas and also bleeding onto bedding Skin: dry gangrene on finger tips an dtips of toes, Right heel eschar  Neurologic: Cranial nerves II through XII intact, motor strength is 4/5 in bilateral deltoid, bicep, tricep, grip, 3-hip flexor, 4- knee extensors, 0/5 on R: 2-/5  Musculoskeletal: losing ROM of DIPs esp No joint swelling  Assessment/Plan: 1. Functional deficits which require 3+ hours per day of interdisciplinary therapy in a comprehensive inpatient rehab  setting.  Physiatrist is providing close team supervision and 24 hour management of active medical problems listed below.  Physiatrist and rehab team continue to assess barriers to discharge/monitor patient progress toward functional and medical goals  Care Tool:  Bathing    Body parts bathed by patient: Right arm,Left arm,Chest,Abdomen,Front perineal area,Face   Body parts bathed by helper: Buttocks,Right upper leg,Left upper leg,Right lower leg,Left lower leg     Bathing assist Assist Level: 2 Helpers     Upper Body Dressing/Undressing Upper body dressing   What is the patient wearing?: Pull over shirt    Upper body assist Assist Level: Moderate Assistance - Patient 50 - 74%    Lower Body Dressing/Undressing Lower body dressing      What is the patient wearing?: Pants     Lower body assist Assist for lower body dressing: Maximal Assistance - Patient 25 - 49%     Toileting Toileting    Toileting assist Assist for toileting: Dependent - Patient 0%     Transfers Chair/bed transfer  Transfers assist     Chair/bed transfer assist level: Dependent - mechanical lift  Locomotion Ambulation   Ambulation assist   Ambulation activity did not occur: Safety/medical concerns          Walk 10 feet activity   Assist  Walk 10 feet activity did not occur: Safety/medical concerns        Walk 50 feet activity   Assist Walk 50 feet with 2 turns activity did not occur: Safety/medical concerns         Walk 150 feet activity   Assist Walk 150 feet activity did not occur: Safety/medical concerns         Walk 10 feet on uneven surface  activity   Assist Walk 10 feet on uneven surfaces activity did not occur: Safety/medical concerns         Wheelchair     Assist Will patient use wheelchair at discharge?: Yes Type of Wheelchair: Power    Wheelchair assist level: Moderate Assistance - Patient 50 - 74% Max wheelchair distance: 150     Wheelchair 50 feet with 2 turns activity    Assist        Assist Level: Moderate Assistance - Patient 50 - 74%   Wheelchair 150 feet activity     Assist      Assist Level: Moderate Assistance - Patient 50 - 74%   Blood pressure 128/82, pulse 97, temperature 98.6 F (37 C), temperature source Oral, resp. rate 16, height 5\' 4"  (1.626 m), weight 81.6 kg, last menstrual period 11/22/2020, SpO2 100 %.   Medical Problem List and Plan: 1.  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 con't PT and OT as tolerated  -calling Dr Audrie Lia today- con't PT and OT- encouraged pt to take pain meds as needed 2.  PE/Antithrombotics: -DVT/anticoagulation:  Pharmaceutical: Other (comment)--Eliquis- discussed with Pt and son (at bedside )              -antiplatelet therapy: N/A 3. Pain Management: Oxycodone 5mg  q 6 h prn, also  Gabapentin 300mg  BID - increase to TID  5/16- increased gabapentin to 400 mg q8 hours so more frequent.  5/17- pt worried only getting gabapentin q8 hours- will increase as of tomorrow to QID 400 mg               Monitor with increased exertion 4. Mood: LCSW to follow for evaluation and support. Team to offer ego support.   5/16- will increase Lexapro to 20 mg daily             -antipsychotic agents: N/A 5. Neuropsych: This patient is capable of making decisions on her own behalf. 6. Skin/Wound Care:              Prevalon boots for use when in bed. Continue left hand splint.              --Darco shoe for weight bearing per PT input. No sig changes vs pictures taken 5/6 in ortho consult  5/16- losing ROM of ankles B/L- will see if able to put in PRAFOs or something at night? Asking Dr 6/17  7.  Fluids/Electrolytes/Nutrition: Monitor  I/Os  CMP ordered             Added protein  supplements to help promote wound healing.  8.  PAF: Monitor HR tid--continue Metoprolol and Cardizem for rate control.             Vitals:   11/25/20 2038 11/26/20 0520  BP: (!) 136/99 128/82  Pulse: 99 97  Resp: 18 16  Temp: 98.3 F (36.8 C) 98.6 F (37 C)  SpO2: 99% 100%   5/16- BP soft, but no orthostasis per nursing; con't regimen 9. Dysphagia: Continue aspiration precautions--No straws.               Advance diet as tolerated  5/16- SLP to address/evaluate- con't regimen 10. ABLA              CBC ordered  5/16- Hb 11.3- better- con't regimen 11. Forming contractures- DIPs B/L and ankles  5/17- will ask Dr Lajoyce Corners and order eucerin and ROM of fingers b/l per pt request- explained to her, need to do ankle ROM/esp DF since will lose ability to walk if we do not do it.    LOS: 4 days A FACE TO FACE EVALUATION WAS PERFORMED  Dwain Huhn 11/26/2020, 10:45 AM

## 2020-11-26 NOTE — Progress Notes (Signed)
Patient ID: [name] [name], female   DOB: 1960-08-15, 60 y.o.   MRN: [id number] Team Conference Report to Patient/Family  Team Conference discussion was reviewed with the patient and caregiver, including goals, any changes in plan of care and target discharge date.  Patient and caregiver express understanding and are in agreement.  The patient has a target discharge date of  (4 WEEKS).   SW met with patient, called husband at bedside. Provided conference updates. No questions or concerns at the moment. Pt aware of elevation recommendations and agrees to scheduling pain meds. Sw will cont to follow up.   [name] 11/26/2020, 1:41 PM

## 2020-11-27 DIAGNOSIS — R5381 Other malaise: Secondary | ICD-10-CM | POA: Diagnosis not present

## 2020-11-27 MED ORDER — GABAPENTIN 400 MG PO CAPS
400.0000 mg | ORAL_CAPSULE | Freq: Four times a day (QID) | ORAL | Status: DC
  Administered 2020-11-27 – 2020-12-03 (×24): 400 mg via ORAL
  Filled 2020-11-27 (×24): qty 1

## 2020-11-27 MED ORDER — LIDOCAINE 5 % EX OINT
TOPICAL_OINTMENT | Freq: Four times a day (QID) | CUTANEOUS | Status: DC | PRN
  Filled 2020-11-27: qty 35.44

## 2020-11-27 NOTE — Progress Notes (Signed)
Occupational Therapy Session Note  Patient Details  Name: [name] [name] MRN: [id number] Date of Birth: 1960-09-03  Today's Date: 11/27/2020 OT Individual Time: 1000-1115 OT Individual Time Calculation (min): 75 min    Short Term Goals: Week 1:  OT Short Term Goal 1 (Week 1): Pt will sit EOB for ~5 minutes while engaged in an ADL task to increase upright tolerance OT Short Term Goal 2 (Week 1): Pt will complete LB dressing at sit<stand level with LRAD OT Short Term Goal 3 (Week 1): Pt don an overhead shirt with no more than Mod A using adaptive strategies as needed   Skilled Therapeutic Interventions/Progress Updates:    Pt greeted at time of session supine in bed resting with husband [name] [name] present who remained throughout session. Focus of session on EOB ADL with therapist providing wash basin for pt to wash UB and thigh/slightly past knee with therapist assist for remaining LEs (excluding feet) and therapist assist to go back over areas for pt for thoroughness. UB dress EOB Mod A with techniques encouraging to use thumbs for looping and LB dressing bed level rolling L/R with 2 helpers. Mod A for rolling and Mod A as well for supine > sit and sit > supine Max. PROM for heel cord stretch as well provided to pt tolerance. Pt agreeable to sit up in chair until next PT session, maximove w/ +2 assist to manage LEs > power chair. Positioned with LEs elevated and reclined for comfort, reviewed how to use basic features as well. Pt up in power chair with seat belt on, husband present, all needs met, call bell in reach. Checked on pt approx 30 minutes later and still in good positioning/no complaints.   Therapy Documentation Precautions:  Precautions Precautions: Fall Precaution Comments: Gangrenous changes in hands Rt>Lt, open foot wounds bilaterally Restrictions Weight Bearing Restrictions: Yes RLE Weight Bearing: Weight bearing as tolerated LLE Weight Bearing: Weight bearing as tolerated Other  Position/Activity Restrictions: WBAT B LEs while wearing compression socks + Darco shoes     Therapy/Group: Individual Therapy  [name] 11/27/2020, 7:21 AM

## 2020-11-27 NOTE — Progress Notes (Signed)
Physical Therapy Session Note  Patient Details  Name: [name] [name] MRN: [id number] Date of Birth: [birth year]  Today's Date: 11/27/2020 PT Individual Time: 1300-1415 PT Individual Time Calculation (min): 75 min   Short Term Goals: Week 1:  PT Short Term Goal 1 (Week 1): Pt will perform bed mobility with mod assist PT Short Term Goal 2 (Week 1): Pt will perform SB transfer with max A +2 PT Short Term Goal 3 (Week 1): Pt will tolerate sitting in WC >2 hours between therapies PT Short Term Goal 4 (Week 1): Pt will perform sit>stand with +2 assist  Skilled Therapeutic Interventions/Progress Updates:    Pt received seated in PWC in room, agreeable to PT session. Pt reports urge to have a BM. Maxi move transfer PWC to bed. Rolling L/R with mod A for placement of bedpan. Pt able to continently void on bedpan, see Flowsheet for details. Rolling L/R for removal of bedpan, dependent pericare and donning of pants. Pt agreeable to get back up to [facility]. Maxi move transfer back to chair. PWC mobility x 300 ft at Supervision level with max cueing at times for safe driving and avoiding hitting LE on wall due to driving with BLE in extended position. Pt requests to return to bed at end of session. Maxi move transfer back to bed. Pt left seated in bed with needs in reach, husband present.  Therapy Documentation Precautions:  Precautions Precautions: Fall Precaution Comments: Gangrenous changes in hands Rt>Lt, open foot wounds bilaterally Restrictions Weight Bearing Restrictions: Yes RLE Weight Bearing: Weight bearing as tolerated LLE Weight Bearing: Weight bearing as tolerated Other Position/Activity Restrictions: WBAT B LEs while wearing compression socks + Darco shoes   Therapy/Group: Individual Therapy   Peter Congo, PT, DPT, CSRS  11/27/2020, 3:26 PM

## 2020-11-27 NOTE — Progress Notes (Signed)
Speech Language Pathology Daily Session Note  Patient Details  Name: Varnika Butz MRN: [id number] Date of Birth: April 26, 1961  Today's Date: 11/27/2020 SLP Individual Time: 5643-3295 SLP Individual Time Calculation (min): 41 min  Short Term Goals: Week 1: SLP Short Term Goal 1 (Week 1): Pt will demonstrate complex problem solving in functional task (ex: medication/money/time management) with supervison A verbal cues. SLP Short Term Goal 2 (Week 1): Pt will demonstrate alternating attention during complex tasks in 10 minute intervals with supervision A verbal cues. SLP Short Term Goal 3 (Week 1): Pt will demonstrate recall of novel, daily information with supervision A verbal cues. SLP Short Term Goal 4 (Week 1): Pt will participate in higher level word finding task (ex: divergent and convergent naming) with supervision A semantic cues.  Skilled Therapeutic Interventions:Skilled ST services focused on cognitive skills. SLP facilitated complex problem solving skills in account balancing task, pt required max A verbal cues for organization/recall within task.  When task was broken down to step by step, pt required mod A fade to min A verbal cues when dictating for SLP to record due to poor legibility.  Pt was left in room with call bell within reach and bed alarm set. SLP recommends to continue skilled services.     Pain Pain Assessment Pain Score: Asleep  Therapy/Group: Individual Therapy  Kingsley Farace  [facility] 11/27/2020, 7:59 AM

## 2020-11-27 NOTE — Patient Care Conference (Signed)
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 Genice Rouge Care Coodinator Present: Kennyth Arnold, RN, BSN, CRRN;Christina Lewis, BSW Nurse Present: Kennyth Arnold, RN PT Present: Otelia Sergeant, PT OT Present: Earleen Newport, OT SLP Present: Colin Benton, SLP PPS Coordinator present : Fae Pippin, SLP     Current Status/Progress Goal Weekly Team Focus  Bowel/Bladder   Continent x2  Patient will remain continent x2  Will assess qshift and PRN   Swallow/Nutrition/ Hydration             ADL's   very limited participation so far, Max LB dress, 2 helpers bathe, Mod UB dress, total A footwear, dependent toileting, limited by pain in BLEs  Min A LB/toileting, Min transfers  sitting balance/core strength, general conditioning, ADL retraining, progressing to EOB activity, pain management   Mobility   max A bed mobility; dependent transfers; poor tolerance in PWC  CGA bed mob; min A-mod A transfers; supervision WC  pain management; bed mob; transfers; WC mobility   Communication   Supervision, word finding in conversation/higher level  Mod I  word finding startegies, higher leverl tasks   Safety/Cognition/ Behavioral Observations  Min-Supervision  Mod I  Alertnating  attention, complex problem solving, and recall   Pain   Pain controlled with current regimen  Pain will remain less than 3  Will assess qshift and PRN   Skin   Patient has necrotic fingers, toes, and feet  Skin will continue to heal and remain infection free  Will assess qshift and PRN     Discharge Planning:      Team Discussion: Refusing to wear black socks, reached out to Dr. Lajoyce Corners for advice. Increased Gabapentin. Continent B/B, has complaints of pain, treating with Oxy q 4hr. Has necrotic fingers and toes. Discharging home with husband and sisters plan to help. Patient on target to meet rehab goals: yes, anxious at the beginning of sessions. Bed level treatments now. WBAT with darco shoes, max/total assist, sat EOB, bleeding at right foot. Nursing changed dressing. Lifted foot for nursing, patient can't tolerate lifting legs in dressing change. Can't tolerate feet being flat on the floor, too painful. SLP working on [employer], attention, and recall. Mod I goals.  *See Care Plan and progress notes for long and short-term goals.   Revisions to Treatment Plan:  Increased Gabapentin 400 mg q 6 hr.   Teaching Needs: Family education, medication management, pain management, skin/wound care , dressing changes, transfer training, gait training, balance training, endurance training, safety awareness.  Current Barriers to Discharge: Decreased caregiver support, Medical stability, Home enviroment access/layout, Wound care, Lack of/limited family support, Weight, Weight bearing restrictions, Medication compliance, Behavior and Nutritional means  Possible Resolutions to Barriers: Continue current medications,  provide emotional support.     Medical Summary Current Status: PE/forming hand/feet/ankle contractures; continent B/B- oxy and gabapentin for pain; necrotic toes/fingers/feet/ankles-  Barriers to Discharge: Decreased family/caregiver support;Home enviroment access/layout;Medical  stability;Nutrition means;Wound care;Weight bearing restrictions;Weight  Barriers to Discharge Comments: d/c home wiht husband/sisters for 24/7- limited by anxiety/pain/ decreased ROM of hands/fingers/feet/toes- Darco shoes; motivated Possible Resolutions to [employer]: not OOB yet; increasing gabapentin to 400 q6hours; encouraged to take oxy (daughter died of overdose 2023/06/24); in w/c 1 hr; use wedge at footboard; calling Dr Lajoyce Corners about black socks- cannot wear; SLP cognition-3x/week- focus on pain/ROM  d/c- 4 weeks   Continued Need for Acute Rehabilitation Level of Care: The patient requires daily medical management by a physician with specialized training in physical medicine and rehabilitation for the following reasons: Direction of a multidisciplinary physical rehabilitation program to maximize functional independence : Yes Medical management of patient stability for increased activity during participation in an intensive rehabilitation regime.: Yes Analysis of laboratory values and/or radiology reports with any subsequent need for medication adjustment and/or medical intervention. : Yes   I attest that I was present, lead the team conference, and concur with the assessment and plan of the team.   Tennis Must 11/27/2020, 4:20 PM

## 2020-11-27 NOTE — Progress Notes (Signed)
PROGRESS NOTE   Subjective/Complaints:  Pt reports black socks hurt so bad, she is just not able to wear them- checked with Dr Audrie Lia PA who suggested silvadene and wound care changes daily- but they are concerned she will lose her feet/toes. Also forming contractures of ankles, which concerns me.  Does have wedge at end of her bed, but R foot had fallen out of bed- and pt couldn't put back in on her own.  Hands very sensitive/painful/tender due to "new skin".  Will also increase gabapentin to 400 mg QID today.  We discussed and will try lidocaine ointment q4 hours prn-max 4x/day for hand pain.     Pt denies SOB, abd pain, CP, N/V/C/D, and vision changes  Objective:   No results found. Recent Labs    11/25/20 1027  WBC 9.1  HGB 11.3*  HCT 36.4  PLT 400   Recent Labs    11/25/20 1027  NA 137  K 4.0  CL 101  CO2 26  GLUCOSE 100*  BUN <5*  CREATININE 0.44  CALCIUM 9.3    Intake/Output Summary (Last 24 hours) at 11/27/2020 1653 Last data filed at 11/27/2020 1300 Gross per 24 hour  Intake 600 ml  Output 1900 ml  Net -1300 ml        Physical Exam: Vital Signs Blood pressure (!) 106/51, pulse 78, temperature 98.4 F (36.9 C), temperature source Oral, resp. rate 16, height 5\' 4"  (1.626 m), weight 83.9 kg, last menstrual period 11/22/2020, SpO2 99 %.       General: awake, alert, appropriate, sitting up in bed; NAD HENT: conjugate gaze; oropharynx moist CV: regular rate; no JVD Pulmonary: CTA B/L; no W/R/R- good air movement GI: soft, NT, ND, (+)BS Psychiatric: appropriate; slightly frustrated about pain/Sx's Neurological: alert- N/T complaints in her hands/feet  0/5 DF on R: 2-/5 on L- losing ROM of B/L ankles- has no more than 45 degrees on R and 50-60 degrees on L without pain- no significant change today, but R foot hanging off bed/off wedge that was propping ankles.  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   Assessment/Plan: 1. Functional deficits which require 3+ hours per day of interdisciplinary therapy in a comprehensive inpatient rehab setting.  Physiatrist is providing close team supervision and 24 hour management of active medical problems listed below.  Physiatrist and rehab team continue to assess barriers to discharge/monitor patient progress toward functional and medical goals  Care Tool:  Bathing    Body parts bathed by patient: Right arm,Left arm,Chest,Abdomen,Face   Body parts bathed by helper: Right lower leg,Left lower leg,Left upper leg,Right upper leg     Bathing assist Assist Level: Moderate Assistance - Patient 50 - 74% (declined LB  bathing buttocks/periarea)     Upper Body Dressing/Undressing Upper body dressing   What is the patient wearing?: Pull over shirt    Upper body assist Assist Level: Moderate Assistance - Patient 50 - 74%    Lower Body Dressing/Undressing Lower body dressing      What is the patient wearing?: Pants     Lower body assist Assist for lower body dressing: 2 Helpers     Toileting Toileting    Toileting assist Assist for toileting: Dependent - Patient 0%     Transfers Chair/bed transfer  Transfers assist     Chair/bed transfer assist level: Dependent - mechanical lift     Locomotion Ambulation   Ambulation assist   Ambulation activity did not occur: Safety/medical concerns          Walk 10 feet activity   Assist  Walk 10 feet activity did not occur:  Safety/medical concerns        Walk 50 feet activity   Assist Walk 50 feet with 2 turns activity did not occur: Safety/medical concerns         Walk 150 feet activity   Assist Walk 150 feet activity did not occur: Safety/medical concerns         Walk 10 feet on uneven surface  activity   Assist Walk 10 feet on uneven surfaces activity did not occur: Safety/medical concerns         Wheelchair     Assist Will patient use wheelchair at discharge?: Yes Type of Wheelchair: Power    Wheelchair assist level: Supervision/Verbal cueing Max wheelchair distance: 300'    Wheelchair 50 feet with 2 turns activity    Assist        Assist Level: Supervision/Verbal cueing   Wheelchair 150 feet activity     Assist      Assist Level: Supervision/Verbal cueing   Blood pressure (!) 106/51, pulse 78, temperature 98.4 F (36.9 C), temperature source Oral, resp. rate 16, height 5\' 4"  (1.626 m), weight 83.9 kg, last menstrual period 11/22/2020, SpO2 99 %.   Medical Problem List and Plan: 1.  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 con't PT and OT as tolerated  -calling Dr Audrie Lia today- con't PT and OT- encouraged pt to take pain meds as needed  -con't PT and OT- will reorder PRAFOs- don't see in room and looked for them- then ordered at night- OK'd by Dr Audrie Lia PA 2.  PE/Antithrombotics: -DVT/anticoagulation:  Pharmaceutical: Other (comment)--Eliquis- discussed with Pt and son (at bedside )              -antiplatelet therapy: N/A 3. Pain Management: Oxycodone 5mg  q 6 h prn, also  Gabapentin 300mg  BID - increase to TID  5/18- increased gabapentin to 400 mg QID today and added Lidocaine ointment q4 hours  prn max 4x/day             Monitor with increased exertion 4. Mood: LCSW to follow for evaluation and support. Team to offer ego support.   5/16- will increase Lexapro to 20 mg daily             -  antipsychotic agents: N/A 5. Neuropsych: This patient is capable of making decisions on her own behalf. 6. Skin/Wound Care:              Prevalon boots for use when in bed. Continue left hand splint.              --Darco shoe for weight bearing per PT input. No sig changes vs pictures taken 5/6 in ortho consult  5/16- losing ROM of ankles B/L- will see if able to put in PRAFOs or something at night? Asking Dr Lajoyce Corners   5/18- approved to do silvadene and wound care for feet as well as PRAFOs for ankles- to wear at night.  7. Fluids/Electrolytes/Nutrition: Monitor  I/Os             CMP ordered             Added protein  supplements to help promote wound healing.  8.  PAF: Monitor HR tid--continue Metoprolol and Cardizem for rate control.             Vitals:   11/27/20 0346 11/27/20 1458  BP: 119/70 (!) 106/51  Pulse: 77 78  Resp: 18 16  Temp: 98.4 F (36.9 C)   SpO2: 95% 99%   5/18- BP still borderline soft, but no complaints of orthostatic hypotension?- con't regimen 9. Dysphagia: Continue aspiration precautions--No straws.               Advance diet as tolerated  5/16- SLP to address/evaluate- con't regimen 10. ABLA              CBC ordered  5/16- Hb 11.3- better- con't regimen 11. Forming contractures- DIPs B/L and ankles  5/17- will ask Dr Lajoyce Corners and order eucerin and ROM of fingers b/l per pt request- explained to her, need to do ankle ROM/esp DF since will lose ability to walk if we do not do it.    5/18- starting PRAFOs at night/and wedge at end of bed to prevent worsening contractures  LOS: 5 days A FACE TO FACE EVALUATION WAS PERFORMED  Lyndol Vanderheiden 11/27/2020, 4:53 PM

## 2020-11-28 DIAGNOSIS — R5381 Other malaise: Secondary | ICD-10-CM | POA: Diagnosis not present

## 2020-11-28 NOTE — Progress Notes (Signed)
Physical Therapy Session Note  Patient Details  Name: [name] [name] MRN: [id number] Date of Birth: [birth year]  Today's Date: 11/28/2020 PT Individual Time: 0900-1000 PT Individual Time Calculation (min): 60 min   Short Term Goals: Week 1:  PT Short Term Goal 1 (Week 1): Pt will perform bed mobility with mod assist PT Short Term Goal 2 (Week 1): Pt will perform SB transfer with max A +2 PT Short Term Goal 3 (Week 1): Pt will tolerate sitting in WC >2 hours between therapies PT Short Term Goal 4 (Week 1): Pt will perform sit>stand with +2 assist  Skilled Therapeutic Interventions/Progress Updates:    pt received in bed and agreeable to therapy. Pt directed in static stretching of B ankle DF 5x30s each with gentle stretching provided by PT at pt's comfort level no increased pain reported. Pt directed in donning shorts total A at bed level. Pt directed in rolling R and L min A x3 each for shorts and lift pad placement. Pt required use of lift to transfer to [facility] as she denied use of slide board at this time 2/2 not feeling comfortable per pt at using it. Pt then directed in PWC mobility with emphasis being on navigation, safety with turns, changes of direction and mobilizing in smaller areas, min A for turns in large areas and total A for alignment in room. Pt left in room, in PWC,  All needs in reach and in good condition. Call light in hand.  Nursing aware pt in PWC.   Therapy Documentation Precautions:  Precautions Precautions: Fall Precaution Comments: Gangrenous changes in hands Rt>Lt, open foot wounds bilaterally Restrictions Weight Bearing Restrictions: Yes RLE Weight Bearing: Weight bearing as tolerated LLE Weight Bearing: Weight bearing as tolerated Other Position/Activity Restrictions: WBAT B LEs while wearing compression socks + Darco shoes General:   Vital Signs: Therapy Vitals Temp: 98.2 F (36.8 C) Temp Source: Oral Pulse Rate: 78 Resp: 18 BP: 107/64 Patient Position  (if appropriate): Sitting Oxygen Therapy SpO2: 100 % O2 Device: Room Air Pain: Pain Assessment Pain Score: 0-No pain Faces Pain Scale: No hurt PAINAD (Pain Assessment in Advanced Dementia) Breathing: normal Negative Vocalization: none Mobility:   Locomotion :    Trunk/Postural Assessment :    Balance:   Exercises: General Exercises - Upper Extremity Shoulder Flexion: AROM;Both;10 reps;Seated;Bar weights/barbell Bar Weights/Barbell (Shoulder Flexion): 2 lbs Shoulder Extension: AROM Shoulder ABduction: AROM Shoulder ADduction: AROM Shoulder Horizontal ABduction: AROM Shoulder Horizontal ADduction: AROM Elbow Flexion: AROM;Strengthening;Both;10 reps;Seated;Bar weights/barbell Bar Weights/Barbell (Elbow Flexion): 2 lbs Elbow Extension: AROM Other Treatments:      Therapy/Group: Individual Therapy  Barbaraann Faster 11/28/2020, 3:12 PM

## 2020-11-28 NOTE — Progress Notes (Signed)
Orthopedic Tech Progress Note Patient Details:  [name] [name] 11-Dec-1960 [id number] Ordered bilateral [facility] Patient ID: [name] [name], female   DOB: 1961-03-28, 60 y.o.   MRN: [id number]   Gerald Stabs 11/28/2020, 10:21 AM

## 2020-11-28 NOTE — Progress Notes (Signed)
Occupational Therapy Session Note  Patient Details  Name: [name] [name] MRN: [id number] Date of Birth: [birth year]  Today's Date: 11/28/2020 OT Individual Time: 1345-1445 OT Individual Time Calculation (min): 60 min    Short Term Goals: Week 1:  OT Short Term Goal 1 (Week 1): Pt will sit EOB for ~5 minutes while engaged in an ADL task to increase upright tolerance OT Short Term Goal 2 (Week 1): Pt will complete LB dressing at sit<stand level with LRAD OT Short Term Goal 3 (Week 1): Pt don an overhead shirt with no more than Mod A using adaptive strategies as needed  Skilled Therapeutic Interventions/Progress Updates:    1:1. Pt received in [place] agreeable ot session with unrated pain in B feet. Rest and repositioning provided. Pt completes [place] steering to/from outside courtyard with feet covered/in shade with VC for coaching through backing up into/out of elevators. Pt not confidnet with steering and encouragement to not give up provided. Pt completes UB conditioning/therex with beach ball seated in [place] outside and pt naming wildlife smiling enjoying the sunshine. MAXI transfer back to bed and placed pt on bed pan with call light in reach and all needs met. Exited session with pt seated in bed, exit alarm on and call light in reach   Therapy Documentation Precautions:  Precautions Precautions: Fall Precaution Comments: Gangrenous changes in hands Rt>Lt, open foot wounds bilaterally Restrictions Weight Bearing Restrictions: Yes RLE Weight Bearing: Weight bearing as tolerated LLE Weight Bearing: Weight bearing as tolerated Other Position/Activity Restrictions: WBAT B LEs while wearing compression socks + Darco shoes General:   Vital Signs:   Pain: Pain Assessment Pain Score: 4  Faces Pain Scale: Hurts a little bit PAINAD (Pain Assessment in Advanced Dementia) Breathing: normal Negative Vocalization: none ADL: ADL Eating: Not assessed Grooming: Moderate assistance Where  Assessed-Grooming: Bed level Upper Body Bathing: Supervision/safety Where Assessed-Upper Body Bathing: Bed level Lower Body Bathing: Other (comment) (+2 assist) Where Assessed-Lower Body Bathing: Bed level Upper Body Dressing: Moderate assistance Where Assessed-Upper Body Dressing: Bed level Lower Body Dressing: Other (Comment) (+2 assist) Where Assessed-Lower Body Dressing: Bed level Toileting: Other (Comment) (+2 assist) Where Assessed-Toileting: Bed level Toilet Transfer: Not assessed Tub/Shower Transfer: Not assessed ADL Comments: ADL assessment very limited due to heightened pain Vision   Perception    Praxis   Exercises:   Other Treatments:     Therapy/Group: Individual Therapy  Tonny Branch 11/28/2020, 12:39 PM

## 2020-11-28 NOTE — Progress Notes (Signed)
Occupational Therapy Session Note  Patient Details  Name: [name] [name] MRN: [id number] Date of Birth: 1961-03-25  Today's Date: 11/28/2020 OT Individual Time: 1100-1155 OT Individual Time Calculation (min): 55 min    Short Term Goals: Week 1:  OT Short Term Goal 1 (Week 1): Pt will sit EOB for ~5 minutes while engaged in an ADL task to increase upright tolerance OT Short Term Goal 2 (Week 1): Pt will complete LB dressing at sit<stand level with LRAD OT Short Term Goal 3 (Week 1): Pt don an overhead shirt with no more than Mod A using adaptive strategies as needed  Skilled Therapeutic Interventions/Progress Updates:    Pt resting in PWC upon arrival. Pt agreeable to remaining in PWC. OT intervention with focus on BUE AROM and strengthening, discharge planning, and activity tolerance. Home measurement sheet provided for pt's husband to complete. Pt engaged in seated BUE AROM and strengthening tasks. See below. Pt completed 3 sets of each exercise-biceps curls, chest presses, overhead presses with 2# bar. Pt also engaged in AROM exercises to maintain joint mobility. Pt remained in PWC with all needs within reach.   Therapy Documentation Precautions:  Precautions Precautions: Fall Precaution Comments: Gangrenous changes in hands Rt>Lt, open foot wounds bilaterally Restrictions Weight Bearing Restrictions: Yes RLE Weight Bearing: Weight bearing as tolerated LLE Weight Bearing: Weight bearing as tolerated Other Position/Activity Restrictions: WBAT B LEs while wearing compression socks + Darco shoes Pain:  Pt c/o 4/10 Bil hand pain; RN Mikle Bosworth attended to pt and applied topical pain meds     Exercises: General Exercises - Upper Extremity Shoulder Flexion: AROM;Both;10 reps;Seated;Bar weights/barbell Bar Weights/Barbell (Shoulder Flexion): 2 lbs Shoulder Extension: AROM Shoulder ABduction: AROM Shoulder ADduction: AROM Shoulder Horizontal ABduction: AROM Shoulder Horizontal  ADduction: AROM Elbow Flexion: AROM;Strengthening;Both;10 reps;Seated;Bar weights/barbell Bar Weights/Barbell (Elbow Flexion): 2 lbs Elbow Extension: AROM  Therapy/Group: Individual Therapy  Rich Brave 11/28/2020, 2:12 PM

## 2020-11-28 NOTE — Progress Notes (Signed)
PROGRESS NOTE   Subjective/Complaints:  Pt said "no one putting eucerin cream on hands"- however they put on yesterday AM- and hands look more moisturized overall. So pt admits could be some emory/losing track of time issues.  Hasn't gotten PRAFOs yet.  Doing pretty well overall- saw Dr Shelva Majestic yesterday. No note in computer yet.  3 BMs yesterday- urgent- wants bowel meds decreased, which is good.    ROS:  Pt denies SOB, abd pain, CP, N/V/C/D, and vision changes  Objective:   No results found. Recent Labs    11/25/20 1027  WBC 9.1  HGB 11.3*  HCT 36.4  PLT 400   Recent Labs    11/25/20 1027  NA 137  K 4.0  CL 101  CO2 26  GLUCOSE 100*  BUN <5*  CREATININE 0.44  CALCIUM 9.3    Intake/Output Summary (Last 24 hours) at 11/28/2020 0815 Last data filed at 11/27/2020 1843 Gross per 24 hour  Intake 360 ml  Output 550 ml  Net -190 ml        Physical Exam: Vital Signs Blood pressure 110/65, pulse 78, temperature 98.1 F (36.7 C), temperature source Oral, resp. rate 18, height 5\' 4"  (1.626 m), weight 85.3 kg, last menstrual period 11/22/2020, SpO2 99 %.        General: awake, alert, appropriate, sitting up in bed; NAD HENT: conjugate gaze; oropharynx moist CV: regular rate; no JVD Pulmonary: CTA B/L; no W/R/R- good air movement GI: soft, NT, ND, (+)BS- slightly hyperactive Psychiatric: appropriate Neurological: alert- some STM issues? 0/5 DF on R: 2-/5 on L- losing ROM of B/L ankles- has no more than 45 degrees on R and 50-60 degrees on L without pain- no significant change today, but R foot hanging off bed/off wedge that was propping ankles.  Extremities: pt has dry gangrene on tips of fingers and feet B/L- as well as lower legs- to mid shins/calves B/L- skin open with big black areas - no significant change today Skin: dry gangrene on finger tips an dtips of toes, Right heel eschar  Neurologic:  Cranial nerves II through XII intact, motor strength is 4/5 in bilateral deltoid, bicep, tricep, grip, 3-hip flexor, 4- knee extensors, 0/5 on R: 2-/5  Musculoskeletal: no change in ROM of DIPs or ankles, but appears less bleeding on feet/ankles and more moisturized   Assessment/Plan: 1. Functional deficits which require 3+ hours per day of interdisciplinary therapy in a comprehensive inpatient rehab setting.  Physiatrist is providing close team supervision and 24 hour management of active medical problems listed below.  Physiatrist and rehab team continue to assess barriers to discharge/monitor patient progress toward functional and medical goals  Care Tool:  Bathing    Body parts bathed by patient: Right arm,Left arm,Chest,Abdomen,Face   Body parts bathed by helper: Right lower leg,Left lower leg,Left upper leg,Right upper leg     Bathing assist Assist Level: Moderate Assistance - Patient 50 - 74% (declined LB bathing buttocks/periarea)     Upper Body Dressing/Undressing Upper body dressing   What is the patient wearing?: Pull over shirt    Upper body assist Assist Level: Moderate Assistance - Patient 50 - 74%  Lower Body Dressing/Undressing Lower body dressing      What is the patient wearing?: Pants     Lower body assist Assist for lower body dressing: 2 Helpers     Toileting Toileting    Toileting assist Assist for toileting: Dependent - Patient 0%     Transfers Chair/bed transfer  Transfers assist     Chair/bed transfer assist level: Dependent - mechanical lift     Locomotion Ambulation   Ambulation assist   Ambulation activity did not occur: Safety/medical concerns          Walk 10 feet activity   Assist  Walk 10 feet activity did not occur: Safety/medical concerns        Walk 50 feet activity   Assist Walk 50 feet with 2 turns activity did not occur: Safety/medical concerns         Walk 150 feet activity   Assist Walk  150 feet activity did not occur: Safety/medical concerns         Walk 10 feet on uneven surface  activity   Assist Walk 10 feet on uneven surfaces activity did not occur: Safety/medical concerns         Wheelchair     Assist Will patient use wheelchair at discharge?: Yes Type of Wheelchair: Power    Wheelchair assist level: Supervision/Verbal cueing Max wheelchair distance: 300'    Wheelchair 50 feet with 2 turns activity    Assist        Assist Level: Supervision/Verbal cueing   Wheelchair 150 feet activity     Assist      Assist Level: Supervision/Verbal cueing   Blood pressure 110/65, pulse 78, temperature 98.1 F (36.7 C), temperature source Oral, resp. rate 18, height 5\' 4"  (1.626 m), weight 85.3 kg, last menstrual period 11/22/2020, SpO2 99 %.   Medical Problem List and Plan: 1.  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             -ELOS/Goals: 17-20 days/Min A             -con't PT and OT- PRAFOs ordered- don't have any from before? 2.  PE/Antithrombotics: -DVT/anticoagulation:  Pharmaceutical: Other (comment)--Eliquis- discussed with Pt and son (at bedside )              -antiplatelet therapy: N/A 3. Pain Management: Oxycodone 5mg  q 6 h prn, also  Gabapentin 300mg  BID - increase to TID  5/18- increased gabapentin to 400 mg QID today and added Lidocaine ointment q4 hours prn max 4x/day  5/19- pain doing better- con't meds/regimen- pt doesn't want to change timing of Lidocaine ointment from prn- just didn't ask yesterday- forgot             Monitor with increased exertion 4. Mood: LCSW to follow for evaluation and support. Team to offer ego support.   5/16- will increase Lexapro to 20 mg daily             -antipsychotic agents: N/A 5. Neuropsych: This patient is capable of making decisions on her own behalf. 6. Skin/Wound  Care:              Prevalon boots for use when in bed. Continue left hand splint.              --Darco shoe for weight bearing  per PT input. No sig changes vs pictures taken 5/6 in ortho consult  5/16- losing ROM of ankles B/L- will see if able to put in PRAFOs or something at night? Asking Dr Lajoyce Corners   5/18- approved to do silvadene and wound care for feet as well as PRAFOs for ankles- to wear at night.  7. Fluids/Electrolytes/Nutrition: Monitor  I/Os             CMP ordered             Added protein  supplements to help promote wound healing.  8.  PAF: Monitor HR tid--continue Metoprolol and Cardizem for rate control.             Vitals:   11/27/20 2000 11/28/20 0529  BP: (!) 105/52 110/65  Pulse: 85 78  Resp: 18 18  Temp: 98.3 F (36.8 C) 98.1 F (36.7 C)  SpO2: 100% 99%   5/18- BP still borderline soft, but no complaints of orthostatic hypotension?- con't regimen 9. Dysphagia: Continue aspiration precautions--No straws.               Advance diet as tolerated  5/16- SLP to address/evaluate- con't regimen 10. ABLA              CBC ordered  5/16- Hb 11.3- better- con't regimen 11. Forming contractures- DIPs B/L and ankles  5/17- will ask Dr Lajoyce Corners and order eucerin and ROM of fingers b/l per pt request- explained to her, need to do ankle ROM/esp DF since will lose ability to walk if we do not do it.    5/18- starting PRAFOs at night/and wedge at end of bed to prevent worsening contractures 12. Loose stools  5/19- went to reduce bowel meds- but only on prns- might be supplements? Nothing to reduce- if continues, will possibly use Imodium.   LOS: 6 days A FACE TO FACE EVALUATION WAS PERFORMED  [name] [name] 11/28/2020, 8:15 AM

## 2020-11-29 DIAGNOSIS — R5381 Other malaise: Secondary | ICD-10-CM | POA: Diagnosis not present

## 2020-11-29 NOTE — Progress Notes (Signed)
Physical Therapy Session Note  Patient Details  Name: [name] [name] MRN: [id number] Date of Birth: [birth year]  Today's Date: 11/29/2020 PT Individual Time: 1100-1000 and refused second session at 1100 2/2 "overwhelming nausea" PT Individual Time Calculation (min): 60 min   Short Term Goals: Week 1:  PT Short Term Goal 1 (Week 1): Pt will perform bed mobility with mod assist PT Short Term Goal 2 (Week 1): Pt will perform SB transfer with max A +2 PT Short Term Goal 3 (Week 1): Pt will tolerate sitting in WC >2 hours between therapies PT Short Term Goal 4 (Week 1): Pt will perform sit>stand with +2 assist  Skilled Therapeutic Interventions/Progress Updates:    pt received in bed and agreeable to therapy. Pt requested to use female urinal and total A for time. Pt able to void bladder. Pt then directed in donning shorts at bed level, max A. Pt requested to do therapy in bed if possible reported she felt tired and a little nauseous this AM. Pt directed in 2x10 quad sets, glut squeezes, bicep curls, chest press, and front raises, in sitting upright in bed position. Pt also setup with Bil PRAFO boots on, wedge pillow and additional pillows at foot of bed for positioning to direct pt in modified stepping with BLE supported in bed to promote increased tolerance to weight in Bil feet and stepping pattern within pt's pain tolerance. 3x20 each foot completed. Pt denied increase in pain. Nursing present for medication during session. Pt left at end of session, All needs in reach and in good condition. Call light in hand.    Session 2: PT present at pt bedside for skilled PT treatment session. Pt denied to participate in various attempts at treatment options reporting she felt nausea and unable to get OOB or transfer EOB. Pt also denied bed level exercises. Pt reported she wanted to rest in bed until next therapy session. Pt reported she had gotten medication for nausea but it had not helped at that time. Pt  continued to deny to participate despite best efforts and education on importance of participation with therapy.   Therapy Documentation Precautions:  Precautions Precautions: Fall Precaution Comments: Gangrenous changes in hands Rt>Lt, open foot wounds bilaterally Restrictions Weight Bearing Restrictions: Yes RLE Weight Bearing: Weight bearing as tolerated LLE Weight Bearing: Weight bearing as tolerated Other Position/Activity Restrictions: WBAT B LEs while wearing compression socks + Darco shoes General: PT Amount of Missed Time (min): 60 Minutes PT Missed Treatment Reason: Patient ill (Comment);Patient fatigue Vital Signs: Therapy Vitals Pulse Rate: 76 BP: 110/62 Pain:   Mobility:   Locomotion :    Trunk/Postural Assessment :    Balance:   Exercises:   Other Treatments:      Therapy/Group: Individual Therapy  Barbaraann Faster 11/29/2020, 11:56 AM

## 2020-11-29 NOTE — Progress Notes (Signed)
Occupational Therapy Session Note  Patient Details  Name: [name] [name] MRN: [id number] Date of Birth: 1961-04-21  Today's Date: 11/29/2020 OT Individual Time: 1335-1435 OT Individual Time Calculation (min): 60 min   Short Term Goals: Week 1:  OT Short Term Goal 1 (Week 1): Pt will sit EOB for ~5 minutes while engaged in an ADL task to increase upright tolerance OT Short Term Goal 2 (Week 1): Pt will complete LB dressing at sit<stand level with LRAD OT Short Term Goal 3 (Week 1): Pt don an overhead shirt with no more than Mod A using adaptive strategies as needed  Skilled Therapeutic Interventions/Progress Updates:    Pt greeted in bed and premedicated for pain. She really wanted to wash her hair. Declined attempting slideboard transfer becauese she doesn't feel ready to use it yet. +2 for maxi move transfer, Max A of 1 for rolling Rt>Lt in order for 2nd person to position pad. Once she was seated in the [facility], worked on pt education by instructing her how to back up/change directions using the joystick. She positioned the PWC in front of the sink with vcs and Min A. OT then used the hair washing tray to wash pts hair. Mod-Max A to brush and blow-dry hair for safety of hands. Pts affect was visibly brightened afterwards. She requested to return to bed, +2 for maxi transfer back to bed. She remained comfortably in bed with all needs within reach and bed alarm set.   Therapy Documentation Precautions:  Precautions Precautions: Fall Precaution Comments: Gangrenous changes in hands Rt>Lt, open foot wounds bilaterally Restrictions Weight Bearing Restrictions: Yes RLE Weight Bearing: Non weight bearing LLE Weight Bearing: Non weight bearing Other Position/Activity Restrictions: WBAT B LEs while wearing compression socks + Darco shoes Vital Signs: Therapy Vitals BP: 112/64 Pain: Pain Assessment Pain Scale: 0-10 Pain Score: 6  Pain Type: Acute pain Pain Location: Foot Pain Orientation:  Right;Left Pain Descriptors / Indicators: Aching Pain Frequency: Constant Pain Onset: On-going Patients Stated Pain Goal: 4 Pain Intervention(s): Medication (See eMAR) ADL: ADL Eating: Not assessed Grooming: Moderate assistance Where Assessed-Grooming: Bed level Upper Body Bathing: Supervision/safety Where Assessed-Upper Body Bathing: Bed level Lower Body Bathing: Other (comment) (+2 assist) Where Assessed-Lower Body Bathing: Bed level Upper Body Dressing: Moderate assistance Where Assessed-Upper Body Dressing: Bed level Lower Body Dressing: Other (Comment) (+2 assist) Where Assessed-Lower Body Dressing: Bed level Toileting: Other (Comment) (+2 assist) Where Assessed-Toileting: Bed level Toilet Transfer: Not assessed Tub/Shower Transfer: Not assessed ADL Comments: ADL assessment very limited due to heightened pain     Therapy/Group: Individual Therapy  Rodderick Holtzer A Omare Bilotta 11/29/2020, 4:02 PM

## 2020-11-29 NOTE — Progress Notes (Signed)
PROGRESS NOTE   Subjective/Complaints:  Pt reports loves air mattress- much more comfortable.  Sat up in w/c 3 hrs yesterday and was able to go outside with PT- feels overall better.  Excited washing hair today- let her know, she can have shower if OK with OT.  ROS:  Pt denies SOB, abd pain, CP, N/V/C/D, and vision changes   Objective:   No results found. No results for input(s): WBC, HGB, HCT, PLT in the last 72 hours. No results for input(s): NA, K, CL, CO2, GLUCOSE, BUN, CREATININE, CALCIUM in the last 72 hours.  Intake/Output Summary (Last 24 hours) at 11/29/2020 0849 Last data filed at 11/29/2020 0700 Gross per 24 hour  Intake 840 ml  Output 300 ml  Net 540 ml        Physical Exam: Vital Signs Blood pressure 100/61, pulse 72, temperature 98.2 F (36.8 C), resp. rate 18, height 5\' 4"  (1.626 m), weight 84.4 kg, last menstrual period 11/22/2020, SpO2 96 %.         General: awake, alert, appropriate, sitting up slightly in a low air loss mattress;  Wearing PRAFOs B/L- legs turned slightly outward; NAD HENT: conjugate gaze; oropharynx moist CV: regular rate; no JVD Pulmonary: CTA B/L; no W/R/R- good air movement GI: soft, NT, ND, (+)BS Psychiatric: appropriate- quiet, but a little brighter Neurological: alert- STM issues noted- needed to write down to have husband bring needed a lot of cues 0/5 DF on R: 2-/5 on L- losing ROM of B/L ankles- has no more than 45 degrees on R and 50-60 degrees on L without pain- no significant change today, but R foot hanging off bed/off wedge that was propping ankles.  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  Neurologic: Cranial nerves II through XII intact, motor strength is 4/5  in bilateral deltoid, bicep, tricep, grip, 3-hip flexor, 4- knee extensors, 0/5 on R: 2-/5  Musculoskeletal: no change in ROM of DIPs or ankles, but appears less bleeding on feet/ankles and more moisturized   Assessment/Plan: 1. Functional deficits which require 3+ hours per day of interdisciplinary therapy in a comprehensive inpatient rehab setting.  Physiatrist is providing close team supervision and 24 hour management of active medical problems listed below.  Physiatrist and rehab team continue to assess barriers to discharge/monitor patient progress toward functional and medical goals  Care Tool:  Bathing    Body parts bathed by patient: Right arm,Left arm,Chest,Abdomen,Face   Body parts bathed by helper: Right lower leg,Left lower leg,Left upper leg,Right upper leg     Bathing assist Assist Level: Moderate Assistance - Patient 50 - 74% (declined LB bathing buttocks/periarea)     Upper Body Dressing/Undressing Upper body dressing   What is the patient wearing?: Pull over shirt    Upper body assist Assist Level: Moderate Assistance - Patient 50 - 74%    Lower Body Dressing/Undressing Lower body dressing      What is the patient wearing?: Pants  Lower body assist Assist for lower body dressing: 2 Helpers     Toileting Toileting    Toileting assist Assist for toileting: Dependent - Patient 0%     Transfers Chair/bed transfer  Transfers assist     Chair/bed transfer assist level: Dependent - mechanical lift     Locomotion Ambulation   Ambulation assist   Ambulation activity did not occur: Safety/medical concerns          Walk 10 feet activity   Assist  Walk 10 feet activity did not occur: Safety/medical concerns        Walk 50 feet activity   Assist Walk 50 feet with 2 turns activity did not occur: Safety/medical concerns         Walk 150 feet activity   Assist Walk 150 feet activity did not occur: Safety/medical concerns          Walk 10 feet on uneven surface  activity   Assist Walk 10 feet on uneven surfaces activity did not occur: Safety/medical concerns         Wheelchair     Assist Will patient use wheelchair at discharge?: Yes Type of Wheelchair: Power    Wheelchair assist level: Supervision/Verbal cueing Max wheelchair distance: 300'    Wheelchair 50 feet with 2 turns activity    Assist        Assist Level: Supervision/Verbal cueing   Wheelchair 150 feet activity     Assist      Assist Level: Supervision/Verbal cueing   Blood pressure 100/61, pulse 72, temperature 98.2 F (36.8 C), resp. rate 18, height 5\' 4"  (1.626 m), weight 84.4 kg, last menstrual period 11/22/2020, SpO2 96 %.   Medical Problem List and Plan: 1.  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             -ELOS/Goals: 17-20 days/Min A             -con't PT and OT- got PRAFOs and wearing- says they feel OK- no stnading yet- will allow PT to shower pt if they can? 2.  PE/Antithrombotics: -DVT/anticoagulation:  Pharmaceutical: Other (comment)--Eliquis- discussed with Pt and son (at bedside )              -antiplatelet therapy: N/A 3. Pain Management: Oxycodone 5mg  q 6 h prn, also  Gabapentin 300mg  BID - increase to TID  5/18- increased gabapentin to 400 mg QID today and added Lidocaine ointment q4 hours prn max 4x/day  5/19- pain doing better- con't meds/regimen- pt doesn't want to change timing of Lidocaine ointment from prn- just didn't ask yesterday- forgot  5/20- says feeling better- con't regimen             Monitor with increased exertion 4. Mood: LCSW to follow for evaluation and support. Team to offer ego support.   5/16- will increase Lexapro to 20 mg daily             -antipsychotic agents: N/A 5. Neuropsych: This patient is capable of making decisions on her own behalf. 6.  Skin/Wound Care:              Prevalon boots for use when in bed. Continue left hand splint.              --Darco shoe for weight bearing per PT input. No  sig changes vs pictures taken 5/6 in ortho consult  5/16- losing ROM of ankles B/L- will see if able to put in PRAFOs or something at night? Asking Dr Lajoyce Corners   5/18- approved to do silvadene and wound care for feet as well as PRAFOs for ankles- to wear at night.  5/20- wearing PRAFOs at night- doing better- con't regimen  7. Fluids/Electrolytes/Nutrition: Monitor  I/Os             CMP ordered             Added protein  supplements to help promote wound healing.  8.  PAF: Monitor HR tid--continue Metoprolol and Cardizem for rate control.             Vitals:   11/28/20 1937 11/29/20 0434  BP: 101/66 100/61  Pulse: 76 72  Resp: 18 18  Temp: 99 F (37.2 C) 98.2 F (36.8 C)  SpO2: 100% 96%   5/18- BP still borderline soft, but no complaints of orthostatic hypotension?- con't regimen 9. Dysphagia: Continue aspiration precautions--No straws.               Advance diet as tolerated  5/16- SLP to address/evaluate- con't regimen 10. ABLA              CBC ordered  5/16- Hb 11.3- better- con't regimen 11. Forming contractures- DIPs B/L and ankles  5/17- will ask Dr Lajoyce Corners and order eucerin and ROM of fingers b/l per pt request- explained to her, need to do ankle ROM/esp DF since will lose ability to walk if we do not do it.    5/18- starting PRAFOs at night/and wedge at end of bed to prevent worsening contractures  5/20- wearing PRAFOs/ also will try and get some "arthritis gloves" for hands to help with forming DIP contractures? 12. Loose stools  5/19- went to reduce bowel meds- but only on prns- might be supplements? Nothing to reduce- if continues, will possibly use Imodium.   5/20- no complaints today 13. Dispo  5/20- helped pt take notes to show OT and husband.   LOS: 7 days A FACE TO FACE EVALUATION WAS PERFORMED  [name] [name] 11/29/2020, 8:49 AM

## 2020-11-30 DIAGNOSIS — D62 Acute posthemorrhagic anemia: Secondary | ICD-10-CM | POA: Diagnosis not present

## 2020-11-30 DIAGNOSIS — I96 Gangrene, not elsewhere classified: Secondary | ICD-10-CM | POA: Diagnosis not present

## 2020-11-30 DIAGNOSIS — G7281 Critical illness myopathy: Secondary | ICD-10-CM

## 2020-11-30 DIAGNOSIS — I480 Paroxysmal atrial fibrillation: Secondary | ICD-10-CM

## 2020-11-30 DIAGNOSIS — R1310 Dysphagia, unspecified: Secondary | ICD-10-CM

## 2020-11-30 DIAGNOSIS — G5793 Unspecified mononeuropathy of bilateral lower limbs: Secondary | ICD-10-CM

## 2020-11-30 NOTE — Progress Notes (Addendum)
Occupational Therapy Session Note  Patient Details  Name: [name] [name] MRN: [id number] Date of Birth: 04-Jan-1961  Today's Date: 11/30/2020 OT Individual Time: 0017-4944 OT Individual Time Calculation (min): 38 min  22 minutes missed  Short Term Goals: Week 1:  OT Short Term Goal 1 (Week 1): Pt will sit EOB for ~5 minutes while engaged in an ADL task to increase upright tolerance OT Short Term Goal 2 (Week 1): Pt will complete LB dressing at sit<stand level with LRAD OT Short Term Goal 3 (Week 1): Pt don an overhead shirt with no more than Mod A using adaptive strategies as needed  Skilled Therapeutic Interventions/Progress Updates:    Pt greeted in bed and premedicated for pain. Pt declined participating in ADLs, stating her spouse would assist her later in the day. She also declined transferring to the [facility], becoming teary and then crying. Pt reported feeling overwhelmed by her therapeutic goals/medical journey, also still "mourning" loss of her daughter. Discussed at length emotional coping strategies such as journaling, music listening, and talking about her feelings to increase her ability to fully participate in therapies. Provided her with an aromatherapy blend to promote calmness/relaxation. Also provided her with a journal and built up writing implement. Mod A of 1 for transition to long sitting in order for OT to brush and fix pts hair into a ponytail. Pt remained comfortably in bed at end of session, all needs within reach. Time missed due to pt refusal, wanting some time to herself today.  Therapy Documentation Precautions:  Precautions Precautions: Fall Precaution Comments: Gangrenous changes in hands Rt>Lt, open foot wounds bilaterally Restrictions Weight Bearing Restrictions: Yes RLE Weight Bearing: Non weight bearing LLE Weight Bearing: Non weight bearing Other Position/Activity Restrictions: WBAT B LEs while wearing compression socks + Darco shoes Pain Assessment Pain  Scale: 0-10 Pain Score: 6  Pain Type: Acute pain Pain Location: Foot Pain Orientation: Right;Left Pain Descriptors / Indicators: Aching Pain Frequency: Constant Pain Onset: On-going Patients Stated Pain Goal: 4 Pain Intervention(s): Medication (See eMAR) ADL: ADL Eating: Not assessed Grooming: Moderate assistance Where Assessed-Grooming: Bed level Upper Body Bathing: Supervision/safety Where Assessed-Upper Body Bathing: Bed level Lower Body Bathing: Other (comment) (+2 assist) Where Assessed-Lower Body Bathing: Bed level Upper Body Dressing: Moderate assistance Where Assessed-Upper Body Dressing: Bed level Lower Body Dressing: Other (Comment) (+2 assist) Where Assessed-Lower Body Dressing: Bed level Toileting: Other (Comment) (+2 assist) Where Assessed-Toileting: Bed level Toilet Transfer: Not assessed Tub/Shower Transfer: Not assessed ADL Comments: ADL assessment very limited due to heightened pain      Therapy/Group: Individual Therapy  Even Budlong A Sixto Bowdish 11/30/2020, 12:32 PM

## 2020-11-30 NOTE — Progress Notes (Signed)
PROGRESS NOTE   Subjective/Complaints: Patient seen sitting up in bed this morning.  She states she slept well overnight.  She has questions regarding erythema in her hands- likely secondary to pressure from bed rails.  ROS: Denies CP, SOB, N/V/D  Objective:   No results found. No results for input(s): WBC, HGB, HCT, PLT in the last 72 hours. No results for input(s): NA, K, CL, CO2, GLUCOSE, BUN, CREATININE, CALCIUM in the last 72 hours.  Intake/Output Summary (Last 24 hours) at 11/30/2020 1253 Last data filed at 11/29/2020 1808 Gross per 24 hour  Intake 240 ml  Output --  Net 240 ml        Physical Exam: Vital Signs Blood pressure (!) 103/52, pulse 75, temperature 98.2 F (36.8 C), temperature source Oral, resp. rate 18, height 5\' 4"  (1.626 m), weight 86.2 kg, last menstrual period 11/22/2020, SpO2 97 %. Constitutional: No distress . Vital signs reviewed.  Obese. HENT: Normocephalic.  Atraumatic. Eyes: EOMI. No discharge. Cardiovascular: No JVD.  RRR. Respiratory: Normal effort.  No stridor.  Bilateral clear to auscultation. GI: Non-distended.  BS +. Skin: Warm and dry.   Dry gangrene distal extremities Scattered areas of eschar Psych: Normal mood.  Normal behavior. Musc: No edema in extremities.  No tenderness in extremities. Neuro: Alert Motor:  Motor: B/l UE: Shoulder abduction, elbow flexion/extention 4/5, hand limited to due pain.  RLE: HF, KE 4/5, ADF 0/5 (some pain inhibition) LLE: HF, KE 4/5, ADF 2-/5 (some pain inhibition)   Assessment/Plan: 1. Functional deficits which require 3+ hours per day of interdisciplinary therapy in a comprehensive inpatient rehab setting.  Physiatrist is providing close team supervision and 24 hour management of active medical problems listed below.  Physiatrist and rehab team continue to assess barriers to discharge/monitor patient progress toward functional and medical  goals  Care Tool:  Bathing    Body parts bathed by patient: Right arm,Left arm,Chest,Abdomen,Face   Body parts bathed by helper: Right lower leg,Left lower leg,Left upper leg,Right upper leg     Bathing assist Assist Level: Moderate Assistance - Patient 50 - 74% (declined LB bathing buttocks/periarea)     Upper Body Dressing/Undressing Upper body dressing   What is the patient wearing?: Pull over shirt    Upper body assist Assist Level: Moderate Assistance - Patient 50 - 74%    Lower Body Dressing/Undressing Lower body dressing      What is the patient wearing?: Pants     Lower body assist Assist for lower body dressing: 2 Helpers     Toileting Toileting    Toileting assist Assist for toileting: Dependent - Patient 0%     Transfers Chair/bed transfer  Transfers assist     Chair/bed transfer assist level: Dependent - mechanical lift     Locomotion Ambulation   Ambulation assist   Ambulation activity did not occur: Safety/medical concerns          Walk 10 feet activity   Assist  Walk 10 feet activity did not occur: Safety/medical concerns        Walk 50 feet activity   Assist Walk 50 feet with 2 turns activity did not occur: Safety/medical concerns  Walk 150 feet activity   Assist Walk 150 feet activity did not occur: Safety/medical concerns         Walk 10 feet on uneven surface  activity   Assist Walk 10 feet on uneven surfaces activity did not occur: Safety/medical concerns         Wheelchair     Assist Will patient use wheelchair at discharge?: Yes Type of Wheelchair: Power    Wheelchair assist level: Supervision/Verbal cueing Max wheelchair distance: 300'    Wheelchair 50 feet with 2 turns activity    Assist        Assist Level: Supervision/Verbal cueing   Wheelchair 150 feet activity     Assist      Assist Level: Supervision/Verbal cueing   Blood pressure (!) 103/52, pulse 75,  temperature 98.2 F (36.8 C), temperature source Oral, resp. rate 18, height 5\' 4"  (1.626 m), weight 86.2 kg, last menstrual period 11/22/2020, SpO2 97 %.   Medical Problem List and Plan: 1.  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  PE/Antithrombotics: -DVT/anticoagulation:  Pharmaceutical: Other (comment)--Eliquis             -antiplatelet therapy: N/A 3. Pain Management: Oxycodone 5mg  q 6 h prn, also  Gabapentin 300mg  BID - increase to TID  Increased gabapentin to 400 mg QID   Lidocaine ointment prn  Controlled with meds on 5/21             Monitor with increased exertion 4. Mood: LCSW to follow for evaluation and support. Team to offer ego support.   Increased Lexapro to 20 mg daily on 5/16             -antipsychotic agents: N/A 5. Neuropsych: This patient is capable of making decisions on her own behalf. 6. Skin/Wound Care:              Prevalon boots for use when in bed. Continue left hand splint.              --Darco shoe for weight bearing per PT input.   5/18- approved to do silvadene and wound care for feet as well as PRAFOs for ankles- to wear at night. 7. Fluids/Electrolytes/Nutrition: Monitor  I/Os             Added protein  supplements to help promote wound healing.  8.  PAF: Monitor HR tid--continue Metoprolol and Cardizem for rate control.             Vitals:   11/30/20 0020 11/30/20 0339  BP: 98/60 (!) 103/52  Pulse:  75  Resp:  18  Temp:  98.2 F (36.8 C)  SpO2:  97%   Rate controlled on 5/21 9. Dysphagia: Continue aspiration precautions--No straws.               Advance diet as tolerated  Intermittent supervision 10. ABLA              hemoglobin 11.3 on 5/16, labs ordered for Monday 11. Forming contractures- DIPs B/L and ankles  Continue PRAFOs nightly also will try and get some "arthritis gloves" for hands to help with forming DIP contractures? 12. Loose stools  Improved  LOS: 8 days A FACE  TO FACE EVALUATION WAS PERFORMED  Armend Hochstatter 6/21 11/30/2020, 12:53 PM

## 2020-12-01 DIAGNOSIS — G5793 Unspecified mononeuropathy of bilateral lower limbs: Secondary | ICD-10-CM | POA: Diagnosis not present

## 2020-12-01 DIAGNOSIS — I96 Gangrene, not elsewhere classified: Secondary | ICD-10-CM | POA: Diagnosis not present

## 2020-12-01 DIAGNOSIS — G7281 Critical illness myopathy: Secondary | ICD-10-CM | POA: Diagnosis not present

## 2020-12-01 DIAGNOSIS — D62 Acute posthemorrhagic anemia: Secondary | ICD-10-CM | POA: Diagnosis not present

## 2020-12-01 NOTE — Progress Notes (Addendum)
PROGRESS NOTE   Subjective/Complaints: Patient seen sitting up in bed this morning.  She states she slept well overnight.  She notes mild pain in her fingertips.  ROS: Denies CP, SOB, N/V/D  Objective:   No results found. No results for input(s): WBC, HGB, HCT, PLT in the last 72 hours. No results for input(s): NA, K, CL, CO2, GLUCOSE, BUN, CREATININE, CALCIUM in the last 72 hours.  Intake/Output Summary (Last 24 hours) at 12/01/2020 0934 Last data filed at 12/01/2020 0745 Gross per 24 hour  Intake 940 ml  Output --  Net 940 ml        Physical Exam: Vital Signs Blood pressure 114/68, pulse 69, temperature 98.4 F (36.9 C), temperature source Oral, resp. rate 18, height 5\' 4"  (1.626 m), weight 84.8 kg, last menstrual period 11/22/2020, SpO2 98 %. Constitutional: No distress . Vital signs reviewed.  Obese. HENT: Normocephalic.  Atraumatic. Eyes: EOMI. No discharge. Cardiovascular: No JVD.  RRR. Respiratory: Normal effort.  No stridor.  Bilateral clear to auscultation. GI: Non-distended.  BS +. Skin: Warm and dry.   Dry gangrene distal digits Scattered areas of eschar Bilateral hands with blanchable erythema on palms Psych: Normal mood.  Normal behavior. Musc: No edema in extremities.  No tenderness in extremities. Neuro: Alert Motor:  Motor: B/l UE: Shoulder abduction, elbow flexion/extention 4/5, hand limited to due pain, stable.  RLE: HF, KE 4/5, ADF 0/5 (some pain inhibition) LLE: HF, KE 4/5, ADF 2-/5 (some pain inhibition)   Assessment/Plan: 1. Functional deficits which require 3+ hours per day of interdisciplinary therapy in a comprehensive inpatient rehab setting.  Physiatrist is providing close team supervision and 24 hour management of active medical problems listed below.  Physiatrist and rehab team continue to assess barriers to discharge/monitor patient progress toward functional and medical  goals  Care Tool:  Bathing    Body parts bathed by patient: Right arm,Left arm,Chest,Abdomen,Face   Body parts bathed by helper: Right lower leg,Left lower leg,Left upper leg,Right upper leg     Bathing assist Assist Level: Moderate Assistance - Patient 50 - 74% (declined LB bathing buttocks/periarea)     Upper Body Dressing/Undressing Upper body dressing   What is the patient wearing?: Pull over shirt    Upper body assist Assist Level: Moderate Assistance - Patient 50 - 74%    Lower Body Dressing/Undressing Lower body dressing      What is the patient wearing?: Pants     Lower body assist Assist for lower body dressing: 2 Helpers     Toileting Toileting    Toileting assist Assist for toileting: Dependent - Patient 0%     Transfers Chair/bed transfer  Transfers assist     Chair/bed transfer assist level: Dependent - mechanical lift     Locomotion Ambulation   Ambulation assist   Ambulation activity did not occur: Safety/medical concerns          Walk 10 feet activity   Assist  Walk 10 feet activity did not occur: Safety/medical concerns        Walk 50 feet activity   Assist Walk 50 feet with 2 turns activity did not occur: Safety/medical concerns  Walk 150 feet activity   Assist Walk 150 feet activity did not occur: Safety/medical concerns         Walk 10 feet on uneven surface  activity   Assist Walk 10 feet on uneven surfaces activity did not occur: Safety/medical concerns         Wheelchair     Assist Will patient use wheelchair at discharge?: Yes Type of Wheelchair: Power    Wheelchair assist level: Supervision/Verbal cueing Max wheelchair distance: 300'    Wheelchair 50 feet with 2 turns activity    Assist        Assist Level: Supervision/Verbal cueing   Wheelchair 150 feet activity     Assist      Assist Level: Supervision/Verbal cueing   Blood pressure 114/68, pulse 69,  temperature 98.4 F (36.9 C), temperature source Oral, resp. rate 18, height 5\' 4"  (1.626 m), weight 84.8 kg, last menstrual period 11/22/2020, SpO2 98 %.   Medical Problem List and Plan: 1.  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  PE/Antithrombotics: -DVT/anticoagulation:  Pharmaceutical: Other (comment)--Eliquis             -antiplatelet therapy: N/A 3. Pain Management: Oxycodone 5mg  q 6 h prn, also  Gabapentin 300mg  BID - increase to TID  Increased gabapentin to 400 mg QID   Lidocaine ointment prn  Relatively controlled with meds on 5/22             Monitor with increased exertion 4. Mood: LCSW to follow for evaluation and support. Team to offer ego support.   Increased Lexapro to 20 mg daily on 5/16             -antipsychotic agents: N/A 5. Neuropsych: This patient is capable of making decisions on her own behalf. 6. Skin/Wound Care:              Prevalon boots for use when in bed. Continue left hand splint.              --Darco shoe for weight bearing per PT input.   5/18- approved to do silvadene and wound care for feet as well as PRAFOs for ankles- to wear at night. 7. Fluids/Electrolytes/Nutrition: Monitor  I/Os             Added protein  supplements to help promote wound healing.  8.  PAF: Monitor HR tid--continue Metoprolol and Cardizem for rate control.             Vitals:   12/01/20 0440 12/01/20 0820  BP: 112/67 114/68  Pulse: 68 69  Resp: 18   Temp: 98.4 F (36.9 C)   SpO2: 98%    Rate controlled on 5/22 9. Dysphagia: Continue aspiration precautions--No straws.               Advance diet as tolerated  Intermittent supervision 10. ABLA              Hemoglobin 11.3 on 5/16, labs ordered for tomorrow 11. Forming contractures- DIPs B/L and ankles  Continue PRAFOs nightly also will try and get some "arthritis gloves" for hands to help with forming DIP contractures? 12. Loose stools  Improved  LOS: 9  days A FACE TO FACE EVALUATION WAS PERFORMED  Danielly Ackerley 01-02-1998 12/01/2020, 9:34 AM

## 2020-12-02 DIAGNOSIS — R5381 Other malaise: Secondary | ICD-10-CM | POA: Diagnosis not present

## 2020-12-02 LAB — BASIC METABOLIC PANEL
Anion gap: 6 (ref 5–15)
BUN: <5 mg/dL — ABNORMAL LOW (ref 6–20)
CO2: 29 mmol/L (ref 22–32)
Calcium: 9.2 mg/dL (ref 8.9–10.3)
Chloride: 102 mmol/L (ref 98–111)
Creatinine, Ser: 0.4 mg/dL — ABNORMAL LOW (ref 0.44–1.00)
GFR, Estimated: >60 mL/min (ref >60)
Glucose, Bld: 100 mg/dL — ABNORMAL HIGH (ref 70–99)
Potassium: 3.6 mmol/L (ref 3.5–5.1)
Sodium: 137 mmol/L (ref 135–145)

## 2020-12-02 LAB — CBC
HCT: 35.7 % — ABNORMAL LOW (ref 36.0–46.0)
Hemoglobin: 11 g/dL — ABNORMAL LOW (ref 12.0–15.0)
MCH: 30.1 pg (ref 26.0–34.0)
MCHC: 30.8 g/dL (ref 30.0–36.0)
MCV: 97.5 fL (ref 80.0–100.0)
Platelets: 433 10*3/uL — ABNORMAL HIGH (ref 150–400)
RBC: 3.66 MIL/uL — ABNORMAL LOW (ref 3.87–5.11)
RDW: 16.7 % — ABNORMAL HIGH (ref 11.5–15.5)
WBC: 6.7 10*3/uL (ref 4.0–10.5)
nRBC: 0 % (ref 0.0–0.2)

## 2020-12-02 NOTE — Progress Notes (Signed)
Occupational Therapy Session Note  Patient Details  Name: [name] [name] MRN: [id number] Date of Birth: December 25, 1960  Today's Date: 12/02/2020 OT Individual Time: 1420-1445 OT Individual Time Calculation (min): 25 min   Short Term Goals: Week 1:  OT Short Term Goal 1 (Week 1): Pt will sit EOB for ~5 minutes while engaged in an ADL task to increase upright tolerance OT Short Term Goal 1 - Progress (Week 1): Met OT Short Term Goal 2 (Week 1): Pt will complete LB dressing at sit<stand level with LRAD OT Short Term Goal 2 - Progress (Week 1): Not met OT Short Term Goal 3 (Week 1): Pt don an overhead shirt with no more than Mod A using adaptive strategies as needed OT Short Term Goal 3 - Progress (Week 1): Met  Skilled Therapeutic Interventions/Progress Updates:    Pt greeted in bed with no s/s pain. Agreeable to session. Worked on core strengthening by long sitting in bed and dancing in beat to meaningful music. Pt initially required Min balance assist, progressing to sitting unsupported on her own, swaying trunk and moving upper limbs, affect bright. She needed rest breaks in between songs. At end of session pt remained in care of NT for assessment of vitals.   Therapy Documentation Precautions:  Precautions Precautions: Fall Precaution Comments: Gangrenous changes in hands Rt>Lt, open foot wounds bilaterally Restrictions Weight Bearing Restrictions: Yes RLE Weight Bearing: Non weight bearing LLE Weight Bearing: Non weight bearing Other Position/Activity Restrictions: WBAT B LEs while wearing compression socks + Darco shoes Vital Signs: Therapy Vitals Temp: 98.6 F (37 C) Pulse Rate: 71 Resp: 16 BP: 98/77 Patient Position (if appropriate): Sitting Oxygen Therapy SpO2: 97 % O2 Device: Room Air Pain: Pain Assessment Pain Scale: 0-10 Pain Score: 7  Pain Type: Acute pain Pain Location: Foot Pain Orientation: Right;Left Pain Descriptors / Indicators: Aching Pain Frequency:  Constant Pain Onset: Gradual Patients Stated Pain Goal: 2 Pain Intervention(s): Medication (See eMAR) ADL: ADL Eating: Not assessed Grooming: Moderate assistance Where Assessed-Grooming: Bed level Upper Body Bathing: Supervision/safety Where Assessed-Upper Body Bathing: Bed level Lower Body Bathing: Other (comment) (+2 assist) Where Assessed-Lower Body Bathing: Bed level Upper Body Dressing: Moderate assistance Where Assessed-Upper Body Dressing: Bed level Lower Body Dressing: Other (Comment) (+2 assist) Where Assessed-Lower Body Dressing: Bed level Toileting: Other (Comment) (+2 assist) Where Assessed-Toileting: Bed level Toilet Transfer: Not assessed Tub/Shower Transfer: Not assessed ADL Comments: ADL assessment very limited due to heightened pain      Therapy/Group: Individual Therapy  Tegan Burnside A Abdulhadi Stopa 12/02/2020, 4:15 PM

## 2020-12-02 NOTE — Progress Notes (Signed)
PROGRESS NOTE   Subjective/Complaints:  Pt reports pain is controlled- having meltdowns "most mornings" because misses her 5 years son as well as her daughter who died 06-26-23.  Also feels better wearing PRAFOs- feels good on legs/feet.  Pain and bowels going well.    ROS:  Pt denies SOB, abd pain, CP, N/V/C/D, and vision changes   Objective:   No results found. Recent Labs    12/02/20 0644  WBC 6.7  HGB 11.0*  HCT 35.7*  PLT 433*   Recent Labs    12/02/20 0644  NA 137  K 3.6  CL 102  CO2 29  GLUCOSE 100*  BUN <5*  CREATININE 0.40*  CALCIUM 9.2    Intake/Output Summary (Last 24 hours) at 12/02/2020 0837 Last data filed at 12/02/2020 0518 Gross per 24 hour  Intake 360 ml  Output 1150 ml  Net -790 ml        Physical Exam: Vital Signs Blood pressure 90/62, pulse 77, temperature 98.4 F (36.9 C), temperature source Oral, resp. rate 16, height 5\' 4"  (1.626 m), weight 85 kg, last menstrual period 11/22/2020, SpO2 99 %.    General: awake, alert, appropriate, slid down in bed- with SLP put back up in bed; NAD HENT: conjugate gaze; oropharynx moist CV: regular rate; no JVD Pulmonary: CTA B/L; no W/R/R- good air movement GI: soft, NT, ND, (+)BS Psychiatric: appropriate- brighter this AM Neurological: alert- STM issues notable, but a little better Skin: Warm and dry.   Dry gangrene distal digits Scattered areas of eschar Bilateral hands with blanchable erythema on palms-no change; new skin not as sensitive Musc: No edema in extremities.  No tenderness in extremities. Motor:  Motor: B/l UE: Shoulder abduction, elbow flexion/extention 4/5, hand limited to due pain, stable.  RLE: HF, KE 4/5, ADF 0/5 (some pain inhibition) LLE: HF, KE 4/5, ADF 2-/5 (some pain inhibition)   Assessment/Plan: 1. Functional deficits which require 3+ hours per day of interdisciplinary therapy in a comprehensive inpatient rehab  setting.  Physiatrist is providing close team supervision and 24 hour management of active medical problems listed below.  Physiatrist and rehab team continue to assess barriers to discharge/monitor patient progress toward functional and medical goals  Care Tool:  Bathing    Body parts bathed by patient: Right arm,Left arm,Chest,Abdomen,Face   Body parts bathed by helper: Right lower leg,Left lower leg,Left upper leg,Right upper leg     Bathing assist Assist Level: Moderate Assistance - Patient 50 - 74% (declined LB bathing buttocks/periarea)     Upper Body Dressing/Undressing Upper body dressing   What is the patient wearing?: Pull over shirt    Upper body assist Assist Level: Moderate Assistance - Patient 50 - 74%    Lower Body Dressing/Undressing Lower body dressing      What is the patient wearing?: Pants     Lower body assist Assist for lower body dressing: 2 Helpers     Toileting Toileting    Toileting assist Assist for toileting: Dependent - Patient 0%     Transfers Chair/bed transfer  Transfers assist     Chair/bed transfer assist level: Dependent - mechanical lift     Locomotion  Ambulation   Ambulation assist   Ambulation activity did not occur: Safety/medical concerns          Walk 10 feet activity   Assist  Walk 10 feet activity did not occur: Safety/medical concerns        Walk 50 feet activity   Assist Walk 50 feet with 2 turns activity did not occur: Safety/medical concerns         Walk 150 feet activity   Assist Walk 150 feet activity did not occur: Safety/medical concerns         Walk 10 feet on uneven surface  activity   Assist Walk 10 feet on uneven surfaces activity did not occur: Safety/medical concerns         Wheelchair     Assist Will patient use wheelchair at discharge?: Yes Type of Wheelchair: Power    Wheelchair assist level: Supervision/Verbal cueing Max wheelchair distance: 300'     Wheelchair 50 feet with 2 turns activity    Assist        Assist Level: Supervision/Verbal cueing   Wheelchair 150 feet activity     Assist      Assist Level: Supervision/Verbal cueing   Blood pressure 90/62, pulse 77, temperature 98.4 F (36.9 C), temperature source Oral, resp. rate 16, height 5\' 4"  (1.626 m), weight 85 kg, last menstrual period 11/22/2020, SpO2 99 %.   Medical Problem List and Plan: 1.  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  PE/Antithrombotics: -DVT/anticoagulation:  Pharmaceutical: Other (comment)--Eliquis             -antiplatelet therapy: N/A 3. Pain Management: Oxycodone 5mg  q 6 h prn, also  Gabapentin 300mg  BID - increase to TID  Increased gabapentin to 400 mg QID   Lidocaine ointment prn  5/23- pain good control- con't regimen             Monitor with increased exertion 4. Mood: LCSW to follow for evaluation and support. Team to offer ego support.   Increased Lexapro to 20 mg daily on 5/16             -antipsychotic agents: N/A 5. Neuropsych: This patient is capable of making decisions on her own behalf. 6. Skin/Wound Care:              Prevalon boots for use when in bed. Continue left hand splint.              --Darco shoe for weight bearing per PT input.   5/18- approved to do silvadene and wound care for feet as well as PRAFOs for ankles- to wear at night.  5/23- pt likes to wear PRAFOs- explained can wear them any time not in therapy 7. Fluids/Electrolytes/Nutrition: Monitor  I/Os             Added protein  supplements to help promote wound healing.  8.  PAF: Monitor HR tid--continue Metoprolol and Cardizem for rate control.             Vitals:   12/01/20 2255 12/02/20 0505  BP: (!) 105/58 90/62  Pulse: 71 77  Resp:  16  Temp:  98.4 F (36.9 C)  SpO2:  99%   Rate controlled on 5/22 9. Dysphagia: Continue aspiration  precautions--No straws.              Advance diet as tolerated  5/23- SLP still seeing- con't regimen  Intermittent supervision 10. ABLA              Hemoglobin 11.3 on 5/16, labs ordered for tomorrow 11. Forming contractures- DIPs B/L and ankles  Continue PRAFOs nightly also will try and get some "arthritis gloves" for hands to help with forming DIP contractures?  5/23-  con't PRAFOs 12. Loose stools  Improved  LOS: 10 days A FACE TO FACE EVALUATION WAS PERFORMED  Deaaron Fulghum 12/02/2020, 8:37 AM

## 2020-12-02 NOTE — Progress Notes (Signed)
Cardizem not given this morning, BP 90/62. Md notified.

## 2020-12-02 NOTE — Progress Notes (Signed)
Occupational Therapy Weekly Progress Note  Patient Details  Name: [name] [name] MRN: [id number] Date of Birth: 02-May-1961  Beginning of progress report period: Nov 23, 2020 End of progress report period: Dec 02, 2020   Patient has met 2 of 3 short term goals.  Pt can performing slide board transfers OOB with 2 helpers but primarily uses Maximove, LB dressing/bathing at bed level with 2 helpers, toileting at bed level using bed pan/urinal, and is able to sit EOB approx 10-15 minutes for seated ADL. Pt has been severely limited by fear of falling/anxiety and challenges with emotional coping in regards to her medical situation. These factors have limited her OOB tolerance and she presently requires extended time to complete all tasks.   Patient continues to demonstrate the following deficits: muscle weakness, decreased cardiorespiratoy endurance, impaired timing and sequencing and decreased motor planning, decreased motor planning, decreased initiation, decreased problem solving, decreased safety awareness and delayed processing and decreased sitting balance, decreased standing balance, decreased postural control and decreased balance strategies and therefore will continue to benefit from skilled OT intervention to enhance overall performance with BADL and Reduce care partner burden.  Patient progressing toward long term goals..  Continue plan of care.  OT Short Term Goals Week 1:  OT Short Term Goal 1 (Week 1): Pt will sit EOB for ~5 minutes while engaged in an ADL task to increase upright tolerance OT Short Term Goal 1 - Progress (Week 1): Met OT Short Term Goal 2 (Week 1): Pt will complete LB dressing at sit<stand level with LRAD OT Short Term Goal 2 - Progress (Week 1): Not met OT Short Term Goal 3 (Week 1): Pt don an overhead shirt with no more than Mod A using adaptive strategies as needed OT Short Term Goal 3 - Progress (Week 1): Met Week 2:  OT Short Term Goal 1 (Week 2): Pt will  complete slide board transfer with 1 helper in prep for ADL OOB OT Short Term Goal 2 (Week 2): Pt will complete UB dressing no more than Min A OT Short Term Goal 3 (Week 2): Pt will utilize emotional coping strategies with no more than Min cues consistently to increase participation OT Short Term Goal 4 (Week 2): Pt will perform LB dressing with Max A of 1  Therapy Documentation Precautions:  Precautions Precautions: Fall Precaution Comments: Gangrenous changes in hands Rt>Lt, open foot wounds bilaterally Restrictions Weight Bearing Restrictions: Yes RLE Weight Bearing: Non weight bearing LLE Weight Bearing: Non weight bearing Other Position/Activity Restrictions: WBAT B LEs while wearing compression socks + Darco shoes    Therapy/Group: Individual Therapy  [name] 12/02/2020, 7:21 AM

## 2020-12-02 NOTE — Progress Notes (Signed)
Speech Language Pathology Weekly Progress and Session Note  Patient Details  Name: [name] [name] MRN: [id number] Date of Birth: [birth year]  Beginning of progress report period: Nov 25, 2020 End of progress report period: Dec 02, 2020  Today's Date: 12/02/2020 SLP Individual Time: 5009-3818 SLP Individual Time Calculation (min): 41 min  Short Term Goals: Week 1: SLP Short Term Goal 1 (Week 1): Pt will demonstrate complex problem solving in functional task (ex: medication/money/time management) with supervison A verbal cues. SLP Short Term Goal 2 (Week 1): Pt will demonstrate alternating attention during complex tasks in 10 minute intervals with supervision A verbal cues. SLP Short Term Goal 3 (Week 1): Pt will demonstrate recall of novel, daily information with supervision A verbal cues. SLP Short Term Goal 4 (Week 1): Pt will participate in higher level word finding task (ex: divergent and convergent naming) with supervision A semantic cues.    New Short Term Goals: Week 2: SLP Short Term Goal 1 (Week 2): Pt will demonstrate complex problem solving in functional task (ex: medication/money/time management) with supervison A verbal cues. SLP Short Term Goal 2 (Week 2): Pt will demonstrate alternating attention during complex tasks in 10 minute intervals with Mod I verbal cues. SLP Short Term Goal 3 (Week 2): Pt will demonstrate recall of novel, daily information with Mod I verbal cues. SLP Short Term Goal 4 (Week 2): Pt will participate in higher level word finding task (ex: divergent and convergent naming) with supervision A semantic cues.  Weekly Progress Updates: Pt has met 2/4 short term goals this reporting period due to improved attention and improved memory. She continues to require min A for complex problem solving tasks and needs increased time to process information. She reports some improvement in word finding but overall has moments of difficulty with word retrieval during  conversation and when explaining her thoughts. Pt education Is ongoing. She will benefit from continued skilled SLP intervention to maximize independence.      Intensity: Minumum of 1-2 x/day, 30 to 90 minutes Frequency: 1 to 3 out of 7 days Duration/Length of Stay: 3-4 weeks Treatment/Interventions: Cognitive remediation/compensation;Cueing hierarchy;Internal/external aids;Functional tasks;Medication managment;Patient/family education;Speech/Language facilitation   Daily Session  Skilled Therapeutic Interventions:Skilled ALP intervention focused on cognition. Pt requested to not have therapy before 9 am moving forward. SLP will inform scheduling. Pt completed written deductive reasoning task with mod A for processing written information specifically when given written  multi step instructions. She demonstrated awareness that her abiltiy to read and comprehend information is more difficult since hospitalization. She required increased time and visual cues to increase use of process of elimination when determining solutions. Cont with therapy per plan ofcare.       Pain Pain Assessment Pain Scale: Faces Pain Score: 2  Faces Pain Scale: No hurt Pain Type: Acute pain Pain Location: Foot Pain Orientation: Right;Left Pain Descriptors / Indicators: Aching Patients Stated Pain Goal: 2 Pain Intervention(s): Medication (See eMAR)  Therapy/Group: Individual Therapy  Darrol Poke Caelyn Route 12/02/2020, 8:12 AM

## 2020-12-02 NOTE — Progress Notes (Signed)
Physical Therapy Session Note  Patient Details  Name: [name] [name] MRN: [id number] Date of Birth: 01-May-1961  Today's Date: 12/02/2020 PT Individual Time: 1100-1200 PT Individual Time Calculation (min): 60 min   Short Term Goals: Week 1:  PT Short Term Goal 1 (Week 1): Pt will perform bed mobility with mod assist PT Short Term Goal 2 (Week 1): Pt will perform SB transfer with max A +2 PT Short Term Goal 3 (Week 1): Pt will tolerate sitting in WC >2 hours between therapies PT Short Term Goal 4 (Week 1): Pt will perform sit>stand with +2 assist Week 2:     Skilled Therapeutic Interventions/Progress Updates:    pt received in PWC and agreeable to therapy. Pt directed in seated BLE strengthening exercises in dependent position 2x10 marching, LAQ, hip abduction/adduction. 2nd helper then present to assist in transfer to bed, pt PWC setup for slide board transfer total A for safety and then max A for slide board transfer to bed. Pt directed in supine UE strengthening with orange therabands 2x10 bicep and tricep curls and chest press. Pt also lead in passive ROM for ankle DF 3x45s. Pt left in bed, All needs in reach and in good condition. Call light in hand.  Pt required extra time throughout session 2/2 rest breaks and to decrease anxiety about transfers with slide board. Pt also educated on importance of progression with transfers from mechanical lift for increased I and decreased burden of care for home. Pt verbalized understanding.   Therapy Documentation Precautions:  Precautions Precautions: Fall Precaution Comments: Gangrenous changes in hands Rt>Lt, open foot wounds bilaterally Restrictions Weight Bearing Restrictions: Yes RLE Weight Bearing: Non weight bearing LLE Weight Bearing: Non weight bearing Other Position/Activity Restrictions: WBAT B LEs while wearing compression socks + Darco shoes General:   Vital Signs: Therapy Vitals Temp: 98.6 F (37 C) Pulse Rate: 71 Resp:  16 BP: 98/77 Patient Position (if appropriate): Sitting Oxygen Therapy SpO2: 97 % O2 Device: Room Air Pain: Pain Assessment Pain Scale: 0-10 Pain Score: 7  Pain Type: Acute pain Pain Location: Foot Pain Orientation: Right;Left Pain Descriptors / Indicators: Aching Pain Frequency: Constant Pain Onset: Gradual Patients Stated Pain Goal: 2 Pain Intervention(s): Medication (See eMAR) Mobility:   Locomotion :    Trunk/Postural Assessment :    Balance:   Exercises:   Other Treatments:      Therapy/Group: Individual Therapy  Barbaraann Faster 12/02/2020, 3:16 PM

## 2020-12-02 NOTE — Progress Notes (Signed)
Occupational Therapy Session Note  Patient Details  Name: [name] [name] MRN: [id number] Date of Birth: [birth year]  Today's Date: 12/02/2020 OT Individual Time: 7096-2836 OT Individual Time Calculation (min): 58 min    Short Term Goals: Week 1:  OT Short Term Goal 1 (Week 1): Pt will sit EOB for ~5 minutes while engaged in an ADL task to increase upright tolerance OT Short Term Goal 2 (Week 1): Pt will complete LB dressing at sit<stand level with LRAD OT Short Term Goal 3 (Week 1): Pt don an overhead shirt with no more than Mod A using adaptive strategies as needed   Skilled Therapeutic Interventions/Progress Updates:    Pt greeted at time of session supine in bed resting agreeable to OT session, needing to use urinal at beginning of session with Max A set up for towel underneath and placement of urinal. Bringing HOB up for pt to reach periarea from bed level for hygiene. Donned pants bed level two helpers with rolling L/R to don over hips. Mod A for supine > sit and slide board 2 helpers bed > power chair. Donned darco shoes prior in case pt did weight bear but did not during this transfer. Once in chair, pt briefly emotional and support provided. Pt partially driving chair to sink level, therapist assisting as well for turn. Pt brush teeth and grooming with Set up/Supervision sink level. Pt up in power chair with call bell in reach and positioned for comfort. Note frequent verbal cues throughout session for pacing, emotionally stabilizing, and answering questions multiple times d/t anxiety.   Therapy Documentation Precautions:  Precautions Precautions: Fall Precaution Comments: Gangrenous changes in hands Rt>Lt, open foot wounds bilaterally Restrictions Weight Bearing Restrictions: Yes RLE Weight Bearing: Non weight bearing LLE Weight Bearing: Non weight bearing Other Position/Activity Restrictions: WBAT B LEs while wearing compression socks + Darco shoes    Therapy/Group:  Individual Therapy  Erasmo Score 12/02/2020, 7:21 AM

## 2020-12-03 DIAGNOSIS — R5381 Other malaise: Secondary | ICD-10-CM | POA: Diagnosis not present

## 2020-12-03 MED ORDER — GABAPENTIN 300 MG PO CAPS
600.0000 mg | ORAL_CAPSULE | Freq: Four times a day (QID) | ORAL | Status: DC
  Administered 2020-12-03 – 2020-12-23 (×77): 600 mg via ORAL
  Filled 2020-12-03 (×77): qty 2

## 2020-12-03 MED ORDER — BUSPIRONE HCL 5 MG PO TABS
5.0000 mg | ORAL_TABLET | Freq: Three times a day (TID) | ORAL | Status: DC
  Administered 2020-12-03 – 2020-12-18 (×47): 5 mg via ORAL
  Filled 2020-12-03 (×49): qty 1

## 2020-12-03 MED ORDER — DULOXETINE HCL 30 MG PO CPEP
30.0000 mg | ORAL_CAPSULE | Freq: Every day | ORAL | Status: DC
  Administered 2020-12-03 – 2020-12-08 (×6): 30 mg via ORAL
  Filled 2020-12-03 (×6): qty 1

## 2020-12-03 NOTE — Patient Care Conference (Signed)
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 Genice Rouge Care Coodinator Present: Lavera Guise, BSW;Vestal Markin Marlyne Beards, RN, BSN, CRRN Nurse Present: Kennyth Arnold, RN PT Present: Otelia Sergeant, PT OT Present: Earleen Newport, OT SLP Present: Colin Benton, SLP PPS Coordinator present : Fae Pippin, SLP     Current Status/Progress Goal Weekly Team Focus  Bowel/Bladder   continent x2  will remain continent  will assess prn   Swallow/Nutrition/ Hydration             ADL's   limited OOB recently, slide board transfer 2 assist vs Maxi, LB dress/bathe bed level, toileting using urinal and bed pan, rolling with Min A, oral hygiene/grooming with Min A with techniques  Min A LB/toileting, Min transfers  sitting balance/core strength, emotional coping, general conditioning/endurance, ADL retraining, DC planning, transfers   Mobility             Communication   Min A higher level word finding  Mod I  higher level word finding and strategies in conversation   Safety/Cognition/ Behavioral Observations  Min A complex problem solving, min-supervision A increase recall and higher level attention  Mod I  alertnating attention, complex problem  solving and recall   Pain   Pain tolerable and managed with meds  less than 3  will continue to assess pain qshift   Skin   Necrotic extremities. Fingers and toes.  Free from infection  assess qshift and provide care as needed with zinc oitment     Discharge Planning:  Discharging gome with spouse and sister to provide care 24/7   Team Discussion: Needs resting hand splint for left hand, there is one in the room and it needs to be put on every night for at least 6 hours. Adding Duloxetine QHS, increasing Gabapentin. Patient is getting PIPs and DLPs. Continent B/B, pain to bilateral feet relieved by Oxycodone and Gabapentin. Ordered placed for Chaplain to come see patient. Gangrene to both hands and feet on fingers and toes. Patient on target to meet rehab goals: Limited OOB tolerance, slide board transfers Max assist +2, bed level ADL's. If slide boards don't improve, potential for hoyer lift. Experienced nausea this morning, min/mod assist for bed mobility, mod/max +2 for slide board. Does not want power chair at home. SLP reports min assist to supervision for cognition and language.  *See Care Plan and progress notes for long and short-term goals.   Revisions to Treatment Plan:  MD adjusting medications.  Teaching Needs: Family education, medication management, pain management, N/V management, skin/wound care, transfer training, gait training, balance training, endurance training, weight bearing precautions, safety awareness.  Current Barriers to Discharge: Decreased caregiver support, Medical stability, Home enviroment access/layout,  Wound care, Lack of/limited family support, Weight, Weight bearing restrictions, Medication compliance and Behavior  Possible Resolutions to Barriers: Continue current medications, provide emotional support.     Medical Summary Current Status: getting worsening contractures on L hand, PIPs and DIps; continent B/B; bowels working well; taking oxy prn and  gabapentin QIDgangrene to fingers/toes and feet B/L  Barriers to Discharge: Decreased family/caregiver support;Home enviroment access/layout;Medical stability;Wound care;Weight bearing restrictions;Weight  Barriers to Discharge Comments: depression playing a role in working with therapy- sliding board transfers cries every time' talking about hoyer lift if SB transfers don't improve. Possible Resolutions to [employer]: need nursing to put on LUE splint at night and PRAFOs; added duloxetine 30 mg QHS for mood/nerve pain; and increased gabapentin to 600 mg QID; needs to progress /anxiety to get into manual w/c. needs to say neuropsych.  d/c 6/13 Monday   Continued Need for Acute Rehabilitation Level of Care: The patient requires daily medical management by a physician with specialized training in physical medicine and rehabilitation for the following reasons: Direction of a multidisciplinary physical rehabilitation program to maximize functional independence : Yes Medical management of patient stability for increased activity during participation in an intensive rehabilitation regime.: Yes Analysis of laboratory values and/or radiology reports with any subsequent need for medication adjustment and/or medical intervention. : Yes   I attest that I was present, lead the team conference, and concur with the assessment and plan of the team.   Tennis Must 12/03/2020, 5:21 PM

## 2020-12-03 NOTE — Progress Notes (Signed)
Physical Therapy Session Note  Patient Details  Name: [name] [name] MRN: [id number] Date of Birth: 1960-11-30  Today's Date: 12/03/2020 PT Individual Time: 1500-1530 PT Individual Time Calculation (min): 30 min   Short Term Goals: Week 2:  PT Short Term Goal 1 (Week 2): pt to demonstrate STS with LRAD max A x1 PT Short Term Goal 2 (Week 2): pt to demonstrate dynamic sitting balance at supervision PT Short Term Goal 3 (Week 2): pt to initiate static standing at max A  Skilled Therapeutic Interventions/Progress Updates: pt received in bed and agreeable to therapy. Pt directed in donning shorts max A at bed level. Pt directed in supine>sit min A. Pt directed in 4x5 anterior/posterior weight shifts at stedy to promote lightly increased tolerance to weight bearing to promote increased ability to attempt standing transfers. Pt directed in technique and reasoning with this which pt understood and agreeable to. DRACO boots donned in bed total A. Pt required rest breaks post each set 2/2 fatigue. Pt then requested to return to bed, min A for BLE onto bed, max A x2 for upward scooting and positioning in bed, doffed shorts max A. Pt left in bed, All needs in reach and in good condition. Call light in hand.       Therapy Documentation Precautions:  Precautions Precautions: Fall Precaution Comments: Gangrenous changes in hands Rt>Lt, open foot wounds bilaterally Restrictions Weight Bearing Restrictions: Yes RLE Weight Bearing: Non weight bearing LLE Weight Bearing: Non weight bearing Other Position/Activity Restrictions: WBAT B LEs while wearing compression socks + Darco shoes General:   Vital Signs: Therapy Vitals BP: (!) 107/53 Pain: Pain Assessment Pain Scale: 0-10 Pain Score: 10-Worst pain ever Pain Type: Acute pain Pain Location: Foot Pain Orientation: Right;Left Pain Descriptors / Indicators: Aching Pain Frequency: Intermittent Pain Onset: Gradual Patients Stated Pain Goal:  2 Pain Intervention(s): Medication (See eMAR) Mobility:   Locomotion :    Trunk/Postural Assessment :    Balance:   Exercises:   Other Treatments:      Therapy/Group: Individual Therapy  Barbaraann Faster 12/03/2020, 3:45 PM

## 2020-12-03 NOTE — Progress Notes (Signed)
Occupational Therapy Session Note  Patient Details  Name: [name] [name] MRN: [id number] Date of Birth: 17-Dec-1960  Today's Date: 12/03/2020 OT Individual Time: 1000-1056 and 1416-1443 OT Individual Time Calculation (min): 56 min and 27 min   Short Term Goals: Week 1:  OT Short Term Goal 1 (Week 1): Pt will sit EOB for ~5 minutes while engaged in an ADL task to increase upright tolerance OT Short Term Goal 1 - Progress (Week 1): Met OT Short Term Goal 2 (Week 1): Pt will complete LB dressing at sit<stand level with LRAD OT Short Term Goal 2 - Progress (Week 1): Not met OT Short Term Goal 3 (Week 1): Pt don an overhead shirt with no more than Mod A using adaptive strategies as needed OT Short Term Goal 3 - Progress (Week 1): Met Week 2:  OT Short Term Goal 1 (Week 2): Pt will complete slide board transfer with 1 helper in prep for ADL OOB OT Short Term Goal 2 (Week 2): Pt will complete UB dressing no more than Min A OT Short Term Goal 3 (Week 2): Pt will utilize emotional coping strategies with no more than Min cues consistently to increase participation OT Short Term Goal 4 (Week 2): Pt will perform LB dressing with Max A of 1   Skilled Therapeutic Interventions/Progress Updates:    Session 1: Pt greeted at time of session sitting up in power chair needing to toilet. Therapist assist with driving chair to EOB, slide board chair > bed with Max of 2 with total A for placement of board having pt lean to L and therapist assist to lift RLE. Pt very anxious throughout transfer and emotional. Once EOB, sit > supine Max A for LE management. Repositioned in bed max A and second helper for scooting to [facility]. Doffed/donned shorts bed level with rolling L/R Max A. Dependent placement of urinal for pt to void - becoming slightly upset about "spillage" but noted to have minimal to no spillage when finished voiding. Dependent for hygiene. Remainder of session focused on ROM for L hand digits 3-5 at PIP with  mild joint mobs and promoting extension with static hold, some visible difference noted at end of session with improved extension. Confirmed fit of universal resting hand splint in room - can be used to promote PIP extension. Pt in bed call bell in reach all needs met.   Session 2: Pt greeted at time of session supine in bed resting agreeable to OT session, wanting to focus on LUE developing contractures given short session time. Created and provided HEP for the pt for PROM for both hands, focusing on L hand digits 3-5 for modified prayer stretch and single digit and composite PIP stretches. Demonstrated on pt and pt demonstrating carryover with Mod cues. Will continue to practice for max carryover. Provided hand out. Call bell in reach all needs met.   Therapy Documentation Precautions:  Precautions Precautions: Fall Precaution Comments: Gangrenous changes in hands Rt>Lt, open foot wounds bilaterally Restrictions Weight Bearing Restrictions: Yes RLE Weight Bearing: Non weight bearing LLE Weight Bearing: Non weight bearing Other Position/Activity Restrictions: WBAT B LEs while wearing compression socks + Darco shoes    Therapy/Group: Individual Therapy  [name] 12/03/2020, 7:23 AM

## 2020-12-03 NOTE — Progress Notes (Signed)
This chaplain responded to consult for Pt. prayer.  The Pt. is awake and open to a chaplain visit after a day of therapy.    The chaplain listened as Pt. identified her emotion associated with the time away from her family.  The Pt. described the experience as a "break" in family  life.  The chaplain understands the Pt. goal is to gain strength and participate in her grand daughters high school graduation celebration.    The Pt. remains hopeful for healing, open to intercessory prayer and F/U spiritual care.

## 2020-12-03 NOTE — Progress Notes (Signed)
Physical Therapy Weekly Progress Note  Patient Details  Name: [name] [name] MRN: [id number] Date of Birth: 1960/08/15  Beginning of progress report period: Nov 23, 2020 End of progress report period: Dec 03, 2020  Today's Date: 12/03/2020 PT Individual Time: 0800-0900 PT Individual Time Calculation (min): 60 min   Patient has met 3 of 4 short term goals.  Pt is currently mod A for bed mobility for support with trunk into sitting and BLE management into supine. Pt currently max A-mod A x2 for slide board transfers though limited 2/2 pain with weight bearing in BLE and BUE. Pt also has been tolerating increased time in [facility] with education on positioning and ability to reposition. Pt continues to require assistance with all functional mobility, unable to attempt gait training currently and continues to require high levels of assistance for transfers.   Patient continues to demonstrate the following deficits muscle weakness, decreased cardiorespiratoy endurance and decreased sitting balance, decreased standing balance, decreased postural control and decreased balance strategies and therefore will continue to benefit from skilled PT intervention to increase functional independence with mobility.  Patient progressing toward long term goals..  Continue plan of care.  PT Short Term Goals Week 1:  PT Short Term Goal 1 (Week 1): Pt will perform bed mobility with mod assist PT Short Term Goal 1 - Progress (Week 1): Met PT Short Term Goal 2 (Week 1): Pt will perform SB transfer with max A +2 PT Short Term Goal 2 - Progress (Week 1): Met PT Short Term Goal 3 (Week 1): Pt will tolerate sitting in WC >2 hours between therapies PT Short Term Goal 3 - Progress (Week 1): Met PT Short Term Goal 4 (Week 1): Pt will perform sit>stand with +2 assist PT Short Term Goal 4 - Progress (Week 1): Not met Week 2:  PT Short Term Goal 1 (Week 2): pt to demonstrate STS with LRAD max A x1 PT Short Term Goal 2 (Week 2):  pt to demonstrate dynamic sitting balance at supervision PT Short Term Goal 3 (Week 2): pt to initiate static standing at max A  Skilled Therapeutic Interventions/Progress Updates:    pt received in bed and agreeable to therapy. Pt directed in donning shorts max A in bed with min A for rolling R and L. Pt directed in donning B wound care boots max A at bed. Pt then directed in supine>sit mod A for trunk support into sitting. Pt directed in static sitting for ~5 mins at [facility] for setup for PWC and slide board and max-mod  A x2 for slide board transfer to [facility]. Once in [facility] pt required max A for repositioning. Pt then reported greatly increased nausea and needed emesis bag with heaving noted but no vomiting. With extra time and cool cloth, nursing came for nausea medication, pt recovered and agreed to leave room. Pt directed in [place] mobility, min A for narrow spaces and pt demonstrates increased anxiety with adjustments for directional changes and corrections. Pt returned to room and required mod A for navigation to mobilize [place] into small area in room. Pt left in [place], All needs in reach and in good condition. Call light in hand.    Therapy Documentation Precautions:  Precautions Precautions: Fall Precaution Comments: Gangrenous changes in hands Rt>Lt, open foot wounds bilaterally Restrictions Weight Bearing Restrictions: Yes RLE Weight Bearing: Non weight bearing LLE Weight Bearing: Non weight bearing Other Position/Activity Restrictions: WBAT B LEs while wearing compression socks + Darco shoes General:   Vital  Signs:  Pain: Pain Assessment Pain Scale: 0-10 Pain Score: 0-No pain Vision/Perception     Mobility:   Locomotion :    Trunk/Postural Assessment :    Balance:   Exercises:   Other Treatments:     Therapy/Group: Individual Therapy  Junie Panning 12/03/2020, 12:10 PM

## 2020-12-03 NOTE — Progress Notes (Signed)
Patient ID: [name] [name], female   DOB: 18-Dec-1960, 60 y.o.   MRN: [id number] Team Conference Report to Patient/Family  Team Conference discussion was reviewed with the patient and caregiver, including goals, any changes in plan of care and target discharge date.  Patient and caregiver express understanding and are in agreement.  The patient has a target discharge date of 12/23/20.  Sw met with pt provided conference updates. Provided pt with upcoming d/c date. SW will call pt husband to provide this info. No additional questions or concerns  [name] 12/03/2020, 1:53 PM

## 2020-12-03 NOTE — Progress Notes (Addendum)
PROGRESS NOTE   Subjective/Complaints:  Pt reports lidocaine hasn't helped her hand pain- she describes it as numb/tingling and almost burning-  Taking pain meds q6 hours- pretty much around the clock per pt.  Bowels and bladder working well.   ROS:   Pt denies SOB, abd pain, CP, N/V/C/D, and vision changes   Objective:   No results found. Recent Labs    12/02/20 0644  WBC 6.7  HGB 11.0*  HCT 35.7*  PLT 433*   Recent Labs    12/02/20 0644  NA 137  K 3.6  CL 102  CO2 29  GLUCOSE 100*  BUN <5*  CREATININE 0.40*  CALCIUM 9.2    Intake/Output Summary (Last 24 hours) at 12/03/2020 8270 Last data filed at 12/03/2020 0700 Gross per 24 hour  Intake 261 ml  Output 175 ml  Net 86 ml        Physical Exam: Vital Signs Blood pressure (!) 102/43, pulse 70, temperature 98.5 F (36.9 C), temperature source Oral, resp. rate 18, height 5\' 4"  (1.626 m), weight 83.5 kg, last menstrual period 11/22/2020, SpO2 96 %.     General: awake, alert, appropriate, sitting up in bed; NAD HENT: conjugate gaze; oropharynx moist CV: regular rate; no JVD Pulmonary: CTA B/L; no W/R/R- good air movement GI: soft, NT, ND, (+)BS Psychiatric: appropriate- brighter this AM Neurological: alert- STM issues- stable Skin: Warm and dry.   Dry gangrene distal digits- losing more range of motion in 2nd-5th digits at PIPs on L, not on R Scattered areas of eschar Bilateral hands with blanchable erythema on palms-no change; still more TTP- esp on fingers below gangrene Musc: No edema in extremities.  No tenderness in extremities. Motor:  Motor: B/l UE: Shoulder abduction, elbow flexion/extention 4/5, hand limited to due pain, stable.  RLE: HF, KE 4/5, ADF 0/5 (some pain inhibition) LLE: HF, KE 4/5, ADF 2-/5 (some pain inhibition)   Assessment/Plan: 1. Functional deficits which require 3+ hours per day of interdisciplinary therapy in a  comprehensive inpatient rehab setting.  Physiatrist is providing close team supervision and 24 hour management of active medical problems listed below.  Physiatrist and rehab team continue to assess barriers to discharge/monitor patient progress toward functional and medical goals  Care Tool:  Bathing    Body parts bathed by patient: Right arm,Left arm,Chest,Abdomen,Face   Body parts bathed by helper: Right lower leg,Left lower leg,Left upper leg,Right upper leg     Bathing assist Assist Level: Moderate Assistance - Patient 50 - 74% (declined LB bathing buttocks/periarea)     Upper Body Dressing/Undressing Upper body dressing   What is the patient wearing?: Pull over shirt    Upper body assist Assist Level: Moderate Assistance - Patient 50 - 74%    Lower Body Dressing/Undressing Lower body dressing      What is the patient wearing?: Pants     Lower body assist Assist for lower body dressing: 2 Helpers     Toileting Toileting    Toileting assist Assist for toileting: Dependent - Patient 0%     Transfers Chair/bed transfer  Transfers assist     Chair/bed transfer assist level: Total Assistance - Patient <  25% (slide board)     Locomotion Ambulation   Ambulation assist   Ambulation activity did not occur: Safety/medical concerns          Walk 10 feet activity   Assist  Walk 10 feet activity did not occur: Safety/medical concerns        Walk 50 feet activity   Assist Walk 50 feet with 2 turns activity did not occur: Safety/medical concerns         Walk 150 feet activity   Assist Walk 150 feet activity did not occur: Safety/medical concerns         Walk 10 feet on uneven surface  activity   Assist Walk 10 feet on uneven surfaces activity did not occur: Safety/medical concerns         Wheelchair     Assist Will patient use wheelchair at discharge?: Yes Type of Wheelchair: Power    Wheelchair assist level:  Supervision/Verbal cueing Max wheelchair distance: 300'    Wheelchair 50 feet with 2 turns activity    Assist        Assist Level: Supervision/Verbal cueing   Wheelchair 150 feet activity     Assist      Assist Level: Supervision/Verbal cueing   Blood pressure (!) 102/43, pulse 70, temperature 98.5 F (36.9 C), temperature source Oral, resp. rate 18, height 5\' 4"  (1.626 m), weight 83.5 kg, last menstrual period 11/22/2020, SpO2 96 %.   Medical Problem List and Plan: 1.  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  PE/Antithrombotics: -DVT/anticoagulation:  Pharmaceutical: Other (comment)--Eliquis             -antiplatelet therapy: N/A 3. Pain Management: Oxycodone 5mg  q 6 h prn, also  Gabapentin 300mg  BID - increase to TID  Increased gabapentin to 400 mg QID   5/24- will increase Gabapentin to 600 mg QID; will also add Duloxetine 30 mg QHS for pain- if tolerates, will increase to 60 mg and STOP Lexapro at that time             Monitor with increased exertion 4. Mood: LCSW to follow for evaluation and support. Team to offer ego support.   Increased Lexapro to 20 mg daily on 5/16  5/24- as above, will stop lexapro once increase duloxetine, if tolerates- will also add Buspar for anxiety 5 mg TID.              -antipsychotic agents: N/A 5. Neuropsych: This patient is capable of making decisions on her own behalf. 6. Skin/Wound Care:              Prevalon boots for use when in bed. Continue left hand splint.              --Darco shoe for weight bearing per PT input.   5/18- approved to do silvadene and wound care for feet as well as PRAFOs for ankles- to wear at night.  5/23- pt likes to wear PRAFOs- explained can wear them any time not in therapy  5/24- asked PT to have OT make L hand splint to help formation of contractures of L hand. Will reinforce to OT when see her.  7.  Fluids/Electrolytes/Nutrition: Monitor  I/Os             Added protein  supplements to help promote wound healing.  8.  PAF: Monitor HR tid--continue Metoprolol and Cardizem for rate control.  Vitals:   12/02/20 2050 12/03/20 0345  BP: 109/75 (!) 102/43  Pulse: 75 70  Resp: 16 18  Temp: 98.3 F (36.8 C) 98.5 F (36.9 C)  SpO2: 97% 96%   Rate controlled on 5/22 9. Dysphagia: Continue aspiration precautions--No straws.              Advance diet as tolerated  5/23- SLP still seeing- con't regimen  Intermittent supervision 10. ABLA              Hemoglobin 11.3 on 5/16, labs ordered for tomorrow  5/24- Hb 11.0- con't regimen  11. Forming contractures- DIPs B/L and ankles  Continue PRAFOs nightly also will try and get some "arthritis gloves" for hands to help with forming DIP contractures?  5/23-  con't PRAFOs  5/24- will get resting hand splint made for L hand due to worsening contractures.  12. Loose stools  Improved  LOS: 11 days A FACE TO FACE EVALUATION WAS PERFORMED  Josephmichael Lisenbee 12/03/2020, 9:23 AM

## 2020-12-04 DIAGNOSIS — R5381 Other malaise: Secondary | ICD-10-CM | POA: Diagnosis not present

## 2020-12-04 NOTE — Progress Notes (Signed)
PROGRESS NOTE   Subjective/Complaints:  No side effects from Buspar or Duloxetine-  Slept well Wore L hand brace overnight- feels more movement in LUE/hand.  Notes that when legs dangle, legs /feet bleed more.    ROS:   Pt denies SOB, abd pain, CP, N/V/C/D, and vision changes   Objective:   No results found. Recent Labs    12/02/20 0644  WBC 6.7  HGB 11.0*  HCT 35.7*  PLT 433*   Recent Labs    12/02/20 0644  NA 137  K 3.6  CL 102  CO2 29  GLUCOSE 100*  BUN <5*  CREATININE 0.40*  CALCIUM 9.2    Intake/Output Summary (Last 24 hours) at 12/04/2020 1915 Last data filed at 12/04/2020 1801 Gross per 24 hour  Intake 660 ml  Output 1000 ml  Net -340 ml        Physical Exam: Vital Signs Blood pressure (!) 107/58, pulse 72, temperature 98.4 F (36.9 C), temperature source Oral, resp. rate 16, height 5\' 4"  (1.626 m), weight 83.9 kg, last menstrual period 11/22/2020, SpO2 97 %.      General: awake, alert, appropriate, NAD HENT: conjugate gaze; oropharynx moist CV: regular rate; no JVD Pulmonary: CTA B/L; no W/R/R- good air movement GI: soft, NT, ND, (+)BS Psychiatric: appropriate Neurological: alert- STM issues Skin: Warm and dry.   Dry gangrene distal digits- losing more range of motion in 2nd-5th digits at PIPs on L, not on R- looks slightly better this AM- also gangrene on L fingers MUCH improved last few days- Feet are wrapped with silvadene- goopy with some bleeding mainly on R first toe.  Scattered areas of eschar Bilateral hands with blanchable erythema on palms-no change; still more TTP- esp on fingers below gangrene Musc: No edema in extremities.  No tenderness in extremities. Motor:  Motor: B/l UE: Shoulder abduction, elbow flexion/extention 4/5, hand limited to due pain, stable.  RLE: HF, KE 4/5, ADF 0/5 (some pain inhibition) LLE: HF, KE 4/5, ADF 2-/5 (some pain inhibition)    Assessment/Plan: 1. Functional deficits which require 3+ hours per day of interdisciplinary therapy in a comprehensive inpatient rehab setting.  Physiatrist is providing close team supervision and 24 hour management of active medical problems listed below.  Physiatrist and rehab team continue to assess barriers to discharge/monitor patient progress toward functional and medical goals  Care Tool:  Bathing    Body parts bathed by patient: Right arm,Left arm,Chest,Abdomen,Face,Right upper leg,Left upper leg   Body parts bathed by helper: Front perineal area,Buttocks,Right lower leg,Left lower leg     Bathing assist Assist Level: Moderate Assistance - Patient 50 - 74%     Upper Body Dressing/Undressing Upper body dressing   What is the patient wearing?: Pull over shirt    Upper body assist Assist Level: Moderate Assistance - Patient 50 - 74%    Lower Body Dressing/Undressing Lower body dressing      What is the patient wearing?: Pants     Lower body assist Assist for lower body dressing: Total Assistance - Patient < 25%     Toileting Toileting    Toileting assist Assist for toileting: Dependent - Patient 0%  Transfers Chair/bed transfer  Transfers assist     Chair/bed transfer assist level: Total Assistance - Patient < 25% (slide board)     Locomotion Ambulation   Ambulation assist   Ambulation activity did not occur: Safety/medical concerns          Walk 10 feet activity   Assist  Walk 10 feet activity did not occur: Safety/medical concerns        Walk 50 feet activity   Assist Walk 50 feet with 2 turns activity did not occur: Safety/medical concerns         Walk 150 feet activity   Assist Walk 150 feet activity did not occur: Safety/medical concerns         Walk 10 feet on uneven surface  activity   Assist Walk 10 feet on uneven surfaces activity did not occur: Safety/medical concerns          Wheelchair     Assist Will patient use wheelchair at discharge?: Yes Type of Wheelchair: Power    Wheelchair assist level: Supervision/Verbal cueing Max wheelchair distance: 300'    Wheelchair 50 feet with 2 turns activity    Assist        Assist Level: Supervision/Verbal cueing   Wheelchair 150 feet activity     Assist      Assist Level: Supervision/Verbal cueing   Blood pressure (!) 107/58, pulse 72, temperature 98.4 F (36.9 C), temperature source Oral, resp. rate 16, height 5\' 4"  (1.626 m), weight 83.9 kg, last menstrual period 11/22/2020, SpO2 97 %.   Medical Problem List and Plan: 1.  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  PE/Antithrombotics: -DVT/anticoagulation:  Pharmaceutical: Other (comment)--Eliquis             -antiplatelet therapy: N/A 3. Pain Management: Oxycodone 5mg  q 6 h prn, also  Gabapentin 300mg  BID - increase to TID  Increased gabapentin to 400 mg QID   5/24- will increase Gabapentin to 600 mg QID; will also add Duloxetine 30 mg QHS for pain- if tolerates, will increase to 60 mg and STOP Lexapro at that time  5/25- no side effects- con't regimen             Monitor with increased exertion 4. Mood: LCSW to follow for evaluation and support. Team to offer ego support.   Increased Lexapro to 20 mg daily on 5/16  5/24- as above, will stop lexapro once increase duloxetine, if tolerates- will also add Buspar for anxiety 5 mg TID.   5/25- no side effects- con't regimen- no results so far             -antipsychotic agents: N/A 5. Neuropsych: This patient is capable of making decisions on her own behalf. 6. Skin/Wound Care:              Prevalon boots for use when in bed. Continue left hand splint.              --Darco shoe for weight bearing per PT input.   5/18- approved to do silvadene and wound care for feet as  well as PRAFOs for ankles- to wear at night.  5/23- pt likes to wear PRAFOs- explained can wear them any time not in therapy  5/24- asked PT to have OT make L hand splint to help formation of contractures of L hand. Will reinforce to OT when  see her.   5/25- has L hand splint/brace- wore last night- con't regimen 7. Fluids/Electrolytes/Nutrition: Monitor  I/Os             Added protein  supplements to help promote wound healing.  8.  PAF: Monitor HR tid--continue Metoprolol and Cardizem for rate control.             Vitals:   12/04/20 0447 12/04/20 1319  BP: (!) 99/52 (!) 107/58  Pulse: 75 72  Resp: 16 16  Temp: 98 F (36.7 C) 98.4 F (36.9 C)  SpO2: 94% 97%   5/25- rate controlled- BP soft, but no hypotension- con't regimen and to monitor 9. Dysphagia: Continue aspiration precautions--No straws.              Advance diet as tolerated  5/23- SLP still seeing- con't regimen  Intermittent supervision 10. ABLA              Hemoglobin 11.3 on 5/16, labs ordered for tomorrow  5/24- Hb 11.0- con't regimen  11. Forming contractures- DIPs B/L and ankles  Continue PRAFOs nightly also will try and get some "arthritis gloves" for hands to help with forming DIP contractures?  5/23-  con't PRAFOs  5/24- will get resting hand splint made for L hand due to worsening contractures.  12. Loose stools  Improved  LOS: 12 days A FACE TO FACE EVALUATION WAS PERFORMED  Naim Murtha 12/04/2020, 7:15 PM

## 2020-12-04 NOTE — Progress Notes (Signed)
Physical Therapy Session Note  Patient Details  Name: [name] [name] MRN: [id number] Date of Birth: 01/08/1961  Today's Date: 12/04/2020 PT Individual Time: 0830-0900 PT Individual Time Calculation (min): 30 min   Short Term Goals: Week 2:  PT Short Term Goal 1 (Week 2): pt to demonstrate STS with LRAD max A x1 PT Short Term Goal 2 (Week 2): pt to demonstrate dynamic sitting balance at supervision PT Short Term Goal 3 (Week 2): pt to initiate static standing at max A  Skilled Therapeutic Interventions/Progress Updates:    Pt received seated in bed, agreeable to PT session. No complaints of pain. MD in room for morning rounds and requesting use of putty for finger strengthening and extension of joints. Provided level yellow putty and reviewed rolling out putty like a snake for finger extension and then performing pinching grasp with thumb to each digit. Pt then able to roll putty into a ball and repeat sequence of rolling out into a snake, pinching, etc. Pt exhibits decreased strength in LUE as compared to RUE. Pt left seated in bed with needs in reach at end of session.  Therapy Documentation Precautions:  Precautions Precautions: Fall Precaution Comments: Gangrenous changes in hands Rt>Lt, open foot wounds bilaterally Restrictions Weight Bearing Restrictions: Yes RLE Weight Bearing: Non weight bearing LLE Weight Bearing: Non weight bearing Other Position/Activity Restrictions: WBAT B LEs while wearing compression socks + Darco shoes   Therapy/Group: Individual Therapy   Peter Congo, PT, DPT, CSRS  12/04/2020, 12:17 PM

## 2020-12-04 NOTE — Progress Notes (Signed)
Occupational Therapy Session Note  Patient Details  Name: [name] [name] MRN: [id number] Date of Birth: 18-Jan-1961  Today's Date: 12/04/2020 OT Individual Time: 7195-9747 and 1855-0158 OT Individual Time Calculation (min): 55 min and 26 min   Short Term Goals: Week 1:  OT Short Term Goal 1 (Week 1): Pt will sit EOB for ~5 minutes while engaged in an ADL task to increase upright tolerance OT Short Term Goal 1 - Progress (Week 1): Met OT Short Term Goal 2 (Week 1): Pt will complete LB dressing at sit<stand level with LRAD OT Short Term Goal 2 - Progress (Week 1): Not met OT Short Term Goal 3 (Week 1): Pt don an overhead shirt with no more than Mod A using adaptive strategies as needed OT Short Term Goal 3 - Progress (Week 1): Met Week 2:  OT Short Term Goal 1 (Week 2): Pt will complete slide board transfer with 1 helper in prep for ADL OOB OT Short Term Goal 2 (Week 2): Pt will complete UB dressing no more than Min A OT Short Term Goal 3 (Week 2): Pt will utilize emotional coping strategies with no more than Min cues consistently to increase participation OT Short Term Goal 4 (Week 2): Pt will perform LB dressing with Max A of 1   Skilled Therapeutic Interventions/Progress Updates:    Session 1: Pt greeted at time of session supine in bed resting agreeable to OT session but states she does not like them this early, however later saying in session she did not want to request later sessions. Agreeable to ADL, supine > sit EOB Min/CGA with extended time and Mod A for positioning EOB. Pt sat EOB and performed UB bathing with mit with Min A for UB for thoroughness and Mod A for LB. Sat EOB approx 40 minutes throughout session with none or unilateral support. Donned shirt Mod A and pants bed level rolling L/R with max/total A and Mod A for rolling. Oral hygiene sitting EOB with Supervision. Note focused on adaptive techniques throughout session including mit for bathing, opening toothepaste, using  thumb for donning/doffing clothing, and global endurance for sitting EOB. Reviewed HEP as well for LUE contracture stretches at digits 3-5. Call bell in reach all needs met.    Session 2: Pt greeted at time of session supine in bed resting with NT preparing to place bed pan, therapist to assist. Pt rolling L with Mod A and therapist assist doffing clothing, placement of bed pan and hygiene dependently. Pt given a few minutes of privacy for BM, and returned approx 5 minutes later, needing to finish having BM. Returned 10 mins later and pt finished. Rolling L again in same manner and therapist assist doffing clothing as this is last session of the day while pt assisting with lifting BLEs for easier removal. Pt resting in bed call bell in reach all needs met.    Therapy Documentation Precautions:  Precautions Precautions: Fall Precaution Comments: Gangrenous changes in hands Rt>Lt, open foot wounds bilaterally Restrictions Weight Bearing Restrictions: Yes RLE Weight Bearing: Non weight bearing LLE Weight Bearing: Non weight bearing Other Position/Activity Restrictions: WBAT B LEs while wearing compression socks + Darco shoes    Therapy/Group: Individual Therapy  [name] 12/04/2020, 7:07 AM

## 2020-12-04 NOTE — Progress Notes (Signed)
Physical Therapy Session Note  Patient Details  Name: [name] [name] MRN: [id number] Date of Birth: 07/20/1960  Today's Date: 12/04/2020 PT Individual Time: 1000-1100 PT Individual Time Calculation (min): 60 min   Short Term Goals: Week 1:  PT Short Term Goal 1 (Week 1): Pt will perform bed mobility with mod assist PT Short Term Goal 1 - Progress (Week 1): Met PT Short Term Goal 2 (Week 1): Pt will perform SB transfer with max A +2 PT Short Term Goal 2 - Progress (Week 1): Met PT Short Term Goal 3 (Week 1): Pt will tolerate sitting in WC >2 hours between therapies PT Short Term Goal 3 - Progress (Week 1): Met PT Short Term Goal 4 (Week 1): Pt will perform sit>stand with +2 assist PT Short Term Goal 4 - Progress (Week 1): Not met Week 2:  PT Short Term Goal 1 (Week 2): pt to demonstrate STS with LRAD max A x1 PT Short Term Goal 2 (Week 2): pt to demonstrate dynamic sitting balance at supervision PT Short Term Goal 3 (Week 2): pt to initiate static standing at max A Week 3:     Skilled Therapeutic Interventions/Progress Updates:    Pain:  Pt reports5/10 LE pain.  Treatment to tolerance.  Rest breaks and repositioning as needed.  DARCO boots utilized for mobility.  Pt instructed w/operation of legrests for repositioning as needed in [place].  Pt initially supine and agreeable to treatment session w/focus on strength, transfers, endurance, wc skills. Pt performed bilat Hhip abd/add, hip/knee flexion x 12 each Supine to sit w min assist Static sit w/supervision Performed beach ball toss/catch x 3 min for endurance. Pt then instructed to verbally instruct therapist w/set up/sequencing of Sliding board transfer but requries much assist/cueing to do this. Bed to wc max assist of 1, total assist of 1. Pt instructed w/wc controls and instructed w/tilting wc, elevating/lowering legs for repositioning.  Pt titlted fully w/cues then able to reposition in wc for comfort without assist.   Returns  upright w/verbal cues. Propels wc x 118f x 2 to/from gym w/supervision.  Worked on turning/backing to mat in gym  Therex: Zoom ball x 4 min for endurance, UE strengthening LE LAQ x 20 for strengthening  Pt again reviewed controls for positioning wc.  W/min cues pt able to tilt, elevate/lower LEs.  Pt propelled to room and worked on backing wc adjacent to bed w/cues, additional time.  Pt left oob in wc w/alarm belt set and needs in reach     Therapy Documentation Precautions:  Precautions Precautions: Fall Precaution Comments: Gangrenous changes in hands Rt>Lt, open foot wounds bilaterally Restrictions Weight Bearing Restrictions: Yes RLE Weight Bearing: Non weight bearing LLE Weight Bearing: Non weight bearing Other Position/Activity Restrictions: WBAT B LEs while wearing compression socks + Darco shoes  Therapy/Group: Individual Therapy  BCallie Fielding PHaviland5/25/2022, 4:52 PM

## 2020-12-05 ENCOUNTER — Inpatient Hospital Stay (HOSPITAL_COMMUNITY): Payer: 59

## 2020-12-05 DIAGNOSIS — R5381 Other malaise: Secondary | ICD-10-CM | POA: Diagnosis not present

## 2020-12-05 MED ORDER — DOXYCYCLINE HYCLATE 100 MG PO TABS
100.0000 mg | ORAL_TABLET | Freq: Two times a day (BID) | ORAL | Status: DC
  Administered 2020-12-05 – 2020-12-12 (×14): 100 mg via ORAL
  Filled 2020-12-05 (×15): qty 1

## 2020-12-05 NOTE — Progress Notes (Signed)
PROGRESS NOTE   Subjective/Complaints:  Thinks leg/foot pain is ~ 10% better with increase in gabapentin.  Had LE dressings done at midnight.   Was really hopeful might shower? After we discussed her improving SB transfers.   ROS:   Pt denies SOB, abd pain, CP, N/V/C/D, and vision changes    Objective:   No results found. No results for input(s): WBC, HGB, HCT, PLT in the last 72 hours. No results for input(s): NA, K, CL, CO2, GLUCOSE, BUN, CREATININE, CALCIUM in the last 72 hours.  Intake/Output Summary (Last 24 hours) at 12/05/2020 0900 Last data filed at 12/04/2020 2004 Gross per 24 hour  Intake 540 ml  Output 650 ml  Net -110 ml        Physical Exam: Vital Signs Blood pressure 100/70, pulse 73, temperature 98.3 F (36.8 C), temperature source Oral, resp. rate 17, height 5\' 4"  (1.626 m), weight 84 kg, last menstrual period 11/22/2020, SpO2 95 %.       General: awake, alert, appropriate, sitting up in bed; PT in room; NAD HENT: conjugate gaze; oropharynx moist CV: regular rate; no JVD Pulmonary: CTA B/L; no W/R/R- good air movement GI: soft, NT, ND, (+)BS Psychiatric: appropriate; appears a little less interactive; putting on a bright face, but appears more sad this AM Neurological: alert; STM issues slightly improved Skin: .  Dry gangrene distal digits- losing more range of motion in 2nd-5th digits at PIPs on L, not on R- looks slightly better this AM- also gangrene on L fingers MUCH improved last few days- Feet are wrapped with silvadene- goopy with some bleeding mainly on R first toe.  Scattered areas of eschar Bilateral hands with blanchable erythema on palms-new skin looks like toughening up a little; L wrist large scab pulling up-  Musc: No edema in extremities.  No tenderness in extremities. Motor:  Motor: B/l UE: Shoulder abduction, elbow flexion/extention 4/5, hand limited to due pain, stable.   RLE: HF, KE 4/5, ADF 0/5 (some pain inhibition) LLE: HF, KE 4/5, ADF 2-/5 (some pain inhibition)   Assessment/Plan: 1. Functional deficits which require 3+ hours per day of interdisciplinary therapy in a comprehensive inpatient rehab setting.  Physiatrist is providing close team supervision and 24 hour management of active medical problems listed below.  Physiatrist and rehab team continue to assess barriers to discharge/monitor patient progress toward functional and medical goals  Care Tool:  Bathing    Body parts bathed by patient: Right arm,Left arm,Chest,Abdomen,Face,Right upper leg,Left upper leg   Body parts bathed by helper: Front perineal area,Buttocks,Right lower leg,Left lower leg     Bathing assist Assist Level: Moderate Assistance - Patient 50 - 74%     Upper Body Dressing/Undressing Upper body dressing   What is the patient wearing?: Pull over shirt    Upper body assist Assist Level: Moderate Assistance - Patient 50 - 74%    Lower Body Dressing/Undressing Lower body dressing      What is the patient wearing?: Pants     Lower body assist Assist for lower body dressing: Total Assistance - Patient < 25%     Toileting Toileting    Toileting assist Assist for toileting:  Dependent - Patient 0%     Transfers Chair/bed transfer  Transfers assist     Chair/bed transfer assist level: Total Assistance - Patient < 25% (slide board)     Locomotion Ambulation   Ambulation assist   Ambulation activity did not occur: Safety/medical concerns          Walk 10 feet activity   Assist  Walk 10 feet activity did not occur: Safety/medical concerns        Walk 50 feet activity   Assist Walk 50 feet with 2 turns activity did not occur: Safety/medical concerns         Walk 150 feet activity   Assist Walk 150 feet activity did not occur: Safety/medical concerns         Walk 10 feet on uneven surface  activity   Assist Walk 10 feet on  uneven surfaces activity did not occur: Safety/medical concerns         Wheelchair     Assist Will patient use wheelchair at discharge?: Yes Type of Wheelchair: Power    Wheelchair assist level: Supervision/Verbal cueing Max wheelchair distance: 300'    Wheelchair 50 feet with 2 turns activity    Assist        Assist Level: Supervision/Verbal cueing   Wheelchair 150 feet activity     Assist      Assist Level: Supervision/Verbal cueing   Blood pressure 100/70, pulse 73, temperature 98.3 F (36.8 C), temperature source Oral, resp. rate 17, height 5\' 4"  (1.626 m), weight 84 kg, last menstrual period 11/22/2020, SpO2 95 %.   Medical Problem List and Plan: 1.  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 2.  PE/Antithrombotics: -DVT/anticoagulation:  Pharmaceutical: Other (comment)--Eliquis             -antiplatelet therapy: N/A 3. Pain Management: Oxycodone 5mg  q 6 h prn, also  Gabapentin 300mg  BID - increase to TID  Increased gabapentin to 400 mg QID   5/24- will increase Gabapentin to 600 mg QID; will also add Duloxetine 30 mg QHS for pain- if tolerates, will increase to 60 mg and STOP Lexapro at that time  5/26- pain ~10% better- con't regimen- explained usually takes ~ 1 week to notice improvement with Duloxetine.              Monitor with increased exertion 4. Mood: LCSW to follow for evaluation and support. Team to offer ego support.   Increased Lexapro to 20 mg daily on 5/16  5/24- as above, will stop lexapro once increase duloxetine, if tolerates- will also add Buspar for anxiety 5 mg TID.   5/26- mood about the same- con't regimen             -antipsychotic agents: N/A 5. Neuropsych: This patient is capable of making decisions on her own behalf. 6. Skin/Wound Care:               Prevalon boots for use when in bed. Continue left hand splint.              --Darco shoe for weight bearing per PT input.   5/18- approved to do silvadene and wound care for feet as well as PRAFOs for ankles- to wear at night.  5/23- pt likes to wear PRAFOs- explained can wear  them any time not in therapy  5/24- asked PT to have OT make L hand splint to help formation of contractures of L hand. Will reinforce to OT when see her.   5/26- has L hand splint/brace- wore last night- con't regimen-will place tegaderm for L wrist scab  7. Fluids/Electrolytes/Nutrition: Monitor  I/Os             Added protein  supplements to help promote wound healing.  8.  PAF: Monitor HR tid--continue Metoprolol and Cardizem for rate control.             Vitals:   12/04/20 1928 12/05/20 0457  BP: 98/61 100/70  Pulse: 69 73  Resp: 17 17  Temp: 98.4 F (36.9 C) 98.3 F (36.8 C)  SpO2: 100% 95%   5/26- BP soft- will d/w PT if dropping BP? 9. Dysphagia: Continue aspiration precautions--No straws.              Advance diet as tolerated  5/23- SLP still seeing- con't regimen  Intermittent supervision 10. ABLA              Hemoglobin 11.3 on 5/16, labs ordered for tomorrow  5/24- Hb 11.0- con't regimen  11. Forming contractures- DIPs B/L and ankles  Continue PRAFOs nightly also will try and get some "arthritis gloves" for hands to help with forming DIP contractures?  5/23-  con't PRAFOs  5/24- will get resting hand splint made for L hand due to worsening contractures.   5/26- wearing L hand splint/brace- mild improvement in finger curling/contracture formation.  12. Loose stools  Improved  LOS: 13 days A FACE TO FACE EVALUATION WAS PERFORMED  Macguire Holsinger 12/05/2020, 9:00 AM

## 2020-12-05 NOTE — Progress Notes (Signed)
Physical Therapy Session Note  Patient Details  Name: [name] [name] MRN: [id number] Date of Birth: 07/05/1961  Today's Date: 12/05/2020 PT Individual Time: 0805-0845 PT Individual Time Calculation (min): 40 min   Short Term Goals: Week 1:  PT Short Term Goal 1 (Week 1): Pt will perform bed mobility with mod assist PT Short Term Goal 1 - Progress (Week 1): Met PT Short Term Goal 2 (Week 1): Pt will perform SB transfer with max A +2 PT Short Term Goal 2 - Progress (Week 1): Met PT Short Term Goal 3 (Week 1): Pt will tolerate sitting in WC >2 hours between therapies PT Short Term Goal 3 - Progress (Week 1): Met PT Short Term Goal 4 (Week 1): Pt will perform sit>stand with +2 assist PT Short Term Goal 4 - Progress (Week 1): Not met Week 2:  PT Short Term Goal 1 (Week 2): pt to demonstrate STS with LRAD max A x1 PT Short Term Goal 2 (Week 2): pt to demonstrate dynamic sitting balance at supervision PT Short Term Goal 3 (Week 2): pt to initiate static standing at max A  Skilled Therapeutic Interventions/Progress Updates:   Pt received supine in bed and agreeable to PT. MD present for rounding, questioning ability to access shower. Per recent PT note, pt able to complete SB transfer with max assist of 1. MD reports pt may be cleared to shower with assist from therapy.  Pt instructed pt supine therex. Hip abduction, SLR, SAQ, ankle DF, hip/knee flexion extension. Intermittent AROM and manual resistance each completed x 10 BLE. Chest press and bicep curl x 10 with 3# bar weight, tricep extension, lat pull down with level 1 tband, x 12 BUE. Pt left supine in bed with call bell in reach and all needs met.      Therapy Documentation Precautions:  Precautions Precautions: Fall Precaution Comments: Gangrenous changes in hands Rt>Lt, open foot wounds bilaterally Restrictions Weight Bearing Restrictions: Yes RLE Weight Bearing: Non weight bearing LLE Weight Bearing: Non weight bearing Other  Position/Activity Restrictions: WBAT B LEs while wearing compression socks + Darco shoes    Pain: denies   Therapy/Group: Individual Therapy  Austin E Tucker 12/05/2020, 5:53 PM  

## 2020-12-05 NOTE — Progress Notes (Signed)
Physical Therapy Session Note  Patient Details  Name: [name] [name] MRN: [id number] Date of Birth: [birth year]  Today's Date: 12/05/2020 PT Individual Time: 1100-1200 and 1445-1527 PT Individual Time Calculation (min): 60 min and 42 mins  Short Term Goals: Week 1:  PT Short Term Goal 1 (Week 1): Pt will perform bed mobility with mod assist PT Short Term Goal 1 - Progress (Week 1): Met PT Short Term Goal 2 (Week 1): Pt will perform SB transfer with max A +2 PT Short Term Goal 2 - Progress (Week 1): Met PT Short Term Goal 3 (Week 1): Pt will tolerate sitting in WC >2 hours between therapies PT Short Term Goal 3 - Progress (Week 1): Met PT Short Term Goal 4 (Week 1): Pt will perform sit>stand with +2 assist PT Short Term Goal 4 - Progress (Week 1): Not met Week 2:  PT Short Term Goal 1 (Week 2): pt to demonstrate STS with LRAD max A x1 PT Short Term Goal 2 (Week 2): pt to demonstrate dynamic sitting balance at supervision PT Short Term Goal 3 (Week 2): pt to initiate static standing at max A  Skilled Therapeutic Interventions/Progress Updates:    session 1: pt received in bed and agreeable to therapy. Pt directed in supine>sit mod A and CGA for EOB balance. Pt directed in x3 Sit to stand in stedy with first rep 75% upright and next reps full standing though very briefly 2/2 pain in B feet with weight bearing mod A x2 to complete. Pt unable to tolerate sitting on stedy seat for support and benefited from standing from bedside instead. Pt reported great fatigue after this and requested to return to bed, mod A to complete and mod A for repositioning in bed. Pt directed in BLE strengthening exercises in bed, 2x10 hip abduction/adduction, small leg lifts, and PROM to R ankle for improved dorsiflexion 3x45s. Pt left in bed, All needs in reach and in good condition. Call light in hand.  And alarm set.  Session 2: pt received in bed and agreeable to therapy. Pt directed in supine to sit min A with pt  able to bring BLE to EOB, pt required max A x2 for posterior weight shift for improved positioning, sat EOB remainder of session for BLE and BUE strengthening exercises. 2x10 punches, rows with orange theraband, overhead press, front raise, and horizontal abd/adduction with small ball, 2x10 LAQ and marches. Pt then directed in sit>supine min A and mod A x2 to scoot up in bed, All needs in reach and in good condition. Call light in hand.    Therapy Documentation Precautions:  Precautions Precautions: Fall Precaution Comments: Gangrenous changes in hands Rt>Lt, open foot wounds bilaterally Restrictions Weight Bearing Restrictions: Yes RLE Weight Bearing: Non weight bearing LLE Weight Bearing: Non weight bearing Other Position/Activity Restrictions: WBAT B LEs while wearing compression socks + Darco shoes General:   Vital Signs: Therapy Vitals Temp: 98.3 F (36.8 C) Temp Source: Oral Pulse Rate: 66 Resp: 16 BP: 107/72 Patient Position (if appropriate): Lying Oxygen Therapy SpO2: 97 % O2 Device: Room Air Pain:   Mobility:   Locomotion :    Trunk/Postural Assessment :    Balance:   Exercises:   Other Treatments:      Therapy/Group: Individual Therapy  Junie Panning 12/05/2020, 3:44 PM

## 2020-12-05 NOTE — Progress Notes (Signed)
Occupational Therapy Session Note  Patient Details  Name: [name] [name] MRN: [id number] Date of Birth: [birth year]  Today's Date: 12/05/2020 OT Individual Time: 1287-8676 OT Individual Time Calculation (min): 54 min    Short Term Goals: Week 1:  OT Short Term Goal 1 (Week 1): Pt will sit EOB for ~5 minutes while engaged in an ADL task to increase upright tolerance OT Short Term Goal 1 - Progress (Week 1): Met OT Short Term Goal 2 (Week 1): Pt will complete LB dressing at sit<stand level with LRAD OT Short Term Goal 2 - Progress (Week 1): Not met OT Short Term Goal 3 (Week 1): Pt don an overhead shirt with no more than Mod A using adaptive strategies as needed OT Short Term Goal 3 - Progress (Week 1): Met Week 2:  OT Short Term Goal 1 (Week 2): Pt will complete slide board transfer with 1 helper in prep for ADL OOB OT Short Term Goal 2 (Week 2): Pt will complete UB dressing no more than Min A OT Short Term Goal 3 (Week 2): Pt will utilize emotional coping strategies with no more than Min cues consistently to increase participation OT Short Term Goal 4 (Week 2): Pt will perform LB dressing with Max A of 1   Skilled Therapeutic Interventions/Progress Updates:    Session 1: Pt greeted at time of session supine in bed resting agreeable to OT session, no pain. Spoke with PT and MD this am that pt wanted to take a shower, confirmed with the pt and will plan for tomorrow. Retrieved rolling shower chair at beginning of session and placed in pt room for tomorrow, explained how we will transfer with maximove to decrease anxiety for the pt for showering. Donned shorts bed level Max/total having pt attempt to hook thumbs to help pull up, rolling R/L for donning fully over hips. Supine > sit Min A and BP checked 100/64 as reports pt had low BP recently. 1/10 lightheadedness reported. Sit <> partial stands at [facility] with Max A to come up to standing x3 trials with [facility] shoes donned, pt very excited about  her progress and trying to stand. Sit > supine Mod A call bell in reach all needs met.     Therapy Documentation Precautions:  Precautions Precautions: Fall Precaution Comments: Gangrenous changes in hands Rt>Lt, open foot wounds bilaterally Restrictions Weight Bearing Restrictions: Yes RLE Weight Bearing: Non weight bearing LLE Weight Bearing: Non weight bearing Other Position/Activity Restrictions: WBAT B LEs while wearing compression socks + Darco shoes     Therapy/Group: Individual Therapy  [name] 12/05/2020, 7:22 AM

## 2020-12-05 NOTE — Progress Notes (Signed)
Patient had 2 mm superficial blister at the edge of necrotic tip of right index finger--nontender without surrounding erythema.  Was able to fully express small amount of purulent drainage-->doxycycline added for wound prophylaxis. Will Xray digit to make sure no underlying infection is simmering or present. Marland Kitchen

## 2020-12-06 DIAGNOSIS — F329 Major depressive disorder, single episode, unspecified: Secondary | ICD-10-CM

## 2020-12-06 DIAGNOSIS — R5381 Other malaise: Secondary | ICD-10-CM | POA: Diagnosis not present

## 2020-12-06 DIAGNOSIS — G7281 Critical illness myopathy: Secondary | ICD-10-CM | POA: Diagnosis not present

## 2020-12-06 MED ORDER — COLLAGENASE 250 UNIT/GM EX OINT
TOPICAL_OINTMENT | Freq: Every day | CUTANEOUS | Status: DC
  Filled 2020-12-06 (×2): qty 30

## 2020-12-06 MED ORDER — JUVEN PO PACK
1.0000 | PACK | Freq: Two times a day (BID) | ORAL | Status: DC
  Administered 2020-12-06 – 2020-12-17 (×4): 1 via ORAL
  Filled 2020-12-06 (×14): qty 1
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
## 2020-12-06 NOTE — Progress Notes (Signed)
Physical Therapy Session Note  Patient Details  Name: Katheryne Gorr MRN: [id number] Date of Birth: [birth year]  Today's Date: 12/06/2020 PT Individual Time: 0900-0946 PT Individual Time Calculation (min): 46 min   Short Term Goals: Week 1:  PT Short Term Goal 1 (Week 1): Pt will perform bed mobility with mod assist PT Short Term Goal 1 - Progress (Week 1): Met PT Short Term Goal 2 (Week 1): Pt will perform SB transfer with max A +2 PT Short Term Goal 2 - Progress (Week 1): Met PT Short Term Goal 3 (Week 1): Pt will tolerate sitting in WC >2 hours between therapies PT Short Term Goal 3 - Progress (Week 1): Met PT Short Term Goal 4 (Week 1): Pt will perform sit>stand with +2 assist PT Short Term Goal 4 - Progress (Week 1): Not met Week 2:  PT Short Term Goal 1 (Week 2): pt to demonstrate STS with LRAD max A x1 PT Short Term Goal 2 (Week 2): pt to demonstrate dynamic sitting balance at supervision PT Short Term Goal 3 (Week 2): pt to initiate static standing at max A      Skilled Therapeutic Interventions/Progress Updates:    pt received in bed and agreeable to therapy. Pt directed in rolling multiple times during session at min A for LE management. Pt requested to sit up to eat breakfast as she was unable to eat it this AM, pt directed in improved positioning with attempting to sit EOB however pt reported increased pain and nausea with this and requested to return to supine. Pt directed in midline position in bed and hospital bed function used to sit in semi-chair position. Pt directed in reaching forward and in various directions to add therapeutic challenge to this. Once pt completed meal, pt requested to use restroom, female urinal used with (+) bladder void. Pt then given wash cloth to assist in pericare ultimately needing max A to complete. Pt then directed in additional rolling for bed linen change min A. Pt requested to end session early due to greatly increased pain in hands mostly but  pain also reported globally. Pt educated in mobility techniques and breathing techniques to decrease pain and promote increased relief and comfort while in bed, pt reported understanding and appreciative. Pt left in bed, All needs in reach and in good condition. Call light in hand.     Therapy Documentation Precautions:  Precautions Precautions: Fall Precaution Comments: Gangrenous changes in hands Rt>Lt, open foot wounds bilaterally Restrictions Weight Bearing Restrictions: Yes RLE Weight Bearing: Non weight bearing LLE Weight Bearing: Non weight bearing Other Position/Activity Restrictions: WBAT B LEs while wearing compression socks + Darco shoes General:   Vital Signs: Therapy Vitals Temp: 98.2 F (36.8 C) Pulse Rate: 79 Resp: 16 BP: (!) 102/53 Patient Position (if appropriate): Lying Oxygen Therapy SpO2: 96 % O2 Device: Room Air Pain:   Mobility:   Locomotion :    Trunk/Postural Assessment :    Balance:   Exercises:   Other Treatments:      Therapy/Group: Individual Therapy  Junie Panning 12/06/2020, 3:59 PM

## 2020-12-06 NOTE — Progress Notes (Signed)
Occupational Therapy Session Note  Patient Details  Name: [name] [name] MRN: [id number] Date of Birth: [birth year]  Today's Date: 12/06/2020 OT Individual Time: 1430-1539 OT Individual Time Calculation (min): 69 min    Short Term Goals: Week 1:  OT Short Term Goal 1 (Week 1): Pt will sit EOB for ~5 minutes while engaged in an ADL task to increase upright tolerance OT Short Term Goal 1 - Progress (Week 1): Met OT Short Term Goal 2 (Week 1): Pt will complete LB dressing at sit<stand level with LRAD OT Short Term Goal 2 - Progress (Week 1): Not met OT Short Term Goal 3 (Week 1): Pt don an overhead shirt with no more than Mod A using adaptive strategies as needed OT Short Term Goal 3 - Progress (Week 1): Met Week 2:  OT Short Term Goal 1 (Week 2): Pt will complete slide board transfer with 1 helper in prep for ADL OOB OT Short Term Goal 2 (Week 2): Pt will complete UB dressing no more than Min A OT Short Term Goal 3 (Week 2): Pt will utilize emotional coping strategies with no more than Min cues consistently to increase participation OT Short Term Goal 4 (Week 2): Pt will perform LB dressing with Max A of 1   Skilled Therapeutic Interventions/Progress Updates:    Pt greeted at time of session supine in bed resting with husband [name] [name] present who remained throughout session. Agreeable to EOB activity with encouragement. Declined shower as planned today d/t "not feeling up to it" and hands are sore/feeling "raw" today after working yesterday. Also lats are sore, determined from pulling up at [facility] yesterday. Supine > sit Mod A for trunk and LEs. Sitting EOB with 1.5# weights around wrists for BUE strengthening for the following: chest press, overhead press, single hand punches, single hand punch + twist, all reaching for a target and encouraging full ROM. Rest breaks in between for 2x10-20 reps. 1x10 forward weight shifting with lightweight beach ball for core/trunk and pt did not like this  activity. Note DARCO shoes on throughout for weight bearing. Declined all standing and ADL today. Ice pack retrieved and placed on L hand for comfort. AROM and PROM measurements taken for L hand digits 3-5, see SN. Pt supine and noted to have finger nail "coming off" but only slightly, relayed to NT who will relay to RN. Call bell in reach all needs met.   Therapy Documentation Precautions:  Precautions Precautions: Fall Precaution Comments: Gangrenous changes in hands Rt>Lt, open foot wounds bilaterally Restrictions Weight Bearing Restrictions: Yes RLE Weight Bearing: Non weight bearing LLE Weight Bearing: Non weight bearing Other Position/Activity Restrictions: WBAT B LEs while wearing compression socks + Darco shoes     Therapy/Group: Individual Therapy  [name] 12/06/2020, 1:01 PM

## 2020-12-06 NOTE — Consult Note (Addendum)
Reason for Consult: Finger infection Referring Physician: Theodora Blow Time called: 1030 Time at bedside: 1110   [name] [name] is an 60 y.o. female.  HPI: [name] [name] has had a long hospital course after sepsis and DIC. She was HoTN early on requiring pressors and quickly developed ischemia of several fingertips and areas on her lower extremities. These have progressed to complete necrosis for most of them. She recently developed a small infection at the gangrenous border of her ring finger. Some purulent fluid was expressed by the PA yesterday and hand surgery was asked to consult today. She notes the finger feels better since yesterday.  Past Medical History:  Diagnosis Date   Depression    Generalized anxiety disorder    NSTEMI (non-ST elevated myocardial infarction) [facility])     Past Surgical History:  Procedure Laterality Date   CARDIAC CATHETERIZATION  09/2020    Family History  Problem Relation Age of Onset   Lung cancer Mother    Stroke Father    Heart attack Maternal Grandmother     Social History:  reports that she has never smoked. She has never used smokeless tobacco. She reports previous alcohol use. She reports that she does not use drugs.  Allergies:  Allergies  Allergen Reactions   Apremilast     Other reaction(s): Migraines   Nabumetone     Other reaction(s): GI Problems   Prednisone     Other reaction(s): depression    Medications: I have reviewed the patient's current medications.  No results found for this or any previous visit (from the past 48 hour(s)).  DG Finger Index Right  Result Date: 12/05/2020 CLINICAL DATA:  Wound drainage EXAM: RIGHT INDEX FINGER 2+V COMPARISON:  None. FINDINGS: No fracture or malalignment. No periostitis or bone destruction. Generalized soft tissue swelling with tapered appearance of soft tissues at the level of distal phalanx. IMPRESSION: No acute osseous abnormality Electronically Signed   By: Jasmine Pang M.D.   On:  12/05/2020 22:07    Review of Systems  HENT: Negative for ear discharge, ear pain, hearing loss and tinnitus.   Eyes: Negative for photophobia and pain.  Respiratory: Negative for cough and shortness of breath.   Cardiovascular: Negative for chest pain.  Gastrointestinal: Negative for abdominal pain, nausea and vomiting.  Genitourinary: Negative for dysuria, flank pain, frequency and urgency.  Musculoskeletal: Positive for arthralgias (Bilateral hands). Negative for back pain, myalgias and neck pain.  Neurological: Negative for dizziness and headaches.  Hematological: Does not bruise/bleed easily.  Psychiatric/Behavioral: The patient is nervous/anxious.    Blood pressure (!) 96/59, pulse 87, temperature 98.5 F (36.9 C), temperature source Oral, resp. rate 17, height 5\' 4"  (1.626 m), weight 83 kg, last menstrual period 11/22/2020, SpO2 96 %. Physical Exam Constitutional:      General: She is not in acute distress.    Appearance: She is well-developed. She is not diaphoretic.  HENT:     Head: Normocephalic and atraumatic.  Eyes:     General: No scleral icterus.       Right eye: No discharge.        Left eye: No discharge.     Conjunctiva/sclera: Conjunctivae normal.  Cardiovascular:     Rate and Rhythm: Normal rate and regular rhythm.  Pulmonary:     Effort: Pulmonary effort is normal. No respiratory distress.  Musculoskeletal:     Cervical back: Normal range of motion.     Comments: Bilateral shoulder, elbow, wrist, digits- Necrosis of right fingertips 3-5,  P3 index, and tips of left 3-5 , minimally TTP, no instability, no blocks to motion  Sens  Ax/R/M/U intact  Mot   Ax/ R/ PIN/ M/ AIN/ U intact  Rad 2+  Skin:    General: Skin is warm and dry.  Neurological:     Mental Status: She is alert.  Psychiatric:        Behavior: Behavior normal.     Assessment/Plan: Finger ischemia -- I don't feel there's anything to do differently for the finger infection. It appears it's  doing well with current regimen. Discussed surgical treatment of necrotic areas vs watchful waiting. She will consider options. She should f/u with Dr. Merlyn Lot as OP once discharged.    Freeman Caldron, PA-C Orthopedic Surgery [phone] 12/06/2020, 11:37 AM  Addendum (12/06/2020): Patient seen and examined.  Agree with above. H: 60 year old female status post sepsis and DIC.  Required pressors.  Has developed dry necrosis of fingertips.  She states this is minimally painful at this time.  She is working with therapy on range of motion for the digits of the left hand.  She has had recovery of soft tissue in the hands.  The left small finger nail started to peel up today and she has put a Band-Aid on it. Exam: Intact sensation capillary refill in the fingertips over intact skin.  In the left hand there is dry necrosis at the tips of the digits.  No sign of infection.  There is some discoloration under the nails as well.  In the right hand there is dry necrosis of the distal phalanx of the index finger and distal aspect of the middle phalanx.  Dry necrosis of the distal aspect of the distal phalanges of the long ring and small fingers with necrosis under the nails distally.  There is approximate 1/2-1/3 of the nails proximally with what appears to be intact nailbed underneath.  No sign of infection in the digits of the right hand.  She can make a complete fist.  In the left hand she has contractures of the PIP joints especially in the long and ring fingers. A/P: Dry necrosis of multiple digits.  We discussed that the digits on the right hand will likely require amputation of the necrotic portions for healing.  The necrosis in the distal aspect and the digits of the left hand may be able to heal without surgical intervention though could benefit from removal of necrotic tissue to allow for quicker healing.  This does not have to be done immediately as the fingers are not infected.  It may be best to do  this under more than 1 surgical setting as well given the number of digits involved.  In the right hand the index finger will likely have to be amputated part way through the middle phalanx trying to preserve the PIP joint and FDS tendon.  The long ring and small fingers will hopefully be able to be amputated through the distal phalanx preserving the DIP joint and FDP tendons.  She states she would like some time to come to grips with the necessity of having to do this.  Again this is not emergent at this time.  As long as no signs of infection this can be done as an outpatient.  Recommend keeping the areas clean and dry.  Continue working with therapy on range of motion.  She is comfortable with the plan.  We will follow.

## 2020-12-06 NOTE — Progress Notes (Signed)
Speech Language Pathology Daily Session Note  Patient Details  Name: Issa Luster MRN: [id number] Date of Birth: March 05, 1961  Today's Date: 12/06/2020 SLP Individual Time: 1350-1430 SLP Individual Time Calculation (min): 40 min  Short Term Goals: Week 2: SLP Short Term Goal 1 (Week 2): Pt will demonstrate complex problem solving in functional task (ex: medication/money/time management) with supervison A verbal cues. SLP Short Term Goal 2 (Week 2): Pt will demonstrate alternating attention during complex tasks in 10 minute intervals with Mod I verbal cues. SLP Short Term Goal 3 (Week 2): Pt will demonstrate recall of novel, daily information with Mod I verbal cues. SLP Short Term Goal 4 (Week 2): Pt will participate in higher level word finding task (ex: divergent and convergent naming) with supervision A semantic cues.  Skilled Therapeutic Interventions: Skilled treatment session focused on word-finding goals. SLP facilitated session by providing education regarding word-finding strategies. Patient participated in a complex verbal expression task that focused on utilization of strategies with overall extra time and supervision level verbal cues. Patient left upright in bed with alarm on and all needs within reach. Continue with current plan of care.      Pain No/Denies Pain   Therapy/Group: Individual Therapy  Brenly Trawick 12/06/2020, 3:12 PM

## 2020-12-06 NOTE — Progress Notes (Signed)
Was asked to reevaluate her bilateral lower extremities and tip of index finger secondary to concerning changes.  Left foot no ascending cellulitis she does have some tibial skin breakdown in the ankle necrosis in the toes.  Improved from previous evaluation.  Right ankle similar findings but she does have necrosis on the heel as well as areas of macerated skin and yellowish X eschar and skin breakdown.  This does not probe deeply there is no surrounding cellulitis no foul odor.  Would recommend not using any more clear dressings over any of the wound.  In the areas of concern on the right I would use Santyl dressing changes in the other areas I would use very light amount of Silvadene and dry dressing.  May cleanse with antibacterial soap and water if tolerated.  Then apply new dressing.  Patient would do best in compression socks but simply cannot tolerate this at this time
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
## 2020-12-07 DIAGNOSIS — I96 Gangrene, not elsewhere classified: Secondary | ICD-10-CM | POA: Diagnosis not present

## 2020-12-07 DIAGNOSIS — R5381 Other malaise: Secondary | ICD-10-CM | POA: Diagnosis not present

## 2020-12-07 DIAGNOSIS — G7281 Critical illness myopathy: Secondary | ICD-10-CM | POA: Diagnosis not present

## 2020-12-07 NOTE — Progress Notes (Signed)
C/O pain LE's throbbing and aching ask that orth boots be removed , medicated with prn, ( Oxycodone)

## 2020-12-07 NOTE — Progress Notes (Signed)
Speech Language Pathology Daily Session Note  Patient Details  Name: [name] [name] MRN: [id number] Date of Birth: June 04, 1961  Today's Date: 12/07/2020 SLP Individual Time: 0932-1003 SLP Individual Time Calculation (min): 31 min  Short Term Goals: Week 2: SLP Short Term Goal 1 (Week 2): Pt will demonstrate complex problem solving in functional task (ex: medication/money/time management) with supervison A verbal cues. SLP Short Term Goal 2 (Week 2): Pt will demonstrate alternating attention during complex tasks in 10 minute intervals with Mod I verbal cues. SLP Short Term Goal 3 (Week 2): Pt will demonstrate recall of novel, daily information with Mod I verbal cues. SLP Short Term Goal 4 (Week 2): Pt will participate in higher level word finding task (ex: divergent and convergent naming) with supervision A semantic cues.  Skilled Therapeutic Interventions: Pt seen for skilled ST with focus on cognitive goals. Pt participating in mildly complex divided attention task during functional word finding task. Pt requiring mod A verbal cues throughout to increase attention, sequencing and word finding. Pt reports not feeling "herself" today, mostly fatigue and mild pain. Pt continues to endorse word finding deficits and decreased orientation. SLP providing visual aid of May/June calendar to increase orientation and decreased frustration. Pt encouraged to utilize iPhone to recall and carryover orientation concepts as well. Pt left in bed with alarm set and all needs within reach. Cont ST POC.  Pain Pain Assessment Pain Scale: 0-10 Pain Score: 3  Pain Location: Hand Pain Orientation: Right;Left Pain Descriptors / Indicators: Aching Pain Frequency: Intermittent Pain Onset: On-going Pain Intervention(s): Medication (See eMAR)  Therapy/Group: Individual Therapy  Tacey Ruiz 12/07/2020, 10:10 AM

## 2020-12-07 NOTE — Progress Notes (Signed)
PROGRESS NOTE   Subjective/Complaints:  No new issues today. Picking at one of wounds on fingers. Pain seems controlled.   ROS: Patient denies fever, rash, sore throat, blurred vision, nausea, vomiting, diarrhea, cough, shortness of breath or chest pain, joint or back pain, headache, or mood change.      Objective:   DG Finger Index Right  Result Date: 12/05/2020 CLINICAL DATA:  Wound drainage EXAM: RIGHT INDEX FINGER 2+V COMPARISON:  None. FINDINGS: No fracture or malalignment. No periostitis or bone destruction. Generalized soft tissue swelling with tapered appearance of soft tissues at the level of distal phalanx. IMPRESSION: No acute osseous abnormality Electronically Signed   By: Jasmine Pang M.D.   On: 12/05/2020 22:07   No results for input(s): WBC, HGB, HCT, PLT in the last 72 hours. No results for input(s): NA, K, CL, CO2, GLUCOSE, BUN, CREATININE, CALCIUM in the last 72 hours.  Intake/Output Summary (Last 24 hours) at 12/07/2020 1208 Last data filed at 12/07/2020 0815 Gross per 24 hour  Intake 794 ml  Output 300 ml  Net 494 ml        Physical Exam: Vital Signs Blood pressure 117/67, pulse 97, temperature 98.2 F (36.8 C), temperature source Oral, resp. rate 17, height 5\' 4"  (1.626 m), weight 83 kg, last menstrual period 11/22/2020, SpO2 94 %.    Constitutional: No distress . Vital signs reviewed. HEENT: EOMI, oral membranes moist Neck: supple Cardiovascular: RRR without murmur. No JVD    Respiratory/Chest: CTA Bilaterally without wheezes or rales. Normal effort    GI/Abdomen: BS +, non-tender, non-distended Ext: no clubbing, cyanosis, or edema Psych: pleasant and cooperative Neurological: alert; STM issues more notable Skin: .  Dry gangrene scattered fingers both hands Has lidocaine ointment on hands, dressings Gangrenous feet, toes---both dressed Musc: No edema in extremities.  No tenderness in  extremities. Motor:  Motor: B/l UE: Shoulder abduction, elbow flexion/extention 4/5, hand limited to due pain, stable.  RLE: HF, KE 4/5, ADF 0/5 (  pain inhibition) LLE: HF, KE 4/5, ADF 2-/5 (  pain inhibition)   Assessment/Plan: 1. Functional deficits which require 3+ hours per day of interdisciplinary therapy in a comprehensive inpatient rehab setting.  Physiatrist is providing close team supervision and 24 hour management of active medical problems listed below.  Physiatrist and rehab team continue to assess barriers to discharge/monitor patient progress toward functional and medical goals  Care Tool:  Bathing    Body parts bathed by patient: Right arm,Left arm,Chest,Abdomen,Face,Right upper leg,Left upper leg   Body parts bathed by helper: Front perineal area,Buttocks,Right lower leg,Left lower leg     Bathing assist Assist Level: Moderate Assistance - Patient 50 - 74%     Upper Body Dressing/Undressing Upper body dressing   What is the patient wearing?: Pull over shirt    Upper body assist Assist Level: Moderate Assistance - Patient 50 - 74%    Lower Body Dressing/Undressing Lower body dressing      What is the patient wearing?: Pants     Lower body assist Assist for lower body dressing: Total Assistance - Patient < 25%     Toileting Toileting    Toileting assist Assist for  toileting: Dependent - Patient 0%     Transfers Chair/bed transfer  Transfers assist     Chair/bed transfer assist level: Total Assistance - Patient < 25% (slide board)     Locomotion Ambulation   Ambulation assist   Ambulation activity did not occur: Safety/medical concerns          Walk 10 feet activity   Assist  Walk 10 feet activity did not occur: Safety/medical concerns        Walk 50 feet activity   Assist Walk 50 feet with 2 turns activity did not occur: Safety/medical concerns         Walk 150 feet activity   Assist Walk 150 feet activity did not  occur: Safety/medical concerns         Walk 10 feet on uneven surface  activity   Assist Walk 10 feet on uneven surfaces activity did not occur: Safety/medical concerns         Wheelchair     Assist Will patient use wheelchair at discharge?: Yes Type of Wheelchair: Power    Wheelchair assist level: Supervision/Verbal cueing Max wheelchair distance: 300'    Wheelchair 50 feet with 2 turns activity    Assist        Assist Level: Supervision/Verbal cueing   Wheelchair 150 feet activity     Assist      Assist Level: Supervision/Verbal cueing   Blood pressure 117/67, pulse 97, temperature 98.2 F (36.8 C), temperature source Oral, resp. rate 17, height 5\' 4"  (1.626 m), weight 83 kg, last menstrual period 11/22/2020, SpO2 94 %.   Medical Problem List and Plan: 1.  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  PE/Antithrombotics: -DVT/anticoagulation:  Pharmaceutical: Other (comment)--Eliquis             -antiplatelet therapy: N/A 3. Pain Management: Oxycodone 5mg  q 6 h prn, also  Gabapentin 300mg  BID - increase to TID  Increased gabapentin to 400 mg QID   5/24- will increase Gabapentin to 600 mg QID; will also add Duloxetine 30 mg QHS for pain- if tolerates, will increase to 60 mg and STOP Lexapro at that time  5/26- pain ~10% better- con't regimen- explained usually takes ~ 1 week to notice improvement with Duloxetine.   5/27-28- pain stable- using lidocaine ointment on hands- con't regimen              4. Mood: LCSW to follow for evaluation and support. Team to offer ego support.   Increased Lexapro to 20 mg daily on 5/16  5/24- as above, will stop lexapro once increase duloxetine, if tolerates- will also add Buspar for anxiety 5 mg TID.   5/26- mood  about the same- con't regimen  5/27-  pt seen by neuropsych               -antipsychotic agents: N/A 5. Neuropsych: This patient is capable of making decisions on her own behalf. 6. Skin/Wound Care:              Prevalon boots for use when in bed. Continue left hand splint.              --Darco shoe for weight bearing per PT input.   5/18- approved to do silvadene and wound care for feet  as well as PRAFOs for ankles- to wear at night.  5/23- pt likes to wear PRAFOs- explained can wear them any time not in therapy  5/24- asked PT to have OT make L hand splint to help formation of contractures of L hand. Will reinforce to OT when see her.   5/26- has L hand splint/brace- wore last night- con't regimen-will place tegaderm for L wrist scab  5/27- added Santyl to R ankle and stopped tegaderm  7. Fluids/Electrolytes/Nutrition: Monitor  I/Os             Added protein  supplements to help promote wound healing.  8.  PAF: Monitor HR tid--continue Metoprolol and Cardizem for rate control.             Vitals:   12/07/20 0029 12/07/20 0413  BP: 101/68 117/67  Pulse: 82 97  Resp:  17  Temp:  98.2 F (36.8 C)  SpO2:  94%   5/26- BP soft- will d/w PT if dropping BP?  5/28 bp's still soft, no sx today 9. Dysphagia: Continue aspiration precautions--No straws.              Advance diet as tolerated  5/23- SLP still seeing- con't regimen  Intermittent supervision 10. ABLA              Hemoglobin 11.3 on 5/16, labs ordered for tomorrow  5/24- Hb 11.0- con't regimen  11. Forming contractures- DIPs B/L and ankles  Continue PRAFOs nightly also will try and get some "arthritis gloves" for hands to help with forming DIP contractures?  5/23-  con't PRAFOs  5/24- will get resting hand splint made for L hand due to worsening contractures.   5/26- wearing L hand splint/brace- mild improvement in finger curling/contracture formation.   5/27-28- con't regimen, encouraged ROM as possible as well 12. Loose  stools  Improved     LOS: 15 days A FACE TO FACE EVALUATION WAS PERFORMED  Ranelle Oyster 12/07/2020, 12:08 PM

## 2020-12-08 NOTE — Progress Notes (Signed)
Speech Language Pathology Daily Session Note  Patient Details  Name: [name] [name] MRN: [id number] Date of Birth: [birth year]  Today's Date: 12/08/2020 SLP Individual Time: 1300-1330 SLP Individual Time Calculation (min): 30 min  Short Term Goals: Week 2: SLP Short Term Goal 1 (Week 2): Pt will demonstrate complex problem solving in functional task (ex: medication/money/time management) with supervison A verbal cues. SLP Short Term Goal 2 (Week 2): Pt will demonstrate alternating attention during complex tasks in 10 minute intervals with Mod I verbal cues. SLP Short Term Goal 3 (Week 2): Pt will demonstrate recall of novel, daily information with Mod I verbal cues. SLP Short Term Goal 4 (Week 2): Pt will participate in higher level word finding task (ex: divergent and convergent naming) with supervision A semantic cues.  Skilled Therapeutic Interventions: Pt seen for skilled ST with focus on cognitive goals, pt husband present throughout. Pt agreeable to tasks with encouragement, emotional and mildly agitated during session regarding medical status, pain and missing granddaughters high school graduation next weekend. SLP facilitating basic word finding task by providing mod A verbal cues for 85% accuracy. Pt becoming frustrated at time stating "I think my brain is fried" and "this is supposed to be child's work" when having difficulties. Pt and husband educated on compensatory strategies, goals of tx and ways to reduce frustration during tasks. SLP encouraging husband to bring in tablet that can be propped up so patient can utilize stylus to do Candy Crush, puzzles and other cognitively stimulating activities she was doing previously. Pt left in bed with spouse present and alarm set. Cont ST POC.   Pain Pain Assessment Pain Scale: 0-10 Pain Score: 0-No pain Pain Type: Acute pain Pain Location: Hand Pain Descriptors / Indicators: Aching Pain Frequency: Constant Pain Onset: On-going Pain  Intervention(s): Medication (See eMAR)  Therapy/Group: Individual Therapy  Tacey Ruiz 12/08/2020, 1:20 PM

## 2020-12-09 DIAGNOSIS — R5381 Other malaise: Secondary | ICD-10-CM | POA: Diagnosis not present

## 2020-12-09 LAB — CBC
HCT: 34.3 % — ABNORMAL LOW (ref 36.0–46.0)
Hemoglobin: 10.9 g/dL — ABNORMAL LOW (ref 12.0–15.0)
MCH: 30.6 pg (ref 26.0–34.0)
MCHC: 31.8 g/dL (ref 30.0–36.0)
MCV: 96.3 fL (ref 80.0–100.0)
Platelets: 311 10*3/uL (ref 150–400)
RBC: 3.56 MIL/uL — ABNORMAL LOW (ref 3.87–5.11)
RDW: 16 % — ABNORMAL HIGH (ref 11.5–15.5)
WBC: 7.1 10*3/uL (ref 4.0–10.5)
nRBC: 0 % (ref 0.0–0.2)

## 2020-12-09 LAB — BASIC METABOLIC PANEL
Anion gap: 9 (ref 5–15)
BUN: 7 mg/dL (ref 6–20)
CO2: 26 mmol/L (ref 22–32)
Calcium: 9 mg/dL (ref 8.9–10.3)
Chloride: 100 mmol/L (ref 98–111)
Creatinine, Ser: 0.4 mg/dL — ABNORMAL LOW (ref 0.44–1.00)
GFR, Estimated: >60 mL/min (ref >60)
Glucose, Bld: 108 mg/dL — ABNORMAL HIGH (ref 70–99)
Potassium: 3.8 mmol/L (ref 3.5–5.1)
Sodium: 135 mmol/L (ref 135–145)

## 2020-12-09 MED ORDER — DILTIAZEM HCL 60 MG PO TABS
60.0000 mg | ORAL_TABLET | Freq: Two times a day (BID) | ORAL | Status: DC
  Administered 2020-12-09 – 2020-12-13 (×8): 60 mg via ORAL
  Filled 2020-12-09 (×8): qty 1

## 2020-12-09 MED ORDER — DULOXETINE HCL 60 MG PO CPEP
60.0000 mg | ORAL_CAPSULE | Freq: Every day | ORAL | Status: DC
  Administered 2020-12-09 – 2020-12-22 (×14): 60 mg via ORAL
  Filled 2020-12-09 (×14): qty 1

## 2020-12-09 MED ORDER — ESCITALOPRAM OXALATE 10 MG PO TABS
10.0000 mg | ORAL_TABLET | Freq: Every day | ORAL | Status: DC
  Administered 2020-12-10 – 2020-12-23 (×14): 10 mg via ORAL
  Filled 2020-12-09 (×17): qty 1

## 2020-12-09 NOTE — Progress Notes (Signed)
Speech Language Pathology Weekly Progress and Session Note  Patient Details  Name: [name] [name] MRN: [id number] Date of Birth: [birth year]  Beginning of progress report period: Dec 02, 2020 End of progress report period: Dec 09, 2020  Today's Date: 12/09/2020 SLP Individual Time: 1345-1430 SLP Individual Time Calculation (min): 45 min  Short Term Goals: Week 2: SLP Short Term Goal 1 (Week 2): Pt will demonstrate complex problem solving in functional task (ex: medication/money/time management) with supervison A verbal cues. SLP Short Term Goal 1 - Progress (Week 2): Revised due to lack of progress SLP Short Term Goal 2 (Week 2): Pt will demonstrate alternating attention during complex tasks in 10 minute intervals with Mod I verbal cues. SLP Short Term Goal 2 - Progress (Week 2): Progressing toward goal SLP Short Term Goal 3 (Week 2): Pt will demonstrate recall of novel, daily information with Mod I verbal cues. SLP Short Term Goal 3 - Progress (Week 2): Progressing toward goal SLP Short Term Goal 4 (Week 2): Pt will participate in higher level word finding task (ex: divergent and convergent naming) with supervision A semantic cues. SLP Short Term Goal 4 - Progress (Week 2): Progressing toward goal    New Short Term Goals: Week 3: SLP Short Term Goal 1 (Week 3): Pt will demonstrate mildly complex problem solving in functional task (ex: medication/money/time management) with supervison A verbal cues. SLP Short Term Goal 2 (Week 3): Pt will demonstrate alternating attention during complex tasks in 10 minute intervals with Mod I verbal cues. SLP Short Term Goal 3 (Week 3): Pt will participate in higher level word finding task (ex: divergent and convergent naming) with supervision A semantic cues. SLP Short Term Goal 4 (Week 3): Pt will demonstrate recall of novel, daily information with Mod I verbal cues.  Weekly Progress Updates: Pt is making slow progress with goals for cognition. She is  currently min A for mildly complex cognitive tasks and min A for word finding. She demonstrates improvement in understadning of strategies for word finding but continues to demonstrate moments of frustration with word finding at conversational level. Pt education is ongoing on word finding strategies and strategies to increase attention in conversation and with structured tasks. She will benefit from continued SLP intervention to maximize problem solving and word finding and overall functional independence prior to discharge.      Intensity: Minumum of 1-2 x/day, 30 to 90 minutes Frequency: 1 to 3 out of 7 days Duration/Length of Stay: 3-4 weeks Treatment/Interventions: Cognitive remediation/compensation;Cueing hierarchy;Internal/external aids;Functional tasks;Medication managment;Patient/family education;Speech/Language facilitation   Daily Session  Skilled Therapeutic Interventions: Skilled slp intervention focused on cognition. She completed verbal problem solving task requiring word finding skills. Pt named food and place for letters A-S with mod A for recall of categories and descriptive cues to increase word finding. Word description task completed with min A verbal cues for relevant details. Cont with therapy per plan of care.     General    Pain Pain Assessment Pain Scale: Faces Faces Pain Scale: Hurts whole lot Pain Location: Hand (hands and feet) Pain Descriptors / Indicators: Aching Pain Onset: On-going  Therapy/Group: Individual Therapy  Carlean Jews Ori Trejos 12/09/2020, 2:27 PM

## 2020-12-09 NOTE — Progress Notes (Signed)
Physical Therapy Session Note  Patient Details  Name: [name] [name] MRN: [id number] Date of Birth: [birth year]  Today's Date: 12/09/2020 PT Individual Time: 0518-3358 PT Individual Time Calculation (min): 60 min   Short Term Goals: Week 2:  PT Short Term Goal 1 (Week 2): pt to demonstrate STS with LRAD max A x1 PT Short Term Goal 2 (Week 2): pt to demonstrate dynamic sitting balance at supervision PT Short Term Goal 3 (Week 2): pt to initiate static standing at max A  Skilled Therapeutic Interventions/Progress Updates:   Pt received sitting in WC and agreeable to PT.   Power WC mobility in hall x 246f, through hospital atrium x 3031f and over cement sidewalk 2 x 2002fMin cues for hand position to improve control in turns and maintain straight path in hall.    Seated therex in WC. LAQ, ankle DF, hip abduction, hip adduction, hip flexion, hip extension. Each completed x [id number] cues for end range hold x 3 sec. AAROM for ankle DF on the L and PROM on the RLE due to muscle atrophy.   Pt returned to room and performed SB transfer to bed with Max assist. Sit>supine completed with mod assist for control of BLE and left supine in bed with call bell in reach and all needs met.       Therapy Documentation Precautions:  Precautions Precautions: Fall Precaution Comments: Gangrenous changes in hands Rt>Lt, open foot wounds bilaterally Restrictions Weight Bearing Restrictions: Yes RLE Weight Bearing: Non weight bearing LLE Weight Bearing: Non weight bearing Other Position/Activity Restrictions: WBAT B LEs while wearing compression socks + Darco shoes    Pain: denies   Therapy/Group: Individual Therapy  AustLorie Phenix0/2022, 10:40 AM

## 2020-12-09 NOTE — Progress Notes (Signed)
Physical Therapy Session Note  Patient Details  Name: [name] [name] MRN: [id number] Date of Birth: 1961-05-25  Today's Date: 12/09/2020 PT Individual Time: 0800-0827 PT Individual Time Calculation (min): 27 min   Short Term Goals: Week 2:  PT Short Term Goal 1 (Week 2): pt to demonstrate STS with LRAD max A x1 PT Short Term Goal 2 (Week 2): pt to demonstrate dynamic sitting balance at supervision PT Short Term Goal 3 (Week 2): pt to initiate static standing at max A  Skilled Therapeutic Interventions/Progress Updates:    Patient received sitting up in bed, agreeable to PT. She reports 5/10 pain in B hands, premedicated. PT providing rest breaks, distractions, repositioning and ice to assist with pain management. Patient declining to participate in OOB therapy or EOB therapy due to pain and fatigue, also citing "busy day ahead." Patient completing morning ADLs in bed with set up assist. PT discussing non-pharmacological pain management techniques with patient to further assist with managing pain. PT emphasized importance of staying on top of pain management instead of "chasing the pain." Patient receptive to education. At end of session, patient remaining in bed, call light within reach.   Therapy Documentation Precautions:  Precautions Precautions: Fall Precaution Comments: Gangrenous changes in hands Rt>Lt, open foot wounds bilaterally Restrictions Weight Bearing Restrictions: Yes RLE Weight Bearing: Non weight bearing LLE Weight Bearing: Non weight bearing Other Position/Activity Restrictions: WBAT B LEs while wearing compression socks + Darco shoes    Therapy/Group: Individual Therapy  Elizebeth Koller, PT, DPT, CBIS  12/09/2020, 7:46 AM

## 2020-12-09 NOTE — Progress Notes (Signed)
Occupational Therapy Session Note  Patient Details  Name: [name] [name] MRN: [id number] Date of Birth: [birth year]  Today's Date: 12/09/2020 OT Individual Time: 9562-1308 OT Individual Time Calculation (min): 55 min    Short Term Goals: Week 1:  OT Short Term Goal 1 (Week 1): Pt will sit EOB for ~5 minutes while engaged in an ADL task to increase upright tolerance OT Short Term Goal 1 - Progress (Week 1): Met OT Short Term Goal 2 (Week 1): Pt will complete LB dressing at sit<stand level with LRAD OT Short Term Goal 2 - Progress (Week 1): Not met OT Short Term Goal 3 (Week 1): Pt don an overhead shirt with no more than Mod A using adaptive strategies as needed OT Short Term Goal 3 - Progress (Week 1): Met Week 2:  OT Short Term Goal 1 (Week 2): Pt will complete slide board transfer with 1 helper in prep for ADL OOB OT Short Term Goal 2 (Week 2): Pt will complete UB dressing no more than Min A OT Short Term Goal 3 (Week 2): Pt will utilize emotional coping strategies with no more than Min cues consistently to increase participation OT Short Term Goal 4 (Week 2): Pt will perform LB dressing with Max A of 1   Skilled Therapeutic Interventions/Progress Updates:    Pt greeted at time of session supine in bed resting, willing to get OOB this am. Discussed options with Stedy, maxi, and slide board with pt wanting to use board. Donned pants bed level Max/total A rolling L/R with Min/Mod A. Supine .> sit Mod A with c/o lightheadedness, subsided quickly sitting EOB. Donned DARCO shoes dependent for time. Slide board bed > power chair with Max x2 d/t fear of falling and encouraging FWD weight shift. Anxiety limiting as well. Once in chair, rest break provided and pt repositioned hips. Pt directing care but also encouraged to use buttons/wheelchair features herself to recline/ adjust LEs. Set up at sink and brushed hair set up, therapist assist with two handed ponytail task. Pt set up in power chair  reclined for comfort and pressure relief, call bell in reach all needs met.    Therapy Documentation Precautions:  Precautions Precautions: Fall Precaution Comments: Gangrenous changes in hands Rt>Lt, open foot wounds bilaterally Restrictions Weight Bearing Restrictions: Yes RLE Weight Bearing: Non weight bearing LLE Weight Bearing: Non weight bearing Other Position/Activity Restrictions: WBAT B LEs while wearing compression socks + Darco shoes     Therapy/Group: Individual Therapy  [name] 12/09/2020, 7:09 AM

## 2020-12-09 NOTE — Progress Notes (Signed)
PROGRESS NOTE   Subjective/Complaints:  Per pt, R hand pain is still a "10/10".   Nursing concerned about low BP with diltiazem and Metoprolol- cannot stop either, but will reduce diltiazem to BID.   OT in room to start ADLs- they are looking at doing shower this week.   ROS:  Pt denies SOB, abd pain, CP, N/V/C/D, and vision changes     Objective:   No results found. Recent Labs    12/09/20 0441  WBC 7.1  HGB 10.9*  HCT 34.3*  PLT 311   Recent Labs    12/09/20 0012  NA 135  K 3.8  CL 100  CO2 26  GLUCOSE 108*  BUN 7  CREATININE 0.40*  CALCIUM 9.0    Intake/Output Summary (Last 24 hours) at 12/09/2020 1057 Last data filed at 12/09/2020 0850 Gross per 24 hour  Intake 1236 ml  Output 825 ml  Net 411 ml        Physical Exam: Vital Signs Blood pressure 108/64, pulse 72, temperature 98.3 F (36.8 C), resp. rate 20, height 5\' 4"  (1.626 m), weight 82.1 kg, last menstrual period 11/22/2020, SpO2 97 %.     General: awake, alert, appropriate, sitting up slightly in bed; NAD HENT: conjugate gaze; oropharynx moist CV: regular rate; no JVD Pulmonary: CTA B/L; no W/R/R- good air movement GI: soft, NT, ND, (+)BS Psychiatric: appropriate; less anxious Neurological: alert; STM deficits still noted Skin: .  Dry gangrene scattered fingers both hands- more on R hand; L hand actually looking much better Has lidocaine ointment on hands, dressings Gangrenous feet, toes---both dressed- softer on feet/no active bleeding seen this AM Musc: No edema in extremities.  No tenderness in extremities. Motor:  Motor: B/l UE: Shoulder abduction, elbow flexion/extention 4/5, hand limited to due pain, stable.  RLE: HF, KE 4/5, ADF 0/5 (  pain inhibition) LLE: HF, KE 4/5, ADF 2-/5 (  pain inhibition)   Assessment/Plan: 1. Functional deficits which require 3+ hours per day of interdisciplinary therapy in a comprehensive  inpatient rehab setting.  Physiatrist is providing close team supervision and 24 hour management of active medical problems listed below.  Physiatrist and rehab team continue to assess barriers to discharge/monitor patient progress toward functional and medical goals  Care Tool:  Bathing    Body parts bathed by patient: Right arm,Left arm,Chest,Abdomen,Face,Right upper leg,Left upper leg   Body parts bathed by helper: Front perineal area,Buttocks,Right lower leg,Left lower leg     Bathing assist Assist Level: Moderate Assistance - Patient 50 - 74%     Upper Body Dressing/Undressing Upper body dressing   What is the patient wearing?: Pull over shirt    Upper body assist Assist Level: Moderate Assistance - Patient 50 - 74%    Lower Body Dressing/Undressing Lower body dressing      What is the patient wearing?: Pants     Lower body assist Assist for lower body dressing: Total Assistance - Patient < 25%     Toileting Toileting    Toileting assist Assist for toileting: Dependent - Patient 0%     Transfers Chair/bed transfer  Transfers assist     Chair/bed transfer assist level:  Total Assistance - Patient < 25% (slide board)     Locomotion Ambulation   Ambulation assist   Ambulation activity did not occur: Safety/medical concerns          Walk 10 feet activity   Assist  Walk 10 feet activity did not occur: Safety/medical concerns        Walk 50 feet activity   Assist Walk 50 feet with 2 turns activity did not occur: Safety/medical concerns         Walk 150 feet activity   Assist Walk 150 feet activity did not occur: Safety/medical concerns         Walk 10 feet on uneven surface  activity   Assist Walk 10 feet on uneven surfaces activity did not occur: Safety/medical concerns         Wheelchair     Assist Will patient use wheelchair at discharge?: Yes Type of Wheelchair: Power    Wheelchair assist level:  Supervision/Verbal cueing Max wheelchair distance: 300'    Wheelchair 50 feet with 2 turns activity    Assist        Assist Level: Supervision/Verbal cueing   Wheelchair 150 feet activity     Assist      Assist Level: Supervision/Verbal cueing   Blood pressure 108/64, pulse 72, temperature 98.3 F (36.8 C), resp. rate 20, height 5\' 4"  (1.626 m), weight 82.1 kg, last menstrual period 11/22/2020, SpO2 97 %.   Medical Problem List and Plan: 1.  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  - PRAFOs at night and L hand brace 2.  PE/Antithrombotics: -DVT/anticoagulation:  Pharmaceutical: Other (comment)--Eliquis             -antiplatelet therapy: N/A 3. Pain Management: Oxycodone 5mg  q 6 h prn, also  Gabapentin 300mg  BID - increase to TID  Increased gabapentin to 400 mg QID   5/24- will increase Gabapentin to 600 mg QID; will also add Duloxetine 30 mg QHS for pain- if tolerates, will increase to 60 mg and STOP Lexapro at that time  5/30- increase Duloxetine to 60 mg QHS; to help - con't regimen 4. Mood: LCSW to follow for evaluation and support. Team to offer ego support.   Increased Lexapro to 20 mg daily on 5/16  5/24- as above, will stop lexapro once increase duloxetine, if tolerates- will also add Buspar for anxiety 5 mg TID.   5/26- mood about the same- con't regimen  5/27-  pt seen by neuropsych   5/30- will decrease lexapro to 10 mg daily since increased Duloxetine.               -antipsychotic agents: N/A 5. Neuropsych: This patient is capable of making decisions on her own behalf. 6. Skin/Wound Care:              Prevalon boots for use when in bed. Continue left hand splint.              --Darco shoe for weight bearing per PT input.   5/18- approved to do silvadene and wound care for feet as well as PRAFOs  for ankles- to wear at night.  5/23- pt likes to wear PRAFOs- explained can wear them any time not in therapy  5/24- asked PT to have OT make L hand splint to help formation of contractures  of L hand. Will reinforce to OT when see her.   5/26- has L hand splint/brace- wore last night- con't regimen-will place tegaderm for L wrist scab  5/27- added Santyl to R ankle and stopped tegaderm   5/30- no active bleeding on feet  this AM- con't regimen- shower this week- need to wrap LEs, not hands 7. Fluids/Electrolytes/Nutrition: Monitor  I/Os             Added protein  supplements to help promote wound healing.  8.  PAF: Monitor HR tid--continue Metoprolol and Cardizem for rate control.             Vitals:   12/09/20 0454 12/09/20 0505  BP: (!) 91/50 108/64  Pulse: 72   Resp: 20   Temp: 98.3 F (36.8 C)   SpO2: 97%      5/30- will decrease diltiazem to 60 mg BID 9. Dysphagia: Continue aspiration precautions--No straws.              Advance diet as tolerated  5/23- SLP still seeing- con't regimen  Intermittent supervision 10. ABLA              Hemoglobin 11.3 on 5/16, labs ordered for tomorrow  5/24- Hb 11.0- con't regimen  11. Forming contractures- DIPs B/L and ankles  Continue PRAFOs nightly also will try and get some "arthritis gloves" for hands to help with forming DIP contractures?  5/23-  con't PRAFOs  5/24- will get resting hand splint made for L hand due to worsening contractures.   5/26- wearing L hand splint/brace- mild improvement in finger curling/contracture formation.   5/27-28- con't regimen, encouraged ROM as possible as well 12. Loose stools  Improved     LOS: 17 days A FACE TO FACE EVALUATION WAS PERFORMED  Trey Bebee 12/09/2020, 10:57 AM

## 2020-12-10 DIAGNOSIS — R5381 Other malaise: Secondary | ICD-10-CM | POA: Diagnosis not present

## 2020-12-10 NOTE — Progress Notes (Signed)
Patient ID: [name] [name], female   DOB: [birth year], 60 y.o.   MRN: [id number] Team Conference Report to Patient/Family  Team Conference discussion was reviewed with the patient and caregiver, including goals, any changes in plan of care and target discharge date.  Patient and caregiver express understanding and are in agreement.  The patient has a target discharge date of 12/23/20.  Sw met with pt and called spouse at bedside. Provided conference updates. Pt states she prefers to d/c home with [facility] chair vs. Electric due to space. Spouse scheduled family education on Thursday 1-3 PM. Pt requesting to d/c home before Monday 6/13 (due to not receiving therapy on Monday 6/13)  [name] 12/10/2020, 12:48 PM

## 2020-12-10 NOTE — Progress Notes (Signed)
Occupational Therapy Session Note  Patient Details  Name: [name] [name] MRN: [id number] Date of Birth: 12-06-1960  Today's Date: 12/10/2020 OT Individual Time: 7035-0093 and 8182-9937 OT Individual Time Calculation (min): 75 min and 42 min   Short Term Goals: Week 1:  OT Short Term Goal 1 (Week 1): Pt will sit EOB for ~5 minutes while engaged in an ADL task to increase upright tolerance OT Short Term Goal 1 - Progress (Week 1): Met OT Short Term Goal 2 (Week 1): Pt will complete LB dressing at sit<stand level with LRAD OT Short Term Goal 2 - Progress (Week 1): Not met OT Short Term Goal 3 (Week 1): Pt don an overhead shirt with no more than Mod A using adaptive strategies as needed OT Short Term Goal 3 - Progress (Week 1): Met Week 2:  OT Short Term Goal 1 (Week 2): Pt will complete slide board transfer with 1 helper in prep for ADL OOB OT Short Term Goal 2 (Week 2): Pt will complete UB dressing no more than Min A OT Short Term Goal 3 (Week 2): Pt will utilize emotional coping strategies with no more than Min cues consistently to increase participation OT Short Term Goal 4 (Week 2): Pt will perform LB dressing with Max A of 1   Skilled Therapeutic Interventions/Progress Updates:    Session 1: Pt greeted at time of session supine in bed resting with Rec therapy. Hand off to OT. Discussion of AM sessions to improve recall and pt able to recall events with Mod assist for names of therapists. Supine > sit EOB from flat bed with Min A for trunk elevation. Total A for slide board placement, pt able to lateral lean to R side. Slide board > power chair with Max A of 1, 2nd helper for CGA for safety but pt able to assist significantly more this date compared to previous sessions. Discussion with pt after regarding DC planning, may be able to use SB at home instead of lift if continues to improve. Therapist assist driving power chair to sink, assisted with hair washing for the pt, pt able to perform  grooming tasks with brush and hair dryer Supervision. Occasional rest breaks for UEs. Set up in power chair with call bell in reach all needs met.   Session 2: Pt greeted at time of session sitting up in power chair from previous session. Therapist assist with driving chair to EOB. Total A for board placement, slide board Max of 1, 2nd helper providing Mod/Max as well back to bed, more assist needed than previous session. Pt noted to urinate part of the way through transfer unintentionally. Sit >supine Mod A for LEs and doffed shorts bed level, pt using urinal with therapist assist to hold for remaining urine. Set up for hygiene bed level with wash cloth for periarea. Therapist cleaning floor/area at this time d/t urine spillage during transfer. Remaining session focused on DC planning and PROM to L hand digits 3-5 at PIPs to tolerance. Call bell in reach all needs met.    Therapy Documentation Precautions:  Precautions Precautions: Fall Precaution Comments: Gangrenous changes in hands Rt>Lt, open foot wounds bilaterally Restrictions Weight Bearing Restrictions: Yes RLE Weight Bearing: Non weight bearing LLE Weight Bearing: Non weight bearing Other Position/Activity Restrictions: WBAT B LEs while wearing compression socks + Darco shoes     Therapy/Group: Individual Therapy  [name] 12/10/2020, 7:25 AM

## 2020-12-10 NOTE — Progress Notes (Signed)
Occupational Therapy Weekly Progress Note  Patient Details  Name: [name] [name] MRN: [id number] Date of Birth: 15-Feb-1961  Beginning of progress report period: Dec 02, 2020 End of progress report period: Dec 10, 2020   Patient has met 1 of 4 short term goals.  Pt is slowly progressing toward OT goals but is limited by inconsistent motivation, discomfort/pain in hands, and anxiety. Pt has performed slide board transfer to power chair with Max A of 1 but mostly needs 2+ assist for safety d/t fear of falling, Max A for LB dressing bed level, 2+ assist for sit <> stands in [place], and has been spending 2-3 hours up in chair and OOB at a time. Plan to continue DC planning and problem solve transfers, also to begin education and training with husband [name].   Patient continues to demonstrate the following deficits: muscle weakness, decreased cardiorespiratoy endurance, impaired timing and sequencing, unbalanced muscle activation, decreased coordination and decreased motor planning, decreased attention, decreased awareness, decreased problem solving, decreased safety awareness, decreased memory and delayed processing and decreased sitting balance, decreased standing balance, decreased postural control and decreased balance strategies and therefore will continue to benefit from skilled OT intervention to enhance overall performance with BADL and Reduce care partner burden.  Patient progressing toward long term goals..  Continue plan of care.  OT Short Term Goals Week 2:  OT Short Term Goal 1 (Week 2): Pt will complete slide board transfer with 1 helper in prep for ADL OOB OT Short Term Goal 1 - Progress (Week 2): Progressing toward goal OT Short Term Goal 2 (Week 2): Pt will complete UB dressing no more than Min A OT Short Term Goal 2 - Progress (Week 2): Progressing toward goal OT Short Term Goal 3 (Week 2): Pt will utilize emotional coping strategies with no more than Min cues consistently to increase  participation OT Short Term Goal 3 - Progress (Week 2): Progressing toward goal OT Short Term Goal 4 (Week 2): Pt will perform LB dressing with Max A of 1 OT Short Term Goal 4 - Progress (Week 2): Met Week 3:  OT Short Term Goal 1 (Week 3): Pt will utilize emotional coping strategies with no more than Min cues consistently to increase participation OT Short Term Goal 2 (Week 3): Pt will consistently perform slide board transfers w/ 1 assist in prep for ADL OT Short Term Goal 3 (Week 3): Pt will perform LB dress Mod A of 1 helper OT Short Term Goal 4 (Week 3): Pt will exhibit improved LUE PIP ROM by 5* each to decrease contracture strength    Therapy Documentation Precautions:  Precautions Precautions: Fall Precaution Comments: Gangrenous changes in hands Rt>Lt, open foot wounds bilaterally Restrictions Weight Bearing Restrictions: Yes RLE Weight Bearing: Non weight bearing LLE Weight Bearing: Non weight bearing Other Position/Activity Restrictions: WBAT B LEs while wearing compression socks + Darco shoes     Therapy/Group: Individual Therapy  [name] 12/10/2020, 12:36 PM

## 2020-12-10 NOTE — Evaluation (Signed)
Recreational Therapy Assessment and Plan  Patient Details  Name: [name] [name] MRN: [id number] Date of Birth: September 09, 1960 Today's Date: 12/10/2020  Rehab Potential:  Good ELOS:   d/c 6/13  Assessment Hospital Problem: Principal Problem:   Debility   Past Medical History:      Past Medical History:  Diagnosis Date  . Depression   . Generalized anxiety disorder   . NSTEMI (non-ST elevated myocardial infarction) [facility])    Past Surgical History:       Past Surgical History:  Procedure Laterality Date  . CARDIAC CATHETERIZATION  09/2020    Assessment & Plan Clinical Impression: Patient is a 60 year old female with history of HTN, psoriatic arthritis (anti-interleukin 17 A antibody), major depression with anxiety d/o who was admitted to [facility] on 09/21/2020 with septic shock due to strep pyogenes. History taken from chart review and patient. Hospital course significant for VDRF requiring intubation, acute renal failure s/p CRRT, diarrhea, NSTEMI, aspiration PNA, DIC with ischemia of fingers/BLE, PE, thrombocytopenia and anemia. She was treated with multiple antibiotics as well as IVIG, extubated to [facility] and remained NPO with tube feeds for nutritional support. . She continued to have leucocytosis with fever treated with Tygacil, flagyl and micafungin and was discharged to [facility] on 11/01/2020 for further management. She continued to have issues with leucocytosis and fevers with delirium. Antibiotics changed to IV Vanc, Flagyl and Cefepime on 04/24-->narrowed to cefepime and Flagyl on 04/27 X 2 weeks. Metoprolol titrated upwards and amiodarone weaned off per input from Dr. Doylene Canard. Klonopin added to help manage anxiety and diet slowly advanced to D3, thins--NO straws by 05/03. ID/Dr. Barth Kirks consulted for input due to SOB and fevers. CT of left foot and hand showed diffuse SQ edema without evidence of osteomyelitis. Dr. Sharol Given consulted for input on 05/05 and  recommended local care with Viva compression socks to be worn 24 hours and laundered daily. 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  Patient transferred to CIR on 11/22/2020 .   Pt presents with decreased activity tolerance, decreased functional mobility, decreased balance, difficulty maintaining precautions, feelings of anxiety Limiting pt's independence with leisure/community pursuits.   Plan  Min 1 TR session >20 minutes per week during LOS  Recommendations for other services: Neuropsych  Discharge Criteria: Patient will be discharged from TR if patient refuses treatment 3 consecutive times without medical reason.  If treatment goals not met, if there is a change in medical status, if patient makes no progress towards goals or if patient is discharged from hospital.  The above assessment, treatment plan, treatment alternatives and goals were discussed and mutually agreed upon: by patient  Friedens 12/10/2020, 3:51 PM

## 2020-12-10 NOTE — Progress Notes (Signed)
PROGRESS NOTE   Subjective/Complaints:  Pt reports "wasn't picking" at hand when seen this weekend- says she doesn't pick at them-   Also, said didn't do shower because "felt bad that day"- per OT, pt was possibly anxious about shower.  Pt also said Hand surgeon told her to not get hands wet- explained if need be, can use gloves on hands.   Said nails on L hand coming off-   ROS:   Pt denies SOB, abd pain, CP, N/V/C/D, and vision changes      Objective:   No results found. Recent Labs    12/09/20 0441  WBC 7.1  HGB 10.9*  HCT 34.3*  PLT 311   Recent Labs    12/09/20 0012  NA 135  K 3.8  CL 100  CO2 26  GLUCOSE 108*  BUN 7  CREATININE 0.40*  CALCIUM 9.0    Intake/Output Summary (Last 24 hours) at 12/10/2020 0855 Last data filed at 12/10/2020 0847 Gross per 24 hour  Intake 800 ml  Output 1300 ml  Net -500 ml        Physical Exam: Vital Signs Blood pressure 92/61, pulse 81, temperature 97.9 F (36.6 C), temperature source Oral, resp. rate 17, height 5\' 4"  (1.626 m), weight 85.7 kg, last menstrual period 11/22/2020, SpO2 99 %.      General: awake, alert, appropriate, laying supine in bed; got frustrated/upset about hearing about shower, etc;  NAD HENT: conjugate gaze; oropharynx moist CV: regular rate; no JVD Pulmonary: CTA B/L; no W/R/R- good air movement GI: soft, NT, ND, (+)BS Psychiatric: appropriate but upset about note "picking at hands" Neurological: alert- STM issues noted Skin: .  Dry gangrene scattered fingers both hands- more on R hand; L hand actually looking much better- but nails appear loose; R 2nd digit- has an area right above gangrene that looks irritated/a little more red- no drainage, but has a "bubble" of possible fluid that's tiny at demarcation edge,  Gangrenous feet, toes---both dressed- softer on feet/no active bleeding seen this AM Musc: No edema in extremities.  No  tenderness in extremities. Motor:  Motor: B/l UE: Shoulder abduction, elbow flexion/extention 4/5, hand limited to due pain, stable.  RLE: HF, KE 4/5, ADF 0/5 (  pain inhibition) LLE: HF, KE 4/5, ADF 2-/5 (  pain inhibition)   Assessment/Plan: 1. Functional deficits which require 3+ hours per day of interdisciplinary therapy in a comprehensive inpatient rehab setting.  Physiatrist is providing close team supervision and 24 hour management of active medical problems listed below.  Physiatrist and rehab team continue to assess barriers to discharge/monitor patient progress toward functional and medical goals  Care Tool:  Bathing    Body parts bathed by patient: Right arm,Left arm,Chest,Abdomen,Face,Right upper leg,Left upper leg   Body parts bathed by helper: Front perineal area,Buttocks,Right lower leg,Left lower leg     Bathing assist Assist Level: Moderate Assistance - Patient 50 - 74%     Upper Body Dressing/Undressing Upper body dressing   What is the patient wearing?: Pull over shirt    Upper body assist Assist Level: Moderate Assistance - Patient 50 - 74%    Lower Body Dressing/Undressing Lower  body dressing      What is the patient wearing?: Pants     Lower body assist Assist for lower body dressing: Total Assistance - Patient < 25%     Toileting Toileting    Toileting assist Assist for toileting: Dependent - Patient 0%     Transfers Chair/bed transfer  Transfers assist     Chair/bed transfer assist level: Total Assistance - Patient < 25% (slide board)     Locomotion Ambulation   Ambulation assist   Ambulation activity did not occur: Safety/medical concerns          Walk 10 feet activity   Assist  Walk 10 feet activity did not occur: Safety/medical concerns        Walk 50 feet activity   Assist Walk 50 feet with 2 turns activity did not occur: Safety/medical concerns         Walk 150 feet activity   Assist Walk 150 feet  activity did not occur: Safety/medical concerns         Walk 10 feet on uneven surface  activity   Assist Walk 10 feet on uneven surfaces activity did not occur: Safety/medical concerns         Wheelchair     Assist Will patient use wheelchair at discharge?: Yes Type of Wheelchair: Power    Wheelchair assist level: Supervision/Verbal cueing Max wheelchair distance: 300'    Wheelchair 50 feet with 2 turns activity    Assist        Assist Level: Supervision/Verbal cueing   Wheelchair 150 feet activity     Assist      Assist Level: Supervision/Verbal cueing   Blood pressure 92/61, pulse 81, temperature 97.9 F (36.6 C), temperature source Oral, resp. rate 17, height 5\' 4"  (1.626 m), weight 85.7 kg, last menstrual period 11/22/2020, SpO2 99 %.   Medical Problem List and Plan: 1.  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  PE/Antithrombotics: -DVT/anticoagulation:  Pharmaceutical: Other (comment)--Eliquis             -antiplatelet therapy: N/A 3. Pain Management: Oxycodone 5mg  q 6 h prn, also  Gabapentin 300mg  BID - increase to TID  Increased gabapentin to 400 mg QID   5/24- will increase Gabapentin to 600 mg QID; will also add Duloxetine 30 mg QHS for pain- if tolerates, will increase to 60 mg and STOP Lexapro at that time  5/30- increase Duloxetine to 60 mg QHS; to help - con't regimen 4. Mood: LCSW to follow for evaluation and support. Team to offer ego support.   Increased Lexapro to 20 mg daily on 5/16  5/24- as above, will stop lexapro once increase duloxetine, if tolerates- will also add Buspar for anxiety 5 mg TID.   5/26- mood about the same- con't regimen  5/27-  pt seen by neuropsych   5/30- will decrease lexapro to 10  mg daily since increased Duloxetine.               -antipsychotic agents: N/A 5. Neuropsych: This patient is capable of making decisions on her own behalf. 6. Skin/Wound Care:              Prevalon boots for use when in bed. Continue left hand splint.              --  Darco shoe for weight bearing per PT input.   5/18- approved to do silvadene and wound care for feet as well as PRAFOs for ankles- to wear at night.  5/23- pt likes to wear PRAFOs- explained can wear them any time not in therapy  5/24- asked PT to have OT make L hand splint to help formation of contractures of L hand. Will reinforce to OT when see her.   5/26- has L hand splint/brace- wore last night- con't regimen-will place tegaderm for L wrist scab  5/27- added Santyl to R ankle and stopped tegaderm   5/30- no active bleeding on feet  this AM- con't regimen- shower this week- need to wrap LEs, not hands  5/31- per pt was told to not get hands wet- so will have her gloves her hands in hospital gloves, nothing else.  7. Fluids/Electrolytes/Nutrition: Monitor  I/Os             Added protein  supplements to help promote wound healing.  8.  PAF: Monitor HR tid--continue Metoprolol and Cardizem for rate control.             Vitals:   12/09/20 1923 12/10/20 0458  BP: 97/63 92/61  Pulse: 76 81  Resp: 18 17  Temp: 98.4 F (36.9 C) 97.9 F (36.6 C)  SpO2: 94% 99%     5/30- will decrease diltiazem to 60 mg BID  5/31- BP still very soft- 92/61 this AM- con't to monitor-  9. Dysphagia: Continue aspiration precautions--No straws.              Advance diet as tolerated  5/23- SLP still seeing- con't regimen  Intermittent supervision 10. ABLA              Hemoglobin 11.3 on 5/16, labs ordered for tomorrow  5/24- Hb 11.0- con't regimen  11. Forming contractures- DIPs B/L and ankles  Continue PRAFOs nightly also will try and get some "arthritis gloves" for hands to help with forming DIP contractures?  5/23-  con't PRAFOs  5/24-  will get resting hand splint made for L hand due to worsening contractures.   5/26- wearing L hand splint/brace- mild improvement in finger curling/contracture formation.   5/27-28- con't regimen, encouraged ROM as possible as well  5/31- pt wearing L hand splint at night and PRAFOs nightly.  12. Loose stools  Improved     LOS: 18 days A FACE TO FACE EVALUATION WAS PERFORMED  [name] [name] 12/10/2020, 8:55 AM

## 2020-12-10 NOTE — Patient Care Conference (Signed)
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 Genice Rouge Care Coodinator Present: Kennyth Arnold, RN, BSN, CRRN;Christina Yachats, BSW Nurse Present: Kennyth Arnold, RN PT Present: Peter Congo, PT OT Present: Earleen Newport, OT SLP Present: Colin Benton, SLP PPS Coordinator present : Fae Pippin, SLP     Current Status/Progress Goal Weekly Team Focus  Bowel/Bladder   cont of Bowel and Bladder  will remain continent  assess q shift and PRN   Swallow/Nutrition/ Hydration             ADL's   slide board transfer x2 Max A, fear and anxiety limiting, LB dress bed level Max/total, Sitting balance Supervision, Sit <> stands in [place] Max/2 assist, bathing Mod A overall sitting EOB  Min A LB/toileting, Min transfers  sitting balance/core, emotional coping, general conditioning, ADL retraining, transfer training, sit <> stands   Mobility   mod A bed mobility, max A SB transfer, PWC mobility min A  CGA bed mob; min A-mod A transfers; supervision WC  pain management, bed mobility, transfers, PWC mobility   Communication   Min-Supervision A higher level word finding  Mod I  higher level word finding in conversation   Safety/Cognition/  Behavioral Observations  Min-Supervision A complex problem solving, supervision A recall and attention  Mod I  alternating attention, complex problem solving and recall   Pain   c/o pain to hand. Managed with pain medications.  less than 3  assess pain q shift and PRN   Skin   Necrotic fingers and toes.  Free from infection  assess skin q shift and PRN     Discharge Planning:  Discharging gome with spouse and sister to provide care 24/7   Team Discussion: Patient claims the hand surgeon said not to get hands wet but she can put gloves on. Nails on the left hand are coming loose. Continent B/B, has Oxy PRN for pain.  Patient on target to meet rehab goals: yes, bed level bathing, depends on motivation level. Slide board transfers are +2 assist. Will begin training family with a lift. Downgrading goals. Mod assist for bed mobility, tolerating up in power wheelchair more. SLP increasing with a goal of mod I  *See Care Plan and progress notes for long and short-term goals.   Revisions to Treatment Plan:  MD making medication adjustments.  Teaching Needs: Family education, medication management, pain management, skin/wound care, transfer training, lift training, gait training, balance training, endurance training, safety awareness.  Current Barriers to Discharge: Decreased caregiver support, Medical stability, Home enviroment access/layout, Wound care, Weight, Weight bearing restrictions, Medication compliance and Behavior  Possible Resolutions to Barriers: Continue current medications, provide emotional support.     Medical  Summary Current Status: gangrene in hands/fingers/feet/toes; pain still an issue- continent B/B; gangrene biggest limiter  Barriers to Discharge: Behavior;Decreased family/caregiver support;Home enviroment access/layout;Wound care;Weight bearing restrictions;Other (comments);Medical stability;Medication compliance;Weight  Barriers to Discharge Comments: going home with  husband/sister during day; B/L PRAFOs at night and L hand splint- B/L foot drop Possible Resolutions to [employer]: getting OOB more; SB transfer of 2 people still; move towards hoyer lift at home; tolerating w/c power- more- focus- time/wound healing and anxiety; increasing SLP to 5x/week; monitoring wounds closely.  d/c 6/13   Continued Need for Acute Rehabilitation Level of Care: The patient requires daily medical management by a physician with specialized training in physical medicine and rehabilitation for the following reasons: Direction of a multidisciplinary physical rehabilitation program to maximize functional independence : Yes Medical management of patient stability for increased activity during participation in an intensive rehabilitation regime.: Yes Analysis of laboratory values and/or radiology reports with any subsequent need for medication adjustment and/or medical intervention. : Yes   I attest that I was present, lead the team conference, and concur with the assessment and plan of the team.   Tennis Must 12/10/2020, 6:32 PM

## 2020-12-10 NOTE — Progress Notes (Signed)
Physical Therapy Session Note  Patient Details  Name: [name] [name] MRN: [id number] Date of Birth: 1961/05/13  Today's Date: 12/10/2020 PT Individual Time: 1130-1155 PT Individual Time Calculation (min): 25 min   Short Term Goals: Week 2:  PT Short Term Goal 1 (Week 2): pt to demonstrate STS with LRAD max A x1 PT Short Term Goal 2 (Week 2): pt to demonstrate dynamic sitting balance at supervision PT Short Term Goal 3 (Week 2): pt to initiate static standing at max A  Skilled Therapeutic Interventions/Progress Updates:    Patient received supine in bed, agreeable to bed level PT, citing fatigue. She reports pain in B hands, but did not rate- premedicated. PT providing rest breaks, distractions and repositioning to assist with pain management. She was able to come sit edge of bed with CGA and verbal cues for sequencing. Dynamic sitting balance edge of bed with ball catching within BOS and slightly outside BOS. Patient able to maintain good balance. Therex as follows: LAQ, marches, SLR, glute squeezes 2x12. She was able to return supine to return to bed and continue exercises with supervision. 4 rails, up, call light within reach.   Therapy Documentation Precautions:  Precautions Precautions: Fall Precaution Comments: Gangrenous changes in hands Rt>Lt, open foot wounds bilaterally Restrictions Weight Bearing Restrictions: Yes RLE Weight Bearing: Non weight bearing LLE Weight Bearing: Non weight bearing Other Position/Activity Restrictions: WBAT B LEs while wearing compression socks + Darco shoes    Therapy/Group: Individual Therapy  Elizebeth Koller, PT, DPT, CBIS  12/10/2020, 8:30 AM

## 2020-12-10 NOTE — Progress Notes (Signed)
Physical Therapy Session Note  Patient Details  Name: [name] [name] MRN: [id number] Date of Birth: 10-Apr-1961  Today's Date: 12/10/2020 PT Individual Time: 0900-1000 PT Individual Time Calculation (min): 60 min   Short Term Goals: Week 1:  PT Short Term Goal 1 (Week 1): Pt will perform bed mobility with mod assist PT Short Term Goal 1 - Progress (Week 1): Met PT Short Term Goal 2 (Week 1): Pt will perform SB transfer with max A +2 PT Short Term Goal 2 - Progress (Week 1): Met PT Short Term Goal 3 (Week 1): Pt will tolerate sitting in WC >2 hours between therapies PT Short Term Goal 3 - Progress (Week 1): Met PT Short Term Goal 4 (Week 1): Pt will perform sit>stand with +2 assist PT Short Term Goal 4 - Progress (Week 1): Not met Week 2:  PT Short Term Goal 1 (Week 2): pt to demonstrate STS with LRAD max A x1 PT Short Term Goal 2 (Week 2): pt to demonstrate dynamic sitting balance at supervision PT Short Term Goal 3 (Week 2): pt to initiate static standing at max A Week 3:     Skilled Therapeutic Interventions/Progress Updates:    PAIN Pt reports 2/5 pain in feet, 10/10 pain in hands but does not appear distressed and utilizes hands during session. Declines need for meds  Care taken w/all activities.  Therex: Supine hip abd/add x15 Hip and knee flexion /extension x15 TKEs x12 SLR partial rom x12  Dons pants w/max assist, rolls L/R w/cues and min to mod assist mult times for dressing. Supine to side to sit w/mod fading to min assist and heavy verbal cueing. Static w/supervision UE bilat overhead reach x 12 w/supervision Seated ball toss to self x 45 sec w/supervision bilat LE LAQs x 15  Therapist donned Darco Shoes for protection of feet.  Bed to wc via sliding board transfer w/mod assist of 2, pt w/improved boosting/increased use of LEs and improved ant wt shift.   Repositions using wc tilt features to offload and mod assist.  Sit to stand in Steady from elevated wc  repeated x 2 w/mod fading to min assist of 2.  Stands upright x 30-45 sec x 3 w/min assist.  Does not sit perched in Steady as this causes increased pressure to feet, returns to sitting in wc between standing efforts.  Pt transported back to room.  Pt left oob in wc w/alarm belt set and needs in reach   Therapy Documentation Precautions:  Precautions Precautions: Fall Precaution Comments: Gangrenous changes in hands Rt>Lt, open foot wounds bilaterally Restrictions Weight Bearing Restrictions: Yes RLE Weight Bearing: Non weight bearing LLE Weight Bearing: Non weight bearing Other Position/Activity Restrictions: WBAT B LEs while wearing compression socks + Darco shoes    Therapy/Group: Individual Therapy  Callie Fielding, [place] 12/10/2020, 12:19 PM

## 2020-12-11 DIAGNOSIS — R5381 Other malaise: Secondary | ICD-10-CM | POA: Diagnosis not present

## 2020-12-11 NOTE — Progress Notes (Signed)
Recreational Therapy Session Note  Patient Details  Name: [name] [name] MRN: [id number] Date of Birth: Nov 25, 1960 Today's Date: 12/11/2020  Pain:c/o burning pain in B hands, unrated.  RN present Skilled Therapeutic Interventions/Progress Updates: Pt performs power w/c mobility throughout unit with supervision.  Once in therapy gym, attempted sit-stand using STEDY with max cues for encouragement.  With standing attempt, pt weight shift forward on to Darco shoe increasing patient anxiety with standing.  Pt declined further standing attempts.  Shifted focus to weight shifting from w/c level to promote pressure relief skills as pt declines power w/c for home.  Pt sat in power chiar reaching outside BOS to play Connect Four game with supervision.  Therapy/Group: Co-Treatment Shyna Duignan 12/11/2020, 3:49 PM

## 2020-12-11 NOTE — Progress Notes (Signed)
Speech Language Pathology Daily Session Note  Patient Details  Name: [name] [name] MRN: [id number] Date of Birth: 03-05-1961  Today's Date: 12/11/2020 SLP Individual Time: 1345-1430 SLP Individual Time Calculation (min): 45 min  Short Term Goals: Week 3: SLP Short Term Goal 1 (Week 3): Pt will demonstrate mildly complex problem solving in functional task (ex: medication/money/time management) with supervison A verbal cues. SLP Short Term Goal 2 (Week 3): Pt will demonstrate alternating attention during complex tasks in 10 minute intervals with Mod I verbal cues. SLP Short Term Goal 3 (Week 3): Pt will participate in higher level word finding task (ex: divergent and convergent naming) with supervision A semantic cues. SLP Short Term Goal 4 (Week 3): Pt will demonstrate recall of novel, daily information with Mod I verbal cues.  Skilled Therapeutic Interventions: Skilled SLP intervention focused on cognition. Pt completed higher level word finding task and listed words or situations that were associated to a given word with min A verbal descriptive cues. Pt recalled written messages containing 3-4 details after a 2 min delay with min- supervision A verbal cues. She was able to recall 90% pf medication purpose when given the medication name with min A verbal cues. Improvement noted in processing speed and word finding during tasks this session. Cont with therapy per plan of care.      Pain Pain Assessment Pain Scale: Faces Faces Pain Scale: No hurt  Therapy/Group: Individual Therapy  [name] [name] [name] [name] 12/11/2020, 2:24 PM

## 2020-12-11 NOTE — Progress Notes (Signed)
Physical Therapy Session Note  Patient Details  Name: Lateefah Mallery MRN: [id number] Date of Birth: 1961-01-23  Today's Date: 12/11/2020 PT Individual Time: 1103-1200 PT Individual Time Calculation (min): 57 min   Short Term Goals: Week 2:  PT Short Term Goal 1 (Week 2): pt to demonstrate STS with LRAD max A x1 PT Short Term Goal 2 (Week 2): pt to demonstrate dynamic sitting balance at supervision PT Short Term Goal 3 (Week 2): pt to initiate static standing at max A  Skilled Therapeutic Interventions/Progress Updates:    Patient received sitting up PWC, agreeable to PT. She reports "burning pain" in B hands, R >L, but did not rate. RN present to admin pain rx. Patient driving herself in pwc with supervision. Attempted to stand into [place] with Max encouragement. Upon going to stand R Darco shoe slipped, minimally, on [place] platform and patients weight shifted anteriorly without patient expecting it to do so increasing her anxiety. After that she declined to continue practicing to stand in [place]. Patient driving herself in pwc to dayroom with supervision. She noted that she has declined to use a pwc at home. PT discussing importance of pressure relief schedule and weight shifting in chair to prevent development of skin breakdown. Patient minimally receptive to education. Dynamic sitting balance task completed playing connect 4 reaching outside BOS in wc to begin introducing idea of weight shifting in chair for pressure relief. Patient requesting to transfer back to bed via [facility]. 4 rails up, call light within reach.   Therapy Documentation Precautions:  Precautions Precautions: Fall Precaution Comments: Gangrenous changes in hands Rt>Lt, open foot wounds bilaterally Restrictions Weight Bearing Restrictions: Yes RLE Weight Bearing: Non weight bearing LLE Weight Bearing: Non weight bearing Other Position/Activity Restrictions: WBAT B LEs while wearing compression socks + Darco  shoes    Therapy/Group: Individual Therapy  Elizebeth Koller, PT, DPT, CBIS  12/11/2020, 8:21 AM

## 2020-12-11 NOTE — Progress Notes (Signed)
PROGRESS NOTE   Subjective/Complaints:  Pt reports stood x3 yesterday and did 4 SB transfers- doesn't want to use Hoyer.   Got hair washed by OT- feels better.    ROS:   Pt denies SOB, abd pain, CP, N/V/C/D, and vision changes       Objective:   No results found. Recent Labs    12/09/20 0441  WBC 7.1  HGB 10.9*  HCT 34.3*  PLT 311   Recent Labs    12/09/20 0012  NA 135  K 3.8  CL 100  CO2 26  GLUCOSE 108*  BUN 7  CREATININE 0.40*  CALCIUM 9.0    Intake/Output Summary (Last 24 hours) at 12/11/2020 0856 Last data filed at 12/11/2020 0745 Gross per 24 hour  Intake 600 ml  Output 900 ml  Net -300 ml        Physical Exam: Vital Signs Blood pressure 95/65, pulse 79, temperature 98.5 F (36.9 C), temperature source Oral, resp. rate 19, height 5\' 4"  (1.626 m), weight 82.6 kg, last menstrual period 11/22/2020, SpO2 100 %.       General: awake, alert, appropriate, laying supine in bed;  NAD HENT: conjugate gaze; oropharynx moist CV: regular rate; no JVD Pulmonary: CTA B/L; no W/R/R- good air movement GI: soft, NT, ND, (+)BS Psychiatric: appropriate today Neurological: alert; STM issues still noted- some word finding issues Skin: .  Dry gangrene scattered fingers both hands- more on R hand; L hand actually looking much better- but nails appear loose; R 2nd digit- has an area right above gangrene that looks irritated/a little more red- no drainage, but has a "bubble" still at demarcation- a little more irritated today, but not red/infectious appearing;  Gangrenous feet, toes---both dressed- softer on feet/no active bleeding seen this AM- L>R on great toe- less gangrene noted Musc: No edema in extremities.  No tenderness in extremities. Motor:  Motor: B/l UE: Shoulder abduction, elbow flexion/extention 4/5, hand limited to due pain, stable.  RLE: HF, KE 4/5, ADF 0/5 (  pain inhibition) LLE: HF, KE 4/5,  ADF 2-/5 (  pain inhibition)   Assessment/Plan: 1. Functional deficits which require 3+ hours per day of interdisciplinary therapy in a comprehensive inpatient rehab setting.  Physiatrist is providing close team supervision and 24 hour management of active medical problems listed below.  Physiatrist and rehab team continue to assess barriers to discharge/monitor patient progress toward functional and medical goals  Care Tool:  Bathing    Body parts bathed by patient: Right arm,Left arm,Chest,Abdomen,Face,Right upper leg,Left upper leg   Body parts bathed by helper: Front perineal area,Buttocks,Right lower leg,Left lower leg     Bathing assist Assist Level: Moderate Assistance - Patient 50 - 74%     Upper Body Dressing/Undressing Upper body dressing   What is the patient wearing?: Pull over shirt    Upper body assist Assist Level: Moderate Assistance - Patient 50 - 74%    Lower Body Dressing/Undressing Lower body dressing      What is the patient wearing?: Pants     Lower body assist Assist for lower body dressing: Total Assistance - Patient < 25%     Toileting Toileting  Toileting assist Assist for toileting: Dependent - Patient 0%     Transfers Chair/bed transfer  Transfers assist     Chair/bed transfer assist level: Maximal Assistance - Patient 25 - 49% (slide board)     Locomotion Ambulation   Ambulation assist   Ambulation activity did not occur: Safety/medical concerns          Walk 10 feet activity   Assist  Walk 10 feet activity did not occur: Safety/medical concerns        Walk 50 feet activity   Assist Walk 50 feet with 2 turns activity did not occur: Safety/medical concerns         Walk 150 feet activity   Assist Walk 150 feet activity did not occur: Safety/medical concerns         Walk 10 feet on uneven surface  activity   Assist Walk 10 feet on uneven surfaces activity did not occur: Safety/medical  concerns         Wheelchair     Assist Will patient use wheelchair at discharge?: Yes Type of Wheelchair: Power    Wheelchair assist level: Supervision/Verbal cueing Max wheelchair distance: 300'    Wheelchair 50 feet with 2 turns activity    Assist        Assist Level: Supervision/Verbal cueing   Wheelchair 150 feet activity     Assist      Assist Level: Supervision/Verbal cueing   Blood pressure 95/65, pulse 79, temperature 98.5 F (36.9 C), temperature source Oral, resp. rate 19, height 5\' 4"  (1.626 m), weight 82.6 kg, last menstrual period 11/22/2020, SpO2 100 %.   Medical Problem List and Plan: 1.  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   - PRAFOs at night and L hand brace 2.  PE/Antithrombotics: -DVT/anticoagulation:  Pharmaceutical: Other (comment)--Eliquis             -antiplatelet therapy: N/A 3. Pain Management: Oxycodone 5mg  q 6 h prn, also  Gabapentin 300mg  BID - increase to TID  Increased gabapentin to 400 mg QID   5/24- will increase Gabapentin to 600 mg QID; will also add Duloxetine 30 mg QHS for pain- if tolerates, will increase to 60 mg and STOP Lexapro at that time  5/30- increase Duloxetine to 60 mg QHS; to help - con't regimen 4. Mood: LCSW to follow for evaluation and support. Team to offer ego support.   Increased Lexapro to 20 mg daily on 5/16  5/24- as above, will stop lexapro once increase duloxetine, if tolerates- will also add Buspar for anxiety 5 mg TID.   5/26- mood about the same- con't regimen  5/27-  pt seen by neuropsych   5/30- will decrease lexapro to 10 mg daily since increased Duloxetine.               -antipsychotic agents: N/A 5. Neuropsych: This patient is capable of making decisions on her own  behalf. 6. Skin/Wound Care:              Prevalon boots for use when in bed. Continue left hand splint.              --Darco shoe for weight bearing per PT input.   5/18- approved to do silvadene and wound care for feet  as well as PRAFOs for ankles- to wear at night.  5/23- pt likes to wear PRAFOs- explained can wear them any time not in therapy  5/24- asked PT to have OT make L hand splint to help formation of contractures of L hand. Will reinforce to OT when see her.   5/26- has L hand splint/brace- wore last night- con't regimen-will place tegaderm for L wrist scab  5/27- added Santyl to R ankle and stopped tegaderm   5/30- no active bleeding on feet  this AM- con't regimen- shower this week- need to wrap LEs, not hands  5/31- per pt was told to not get hands wet- so will have her gloves her hands in hospital gloves, nothing else.   6/1- trying to arrange shower this week-con't regimen 7. Fluids/Electrolytes/Nutrition: Monitor  I/Os             Added protein  supplements to help promote wound healing.  8.  PAF: Monitor HR tid--continue Metoprolol and Cardizem for rate control.             Vitals:   12/10/20 2128 12/11/20 0542  BP: 112/60 95/65  Pulse:  79  Resp:  19  Temp:  98.5 F (36.9 C)  SpO2:  100%     5/30- will decrease diltiazem to 60 mg BID  5/31- BP still very soft- 92/61 this AM- con't to monitor-   6/1- BP better this AM 112/60- con't reigmen 9. Dysphagia: Continue aspiration precautions--No straws.              Advance diet as tolerated  5/23- SLP still seeing- con't regimen  Intermittent supervision 10. ABLA              Hemoglobin 11.3 on 5/16, labs ordered for tomorrow  5/24- Hb 11.0- con't regimen  11. Forming contractures- DIPs B/L and ankles  Continue PRAFOs nightly also will try and get some "arthritis gloves" for hands to help with forming DIP contractures?  5/23-  con't PRAFOs  5/24- will get resting hand splint made for L hand due to worsening  contractures.   5/26- wearing L hand splint/brace- mild improvement in finger curling/contracture formation.   5/27-28- con't regimen, encouraged ROM as possible as well  5/31- pt wearing L hand splint at night and PRAFOs nightly.  12. Loose stools  Improved     LOS: 19 days A FACE TO FACE EVALUATION WAS PERFORMED  Viktoriya Glaspy 12/11/2020, 8:56 AM

## 2020-12-11 NOTE — Progress Notes (Signed)
Occupational Therapy Session Note  Patient Details  Name: [name] [name] MRN: [id number] Date of Birth: [birth year]  Today's Date: 12/11/2020 OT Individual Time: 4801-6553 OT Individual Time Calculation (min): 58 min    Short Term Goals: Week 2:  OT Short Term Goal 1 (Week 2): Pt will complete slide board transfer with 1 helper in prep for ADL OOB OT Short Term Goal 1 - Progress (Week 2): Progressing toward goal OT Short Term Goal 2 (Week 2): Pt will complete UB dressing no more than Min A OT Short Term Goal 2 - Progress (Week 2): Progressing toward goal OT Short Term Goal 3 (Week 2): Pt will utilize emotional coping strategies with no more than Min cues consistently to increase participation OT Short Term Goal 3 - Progress (Week 2): Progressing toward goal OT Short Term Goal 4 (Week 2): Pt will perform LB dressing with Max A of 1 OT Short Term Goal 4 - Progress (Week 2): Met Week 3:  OT Short Term Goal 1 (Week 3): Pt will utilize emotional coping strategies with no more than Min cues consistently to increase participation OT Short Term Goal 2 (Week 3): Pt will consistently perform slide board transfers w/ 1 assist in prep for ADL OT Short Term Goal 3 (Week 3): Pt will perform LB dress Mod A of 1 helper OT Short Term Goal 4 (Week 3): Pt will exhibit improved LUE PIP ROM by 5* each to decrease contracture strength   Skilled Therapeutic Interventions/Progress Updates:    Pt greeted at time of session supine in bed resting agreeable to OT session. Agreeable to get OOB, donned pants first at bed level attempting to have pt raise HOB and bring BLEs to herself to thread but unable, assist to thread and pt rolling L/R to don over hips Max/total. Supine > sit Min A from flat bed for trunk elevation. Sitting EOB, lateral lean to R side for total A board placement and Mod/Max A of 1 for slide board transfer to power chair, Min A from second helper. Once in chair, having pt direct care and adjust seat  dimensions. Set up at sink and wash face/brush teeth with set up pt remembering techniques. Initiated discussion with the pt regarding power chair at home as she cannot do her own pressure relief, pt adamant she does not want power chair, slightly frustrated that we are pushing. Educated again on importance of pressure relief, continues to refuse power chair. Pt driving chair throughout hall > gym with Min A for backing up and avoiding obstacles. In gym, 2x20 ball hits with 1# bar progressing to 2# bar for UB and core strength. 2x10 ball pushes out for bimanual integration to promote anterior weight shift and weight bear through BLEs with [facility] shoes on step up. Back in room set up with call bell in reach, tilted back, all needs met.   Therapy Documentation Precautions:  Precautions Precautions: Fall Precaution Comments: Gangrenous changes in hands Rt>Lt, open foot wounds bilaterally Restrictions Weight Bearing Restrictions: Yes RLE Weight Bearing: Non weight bearing LLE Weight Bearing: Non weight bearing Other Position/Activity Restrictions: WBAT B LEs while wearing compression socks + Darco shoes     Therapy/Group: Individual Therapy  [name] 12/11/2020, 7:20 AM

## 2020-12-11 NOTE — Progress Notes (Signed)
Occupational Therapy Session Note  Patient Details  Name: [name] [name] MRN: [id number] Date of Birth: 1961-07-06  Today's Date: 12/11/2020 OT Individual Time: 1500-1530 OT Individual Time Calculation (min): 30 min    Short Term Goals: Week 1:  OT Short Term Goal 1 (Week 1): Pt will sit EOB for ~5 minutes while engaged in an ADL task to increase upright tolerance OT Short Term Goal 1 - Progress (Week 1): Met OT Short Term Goal 2 (Week 1): Pt will complete LB dressing at sit<stand level with LRAD OT Short Term Goal 2 - Progress (Week 1): Not met OT Short Term Goal 3 (Week 1): Pt don an overhead shirt with no more than Mod A using adaptive strategies as needed OT Short Term Goal 3 - Progress (Week 1): Met Week 2:  OT Short Term Goal 1 (Week 2): Pt will complete slide board transfer with 1 helper in prep for ADL OOB OT Short Term Goal 1 - Progress (Week 2): Progressing toward goal OT Short Term Goal 2 (Week 2): Pt will complete UB dressing no more than Min A OT Short Term Goal 2 - Progress (Week 2): Progressing toward goal OT Short Term Goal 3 (Week 2): Pt will utilize emotional coping strategies with no more than Min cues consistently to increase participation OT Short Term Goal 3 - Progress (Week 2): Progressing toward goal OT Short Term Goal 4 (Week 2): Pt will perform LB dressing with Max A of 1 OT Short Term Goal 4 - Progress (Week 2): Met Week 3:  OT Short Term Goal 1 (Week 3): Pt will utilize emotional coping strategies with no more than Min cues consistently to increase participation OT Short Term Goal 2 (Week 3): Pt will consistently perform slide board transfers w/ 1 assist in prep for ADL OT Short Term Goal 3 (Week 3): Pt will perform LB dress Mod A of 1 helper OT Short Term Goal 4 (Week 3): Pt will exhibit improved LUE PIP ROM by 5* each to decrease contracture strength  Skilled Therapeutic Interventions/Progress Updates:    Pt received in bed and consented to OT tx. Pt  agreeable to PROM stretches to L PIP joints and BUE strengthening HEP this session. Pt instructed in 2# dowel rod exercises while semifowler in bed including chest press and shoulder flexion for 3x15 with min cuing for proper technique with good carryover. Therapist lowered HOB, pt then instructed in cervical flexion and modified sit-ups for 3x10 for increased core engagement and strength for improved ADL and functional transfer independence. After tx, pt left semifowler in bed with all needs met.   Therapy Documentation Precautions:  Precautions Precautions: Fall Precaution Comments: Gangrenous changes in hands Rt>Lt, open foot wounds bilaterally Restrictions Weight Bearing Restrictions: Yes RLE Weight Bearing: Non weight bearing LLE Weight Bearing: Non weight bearing Other Position/Activity Restrictions: WBAT B LEs while wearing compression socks + Darco shoes    Pain: Pain Assessment Pain Scale: 0-10 Pain Score: 7  Faces Pain Scale: No hurt Pain Type: Acute pain Pain Location: Foot Pain Orientation: Right;Left Pain Descriptors / Indicators: Aching Pain Frequency: Constant Pain Onset: On-going Patients Stated Pain Goal: 3 Pain Intervention(s): Medication (See eMAR) (wound care)   Therapy/Group: Individual Therapy  Orlyn Odonoghue 12/11/2020, 3:44 PM

## 2020-12-12 DIAGNOSIS — R5381 Other malaise: Secondary | ICD-10-CM | POA: Diagnosis not present

## 2020-12-12 LAB — URINALYSIS, ROUTINE W REFLEX MICROSCOPIC
Bilirubin Urine: NEGATIVE
Glucose, UA: NEGATIVE mg/dL
Hgb urine dipstick: NEGATIVE
Ketones, ur: NEGATIVE mg/dL
Leukocytes,Ua: NEGATIVE
Nitrite: NEGATIVE
Protein, ur: NEGATIVE mg/dL
Specific Gravity, Urine: 1.004 — ABNORMAL LOW (ref 1.005–1.030)
pH: 7 (ref 5.0–8.0)

## 2020-12-12 NOTE — Progress Notes (Signed)
Speech Language Pathology Daily Session Note  Patient Details  Name: Merdis Snodgrass MRN: [id number] Date of Birth: 07-Jun-1961  Today's Date: 12/12/2020 SLP Individual Time: 1300-1330 SLP Individual Time Calculation (min): 30 min  Short Term Goals: Week 3: SLP Short Term Goal 1 (Week 3): Pt will demonstrate mildly complex problem solving in functional task (ex: medication/money/time management) with supervison A verbal cues. SLP Short Term Goal 2 (Week 3): Pt will demonstrate alternating attention during complex tasks in 10 minute intervals with Mod I verbal cues. SLP Short Term Goal 3 (Week 3): Pt will participate in higher level word finding task (ex: divergent and convergent naming) with supervision A semantic cues. SLP Short Term Goal 4 (Week 3): Pt will demonstrate recall of novel, daily information with Mod I verbal cues.  Skilled Therapeutic Interventions: Skilled SLP intervention focused on cognition and family education. Husband present during session and educated on level of assistance pt needs with cognitive tasks. Husband also educated on helpful word finding strategies to increase word retrieval in conversation. Husband given cognitive brain activities to download for patient to complete once home. Cont with therapy per plan of care.      Pain Pain Assessment Pain Scale: Faces Faces Pain Scale: Hurts little more Pain Location: Hand  Therapy/Group: Individual Therapy  Carlean Jews Lealon Vanputten 12/12/2020, 1:21 PM

## 2020-12-12 NOTE — Progress Notes (Signed)
Occupational Therapy Session Note  Patient Details  Name: [name] [name] MRN: [id number] Date of Birth: 1961-06-07  Today's Date: 12/12/2020 OT Individual Time: 8957-0220 and 2669-1675 OT Individual Time Calculation (min): 38 min and 57 min   Short Term Goals: Week 3:  OT Short Term Goal 1 (Week 3): Pt will utilize emotional coping strategies with no more than Min cues consistently to increase participation OT Short Term Goal 2 (Week 3): Pt will consistently perform slide board transfers w/ 1 assist in prep for ADL OT Short Term Goal 3 (Week 3): Pt will perform LB dress Mod A of 1 helper OT Short Term Goal 4 (Week 3): Pt will exhibit improved LUE PIP ROM by 5* each to decrease contracture strength   Skilled Therapeutic Interventions/Progress Updates:    Session 1: Pt greeted at time of session supine in bed resting agreeable to OT session, husband [name] present shortly after and remained throughout session. Focused on family training, hands on care with husband, and self care tasks. Supine > sit Min for trunk, sitting EOB for dependently donning DARCO shoes, sitting balance Supervision throughout bathing tasks with husband providing assist when needed. Pt using bathing mit for R hand to bathe. Donned new shirt Min A, returned to supine for shorts total A rolling L/R. Note husband providing hands on care for bathing and dressing tasks with Min/Mod overall. Husband also states he has helped pt use urinal as well many times and feels comfortable. Pt in bed resting call bell in reach all needs met.   Session 2: Pt greeted at time of session semireclined in bed with husband present, just finished PT session for family ed and pt emotional, support provided. Husband already assisted with ADL this am, focus of session on DC planning, discussing layout of home, DME needs, etc. Pt/husband had questions as well regarding transport, discussed community options for wheelchair transport vs having accessible  vehicle, did not like this option as it will be temporary until pt can transfer to regular car, will continue to problem solve. Pt/husband wanting to see manual hoyer, brought to room and demonstrated use for linking hooks and how to manipulate levers and hydrolics, both feeling better about hoyer for home. Pt declining slide board, but agreeable to stand in stedy. Supine > sit Min and sit <> stand in stedy Max of 1, able to stand for 15 seconds. Sit > supine and call bell in reach all needs met.   Therapy Documentation Precautions:  Precautions Precautions: Fall Precaution Comments: Gangrenous changes in hands Rt>Lt, open foot wounds bilaterally Restrictions Weight Bearing Restrictions: Yes RLE Weight Bearing: Non weight bearing LLE Weight Bearing: Non weight bearing Other Position/Activity Restrictions: WBAT B LEs while wearing compression socks + Darco shoes     Therapy/Group: Individual Therapy  [name] 12/12/2020, 7:16 AM

## 2020-12-12 NOTE — Progress Notes (Signed)
Physical Therapy Weekly Progress Note  Patient Details  Name: [name] [name] MRN: [id number] Date of Birth: 09-13-1960  Beginning of progress report period: Dec 03, 2020 End of progress report period: December 12, 2020  Today's Date: 12/12/2020 PT Individual Time: 1330-1415 PT Individual Time Calculation (min): 45 min   Patient has met 1 of 3 short term goals.  Pt continues to benefit from and require max A of 1-2 persons for Sit to stand in stedy for safety and unable to stand in stedy for more than 30s at a time 2/2 pain in B feet. Pt currently min A for bed mobility, max A of 1-2 for slide board transfers or total A for lift for bed<>chair transfers.   Patient continues to demonstrate the following deficits muscle weakness and muscle joint tightness, decreased cardiorespiratoy endurance and decreased sitting balance, decreased standing balance, decreased postural control and decreased balance strategies and therefore will continue to benefit from skilled PT intervention to increase functional independence with mobility.  Patient progressing toward long term goals..  Continue plan of care.  PT Short Term Goals Week 1:  PT Short Term Goal 1 (Week 1): Pt will perform bed mobility with mod assist PT Short Term Goal 1 - Progress (Week 1): Met PT Short Term Goal 2 (Week 1): Pt will perform SB transfer with max A +2 PT Short Term Goal 2 - Progress (Week 1): Met PT Short Term Goal 3 (Week 1): Pt will tolerate sitting in WC >2 hours between therapies PT Short Term Goal 3 - Progress (Week 1): Met PT Short Term Goal 4 (Week 1): Pt will perform sit>stand with +2 assist PT Short Term Goal 4 - Progress (Week 1): Not met Week 2:  PT Short Term Goal 1 (Week 2): pt to demonstrate STS with LRAD max A x1 PT Short Term Goal 1 - Progress (Week 2): Not met PT Short Term Goal 2 (Week 2): pt to demonstrate dynamic sitting balance at supervision PT Short Term Goal 2 - Progress (Week 2): Met PT Short Term Goal 3  (Week 2): pt to initiate static standing at max A PT Short Term Goal 3 - Progress (Week 2): Not met Week 3:  PT Short Term Goal 1 (Week 3): pt to demonstrate bed<>chair transfers a mod A of one with slide board PT Short Term Goal 2 (Week 3): pt to demonstrate WC mobility 10' in manual WC at min A PT Short Term Goal 3 (Week 3): pt to demonstrate STS max A of onewith LRAD  Skilled Therapeutic Interventions/Progress Updates:    pt received in bed and agreeable to therapy. Husband present for family education. Pt on bed pan with husband assisting for pericare post BM, min A for donning shorts over hips. Pt and husband had questions about equipment for home and current recommendations. Both educated on recommendation of hospital bed, manual hoyer lift, PWC vs. Manual WC, slide board. Pt reported she did not want a PWC for home with concerns about size and difficulty to transport. PT educated pt that the goal moving forward should be geared toward manual WC training and potentially a custom WC evaluation. Pt agreeable and her and husband requested to see a lightweight WC vs standard manual WC. PT retrieved these and brought to room, educated family and pt on both options, custom options and adjustments and if needed a vendor can do an eval for a WC and answer additional questions. Pt reports she would like to try the custom WC  and agreeable to transfer to the one brought to room. Pt setup for slide board with min A to come to bedside, total A for don wound care shoes, and and place board. Pt ultimately unable to participate in slide board transfer 2/2 pain in B feet and reporting she felt overwhelmed. Pt transferred back to supine min A and upward scooting mod A x2. Pt's husband educated on slide board transfer with visual demo by PT to show placement and technique. Pt's husband agreeable to come back for additional family education. Pt left in bed, All needs in reach and in good condition. Call light in hand.   Husband.  Therapy Documentation Precautions:  Precautions Precautions: Fall Precaution Comments: Gangrenous changes in hands Rt>Lt, open foot wounds bilaterally Restrictions Weight Bearing Restrictions: Yes RLE Weight Bearing: Non weight bearing LLE Weight Bearing: Non weight bearing Other Position/Activity Restrictions: WBAT B LEs while wearing compression socks + Darco shoes General:   Vital Signs: Therapy Vitals Temp: 98.3 F (36.8 C) Temp Source: Oral Pulse Rate: 66 Resp: 17 BP: 91/74 Patient Position (if appropriate): Lying Oxygen Therapy SpO2: 100 % O2 Device: Room Air Pain: Pain Assessment Pain Scale: 0-10 Pain Score: 8  Faces Pain Scale: Hurts little more Pain Type: Acute pain Pain Location: Hand Pain Orientation: Right;Left Pain Descriptors / Indicators: Aching Pain Frequency: Constant Pain Intervention(s): Medication (See eMAR) Vision/Perception     Mobility:   Locomotion :    Trunk/Postural Assessment :    Balance:   Exercises:   Other Treatments:     Therapy/Group: Individual Therapy  Junie Panning 12/12/2020, 3:51 PM

## 2020-12-12 NOTE — Consult Note (Signed)
WOC Nurse Consult Note: Reason for Consult: Clarification on wound care orders for BL feet. These wound care orders were placed by Dr. Berline Chough. Advised Bedside RN to clarify with Dr. Berline Chough since she was the ordering provider.  Thank you for the consult. WOC nurse will not follow at this time.   Please re-consult the WOC team if needed.  Renaldo Reel Katrinka Blazing, MSN, RN, CMSRN, Angus Seller, [facility] Treatment Associate Pager [phone]

## 2020-12-12 NOTE — Progress Notes (Signed)
PROGRESS NOTE   Subjective/Complaints:  Pt reports saw Dr Lajoyce Corners this AM- he doesn't want Silvadene anymore- it was also documented in his note. Placed changed to wound care orders.   Nerve pain meds have helped a lot- just not "completely".  But doesn't want to change the dose right now/decrease them.   Having a new vaginal discharge- :ight /weak tea colored/brownish.  No period anymore- No itching or dysuria.  No odor either.    ROS:   Pt denies SOB, abd pain, CP, N/V/C/D, and vision changes       Objective:   No results found. No results for input(s): WBC, HGB, HCT, PLT in the last 72 hours. No results for input(s): NA, K, CL, CO2, GLUCOSE, BUN, CREATININE, CALCIUM in the last 72 hours.  Intake/Output Summary (Last 24 hours) at 12/12/2020 0856 Last data filed at 12/12/2020 0740 Gross per 24 hour  Intake 400 ml  Output 1302 ml  Net -902 ml        Physical Exam: Vital Signs Blood pressure 104/63, pulse 80, temperature 98.4 F (36.9 C), resp. rate 16, height 5\' 4"  (1.626 m), weight 85.3 kg, last menstrual period 11/22/2020, SpO2 94 %.        General: awake, alert, appropriate, sitting up in bed; smiling; NAD HENT: conjugate gaze; oropharynx moist CV: regular rate; no JVD Pulmonary: CTA B/L; no W/R/R- good air movement GI: soft, NT, ND, (+)BS Psychiatric: appropriate- brighter today Neurological: alert, STM issues and occ word finding issues.  Skin: . Gangrene noted in finger tips still- R 2nd digit- worst- the "bubble" is better- L hand fingernails coming off/loose Feet- 2nd L digit and R 1st digit actively oozing blood- but look MUCH better- granulating underneath sheding gangrenous changes. Looking much better  Musc: No edema in extremities.  No tenderness in extremities. Motor:  Motor: B/l UE: Shoulder abduction, elbow flexion/extention 4/5, hand limited to due pain, stable.  RLE: HF, KE 4/5, ADF 0/5  (  pain inhibition) LLE: HF, KE 4/5, ADF 2-/5 (  pain inhibition)   Assessment/Plan: 1. Functional deficits which require 3+ hours per day of interdisciplinary therapy in a comprehensive inpatient rehab setting.  Physiatrist is providing close team supervision and 24 hour management of active medical problems listed below.  Physiatrist and rehab team continue to assess barriers to discharge/monitor patient progress toward functional and medical goals  Care Tool:  Bathing    Body parts bathed by patient: Right arm,Left arm,Chest,Abdomen,Face,Right upper leg,Left upper leg   Body parts bathed by helper: Front perineal area,Buttocks,Right lower leg,Left lower leg     Bathing assist Assist Level: Moderate Assistance - Patient 50 - 74%     Upper Body Dressing/Undressing Upper body dressing   What is the patient wearing?: Pull over shirt    Upper body assist Assist Level: Moderate Assistance - Patient 50 - 74%    Lower Body Dressing/Undressing Lower body dressing      What is the patient wearing?: Pants     Lower body assist Assist for lower body dressing: Total Assistance - Patient < 25%     Toileting Toileting    Toileting assist Assist for toileting: Dependent -  Patient 0%     Transfers Chair/bed transfer  Transfers assist     Chair/bed transfer assist level: Maximal Assistance - Patient 25 - 49% (slide board)     Locomotion Ambulation   Ambulation assist   Ambulation activity did not occur: Safety/medical concerns          Walk 10 feet activity   Assist  Walk 10 feet activity did not occur: Safety/medical concerns        Walk 50 feet activity   Assist Walk 50 feet with 2 turns activity did not occur: Safety/medical concerns         Walk 150 feet activity   Assist Walk 150 feet activity did not occur: Safety/medical concerns         Walk 10 feet on uneven surface  activity   Assist Walk 10 feet on uneven surfaces activity  did not occur: Safety/medical concerns         Wheelchair     Assist Will patient use wheelchair at discharge?: Yes Type of Wheelchair: Power    Wheelchair assist level: Supervision/Verbal cueing Max wheelchair distance: 300'    Wheelchair 50 feet with 2 turns activity    Assist        Assist Level: Supervision/Verbal cueing   Wheelchair 150 feet activity     Assist      Assist Level: Supervision/Verbal cueing   Blood pressure 104/63, pulse 80, temperature 98.4 F (36.9 C), resp. rate 16, height 5\' 4"  (1.626 m), weight 85.3 kg, last menstrual period 11/22/2020, SpO2 94 %.   Medical Problem List and Plan: 1.  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  -con't PT and OT   - PRAFOs at night and L hand brace 2.  PE/Antithrombotics: -DVT/anticoagulation:  Pharmaceutical: Other (comment)--Eliquis             -antiplatelet therapy: N/A 3. Pain Management: Oxycodone 5mg  q 6 h prn, also  Gabapentin 300mg  BID - increase to TID  Increased gabapentin to 400 mg QID   5/24- will increase Gabapentin to 600 mg QID; will also add Duloxetine 30 mg QHS for pain- if tolerates, will increase to 60 mg and STOP Lexapro at that time  5/30- increase Duloxetine to 60 mg QHS; to help - con't regimen  6/2- doesn't want to change regimen right now- con't meds 4. Mood: LCSW to follow for evaluation and support. Team to offer ego support.   Increased Lexapro to 20 mg daily on 5/16  5/24- as above, will stop lexapro once increase duloxetine, if tolerates- will also add Buspar for anxiety 5 mg TID.   5/26- mood about the same- con't regimen  5/27-  pt seen by neuropsych   5/30- will decrease lexapro to 10 mg daily since increased Duloxetine.  6/2- improved mood today after  seeing Dr 6/27-               -antipsychotic agents: N/A 5. Neuropsych: This patient is capable of making decisions on her own behalf. 6. Skin/Wound Care:              Prevalon boots for use when in bed. Continue left hand splint.              --Darco shoe for weight bearing  per PT input.   5/18- approved to do silvadene and wound care for feet as well as PRAFOs for ankles- to wear at night.  5/23- pt likes to wear PRAFOs- explained can wear them any time not in therapy  5/24- asked PT to have OT make L hand splint to help formation of contractures of L hand. Will reinforce to OT when see her.   5/26- has L hand splint/brace- wore last night- con't regimen-will place tegaderm for L wrist scab  6/1- trying to arrange shower this week-con't regimen  6/2- can wear hospital gloves for shower; changed dressings to dry, stop silvadene;  7. Fluids/Electrolytes/Nutrition: Monitor  I/Os             Added protein  supplements to help promote wound healing.  8.  PAF: Monitor HR tid--continue Metoprolol and Cardizem for rate control.             Vitals:   12/11/20 2216 12/12/20 0558  BP:  104/63  Pulse:  80  Resp:  16  Temp:  98.4 F (36.9 C)  SpO2: 98% 94%     5/30- will decrease diltiazem to 60 mg BID  5/31- BP still very soft- 92/61 this AM- con't to monitor-   6/1- BP better this AM 112/60- con't regimen 9. Dysphagia: Continue aspiration precautions--No straws.              Advance diet as tolerated  5/23- SLP still seeing- con't regimen  Intermittent supervision 10. ABLA              Hemoglobin 11.3 on 5/16, labs ordered for tomorrow  5/24- Hb 11.0- con't regimen  11. Forming contractures- DIPs B/L and ankles  Continue PRAFOs nightly also will try and get some "arthritis gloves" for hands to help with forming DIP contractures?  5/23-  con't PRAFOs  5/24- will get resting hand splint made for L hand due to worsening contractures.   5/26- wearing L hand splint/brace- mild improvement in  finger curling/contracture formation.   5/27-28- con't regimen, encouraged ROM as possible as well  5/31- pt wearing L hand splint at night and PRAFOs nightly.  12. Loose stools  Improved 13. Vaginal discharge  6/2- will check U/A and Cx- and send off to see if has anything going on- might have yeast, but no itching?     LOS: 20 days A FACE TO FACE EVALUATION WAS PERFORMED  Danea Manter 12/12/2020, 8:56 AM

## 2020-12-12 NOTE — Progress Notes (Signed)
Patient ID: [name] [name], female   DOB: 04-15-1961, 60 y.o.   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  Examination her fingertips are demarcating well with good epithelialization around the ischemic gangrenous areas there is no cellulitis no flexor tenosynovitis no signs of infection.  Examination of her toe shows excellent improvement there is epithelialization beneath the eschar that has been removed.  There is good granulation tissue.  Patient may advance dry dressing and does not need further Silvadene dressing changes to her feet.  She may be weightbearing as tolerated on both lower extremities and postoperative shoes.  Patient could wear the Vive wear 20 to 30 mm compression stocking from [employer].  If patient wore the stockings she would not need any dressing changes.

## 2020-12-13 MED ORDER — DILTIAZEM HCL ER 60 MG PO CP12
60.0000 mg | ORAL_CAPSULE | Freq: Two times a day (BID) | ORAL | Status: DC
  Administered 2020-12-13 – 2020-12-23 (×19): 60 mg via ORAL
  Filled 2020-12-13 (×20): qty 1

## 2020-12-13 NOTE — Progress Notes (Signed)
Physical Therapy Session Note  Patient Details  Name: [name] [name] MRN: [id number] Date of Birth: 1960-10-20  Today's Date: 12/13/2020 PT Individual Time: 1000-1100 PT Individual Time Calculation (min): 60 min   Short Term Goals: Week 1:  PT Short Term Goal 1 (Week 1): Pt will perform bed mobility with mod assist PT Short Term Goal 1 - Progress (Week 1): Met PT Short Term Goal 2 (Week 1): Pt will perform SB transfer with max A +2 PT Short Term Goal 2 - Progress (Week 1): Met PT Short Term Goal 3 (Week 1): Pt will tolerate sitting in WC >2 hours between therapies PT Short Term Goal 3 - Progress (Week 1): Met PT Short Term Goal 4 (Week 1): Pt will perform sit>stand with +2 assist PT Short Term Goal 4 - Progress (Week 1): Not met Week 2:  PT Short Term Goal 1 (Week 2): pt to demonstrate STS with LRAD max A x1 PT Short Term Goal 1 - Progress (Week 2): Not met PT Short Term Goal 2 (Week 2): pt to demonstrate dynamic sitting balance at supervision PT Short Term Goal 2 - Progress (Week 2): Met PT Short Term Goal 3 (Week 2): pt to initiate static standing at max A PT Short Term Goal 3 - Progress (Week 2): Not met Week 3:  PT Short Term Goal 1 (Week 3): pt to demonstrate bed<>chair transfers a mod A of one with slide board PT Short Term Goal 2 (Week 3): pt to demonstrate WC mobility 10' in manual WC at min A PT Short Term Goal 3 (Week 3): pt to demonstrate STS max A of onewith LRAD Week 4:     Skilled Therapeutic Interventions/Progress Updates:    pt received in PWC and agreeable to therapy. Pt educated on importance of attempting manual WC and pt agreeable and directed in [place] mobility to gym supervision until aligning chair to mat table for slide board transfer which required max A to complete. Pt then setup for slide board transfer with pt requiring total A for board placement and then max A for sliding to mat table with noted difficulty with pushing from flooring in gym and poor traction.  Once sitting EOM pt benefited from extra time to decrease anxiety and allow to slide board transfer to manual WC. Pt setup with no skid assist placed under wound care shoes which assisted greatly and slide board transfer ultimately min A to manual WC. Pt directed in [place] mobility with this WC for 100' min A with mostly using palms of hands to propel wheels. PT then assisted rest of distance to room for energy. Pt educated on differences with power WC vs. Manual WC vs. Custom WC and pt continues to decline use of power WC for home. Pt does agree to attempting to setup custom [facility] consult for additional information but unsure if she wants custom vs. Standard manual WC. Pt then educated on pressure relief schedule and demonstrated visually how to complete this I in manual chair with lateral leans or have helper tilt chair. Pt verbalized understanding. Pt then requested to return to bed, mod A x1 for slide board back to bed and min A to return to supine. Pt left in bed, All needs in reach and in good condition. Call light in hand.    Therapy Documentation Precautions:  Precautions Precautions: Fall Precaution Comments: Gangrenous changes in hands Rt>Lt, open foot wounds bilaterally Restrictions Weight Bearing Restrictions: Yes RLE Weight Bearing: Non weight bearing LLE Weight Bearing:  Non weight bearing Other Position/Activity Restrictions: WBAT B LEs while wearing compression socks + Darco shoes General:   Vital Signs: Therapy Vitals Pulse Rate: 74 Resp: 16 BP: (!) 97/59 Patient Position (if appropriate): Lying Oxygen Therapy SpO2: 96 % O2 Device: Room Air Pain: Pain Assessment Pain Scale: 0-10 Pain Score: 6  Pain Type: Acute pain Pain Location: Hand Pain Orientation: Right;Left Pain Descriptors / Indicators: Aching Pain Intervention(s): Medication (See eMAR) Mobility:   Locomotion :    Trunk/Postural Assessment :    Balance:   Exercises:   Other Treatments:      Therapy/Group:  Individual Therapy  Junie Panning 12/13/2020, 3:53 PM

## 2020-12-13 NOTE — Progress Notes (Signed)
Occupational Therapy Session Note  Patient Details  Name: [name] [name] MRN: [id number] Date of Birth: 12/01/1960  Today's Date: 12/13/2020 OT Individual Time: 6962-9528 OT Individual Time Calculation (min): 77 min    Short Term Goals: Week 1:  OT Short Term Goal 1 (Week 1): Pt will sit EOB for ~5 minutes while engaged in an ADL task to increase upright tolerance OT Short Term Goal 1 - Progress (Week 1): Met OT Short Term Goal 2 (Week 1): Pt will complete LB dressing at sit<stand level with LRAD OT Short Term Goal 2 - Progress (Week 1): Not met OT Short Term Goal 3 (Week 1): Pt don an overhead shirt with no more than Mod A using adaptive strategies as needed OT Short Term Goal 3 - Progress (Week 1): Met Week 2:  OT Short Term Goal 1 (Week 2): Pt will complete slide board transfer with 1 helper in prep for ADL OOB OT Short Term Goal 1 - Progress (Week 2): Progressing toward goal OT Short Term Goal 2 (Week 2): Pt will complete UB dressing no more than Min A OT Short Term Goal 2 - Progress (Week 2): Progressing toward goal OT Short Term Goal 3 (Week 2): Pt will utilize emotional coping strategies with no more than Min cues consistently to increase participation OT Short Term Goal 3 - Progress (Week 2): Progressing toward goal OT Short Term Goal 4 (Week 2): Pt will perform LB dressing with Max A of 1 OT Short Term Goal 4 - Progress (Week 2): Met Week 3:  OT Short Term Goal 1 (Week 3): Pt will utilize emotional coping strategies with no more than Min cues consistently to increase participation OT Short Term Goal 2 (Week 3): Pt will consistently perform slide board transfers w/ 1 assist in prep for ADL OT Short Term Goal 3 (Week 3): Pt will perform LB dress Mod A of 1 helper OT Short Term Goal 4 (Week 3): Pt will exhibit improved LUE PIP ROM by 5* each to decrease contracture strength  Skilled Therapeutic Interventions/Progress Updates:    Pt received in bed and consented to OT tx. Pt seen  for morning ADLs this session. Pt instructed in LB bathing and dressing at bed level with mod A to wash lower legs and thoroughness for washing buttocks when rolling, mod A to don shorts in bed. Pt sat EOB with min A, total A to don darco shows EOB. Sitting balance SUP and trained in slide board transfer to power w/c with min-mod A x1 and mod cuing for anterior lean during scooting. Pt then completed UB bathing and dressing sitting sink side with increased time due to pain. LPN administered pain medication during session. After ADLs, pt drove power w/c down to gym with supervision, instructed in STS with Steady with max x1. Therapist educated pt on and demonstrated proper body mechanics with head/hips ratio, and importance of rocking forward when standing. Pt completed 2 stands in Steady with max A to come all the way up to standing, able to remain standing with CGA for ~10-15 seconds each time before needing extensive seated rest break. Pt then helped back to room, left up in power w/c with all needs met, call light in reach.   Therapy Documentation Precautions:  Precautions Precautions: Fall Precaution Comments: Gangrenous changes in hands Rt>Lt, open foot wounds bilaterally Restrictions Weight Bearing Restrictions: Yes RLE Weight Bearing: Non weight bearing LLE Weight Bearing: Non weight bearing Other Position/Activity Restrictions: WBAT B LEs while wearing compression  socks + Darco shoes Vital Signs: Therapy Vitals Temp: 98.3 F (36.8 C) Temp Source: Oral Pulse Rate: 83 Resp: 14 BP: 98/64 Patient Position (if appropriate): Lying Oxygen Therapy SpO2: 98 % O2 Device: Room Air Pain: 7/10     Therapy/Group: Individual Therapy  Prestyn Mahn 12/13/2020, 8:07 AM

## 2020-12-13 NOTE — Progress Notes (Signed)
PROGRESS NOTE   Subjective/Complaints:   Pt's U/A (-) for anything-  Pt reports using female urinal.   Reports L 5th digit's nail is "crumbling" - not staying together anymore.   No change in pain or issues.    ROS:   Pt denies SOB, abd pain, CP, N/V/C/D, and vision changes       Objective:   No results found. No results for input(s): WBC, HGB, HCT, PLT in the last 72 hours. No results for input(s): NA, K, CL, CO2, GLUCOSE, BUN, CREATININE, CALCIUM in the last 72 hours.  Intake/Output Summary (Last 24 hours) at 12/13/2020 0850 Last data filed at 12/13/2020 0755 Gross per 24 hour  Intake 560 ml  Output 800 ml  Net -240 ml        Physical Exam: Vital Signs Blood pressure 98/64, pulse 83, temperature 98.3 F (36.8 C), temperature source Oral, resp. rate 14, height 5\' 4"  (1.626 m), weight 82.6 kg, last menstrual period 11/22/2020, SpO2 98 %.         General: awake, alert, appropriate, smiling- sitting up in bed; NAD HENT: conjugate gaze; oropharynx moist CV: regular rate; no JVD Pulmonary: CTA B/L; no W/R/R- good air movement GI: soft, NT, ND, (+)BS- normoactive Psychiatric: appropriate- still anxious, but covers it "better" Neurological: alert- no word finding issues seen this AM; still STM issues Skin: . Gangrene noted in finger tips still- R 2nd digit- worst- the "bubble" is better- no signs of infection- L hand fingernails coming off/loose- esp 5th finger- nail coming apart.  Feet- 2nd L digit and R 1st digit actively oozing blood- but look MUCH better- granulating underneath sheding gangrenous changes. Looking much better  Musc: No edema in extremities.  No tenderness in extremities. Motor:  Motor: B/l UE: Shoulder abduction, elbow flexion/extention 4/5, hand limited to due pain, stable.  RLE: HF, KE 4/5, ADF 0/5 (  pain inhibition) LLE: HF, KE 4/5, ADF 2-/5 (  pain inhibition)    Assessment/Plan: 1. Functional deficits which require 3+ hours per day of interdisciplinary therapy in a comprehensive inpatient rehab setting.  Physiatrist is providing close team supervision and 24 hour management of active medical problems listed below.  Physiatrist and rehab team continue to assess barriers to discharge/monitor patient progress toward functional and medical goals  Care Tool:  Bathing    Body parts bathed by patient: Right arm,Left arm,Chest,Abdomen,Face,Right upper leg,Left upper leg   Body parts bathed by helper: Front perineal area,Buttocks,Right lower leg,Left lower leg     Bathing assist Assist Level: Moderate Assistance - Patient 50 - 74%     Upper Body Dressing/Undressing Upper body dressing   What is the patient wearing?: Pull over shirt    Upper body assist Assist Level: Minimal Assistance - Patient > 75%    Lower Body Dressing/Undressing Lower body dressing      What is the patient wearing?: Pants     Lower body assist Assist for lower body dressing: Total Assistance - Patient < 25%     Toileting Toileting    Toileting assist Assist for toileting: Dependent - Patient 0%     Transfers Chair/bed transfer  Transfers assist  Chair/bed transfer assist level: Maximal Assistance - Patient 25 - 49%     Locomotion Ambulation   Ambulation assist   Ambulation activity did not occur: Safety/medical concerns          Walk 10 feet activity   Assist  Walk 10 feet activity did not occur: Safety/medical concerns        Walk 50 feet activity   Assist Walk 50 feet with 2 turns activity did not occur: Safety/medical concerns         Walk 150 feet activity   Assist Walk 150 feet activity did not occur: Safety/medical concerns         Walk 10 feet on uneven surface  activity   Assist Walk 10 feet on uneven surfaces activity did not occur: Safety/medical concerns         Wheelchair     Assist Will  patient use wheelchair at discharge?: Yes Type of Wheelchair: Power    Wheelchair assist level: Supervision/Verbal cueing Max wheelchair distance: 300'    Wheelchair 50 feet with 2 turns activity    Assist        Assist Level: Supervision/Verbal cueing   Wheelchair 150 feet activity     Assist      Assist Level: Supervision/Verbal cueing   Blood pressure 98/64, pulse 83, temperature 98.3 F (36.8 C), temperature source Oral, resp. rate 14, height 5\' 4"  (1.626 m), weight 82.6 kg, last menstrual period 11/22/2020, SpO2 98 %.   Medical Problem List and Plan: 1.  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  -con't PT and OT- goals to transfer better- shower?  - PRAFOs at night and L hand brace 2.  PE/Antithrombotics: -DVT/anticoagulation:  Pharmaceutical: Other (comment)--Eliquis             -antiplatelet therapy: N/A 3. Pain Management: Oxycodone 5mg  q 6 h prn, also  Gabapentin 300mg  BID - increase to TID  Increased gabapentin to 400 mg QID   5/24- will increase Gabapentin to 600 mg QID; will also add Duloxetine 30 mg QHS for pain- if tolerates, will increase to 60 mg and STOP Lexapro at that time  5/30- increase Duloxetine to 60 mg QHS; to help   6/3- pain stable- con't regimen 4. Mood: LCSW to follow for evaluation and support. Team to offer ego support.   Increased Lexapro to 20 mg daily on 5/16  5/24- as above, will stop lexapro once increase duloxetine, if tolerates- will also add Buspar for anxiety 5 mg TID.   5/26- mood about the same- con't regimen  5/27-  pt seen by neuropsych   5/30- will decrease lexapro to 10 mg daily since increased Duloxetine.  6/2- improved mood today after seeing Dr 6/27-               -antipsychotic agents: N/A 5.  Neuropsych: This patient is capable of making decisions on her own behalf. 6. Skin/Wound Care:              Prevalon boots for use when in bed. Continue left hand splint.              --Darco shoe for weight bearing per PT input.   5/18- approved to do silvadene and wound care for feet as well as  PRAFOs for ankles- to wear at night.  5/23- pt likes to wear PRAFOs- explained can wear them any time not in therapy  5/24- asked PT to have OT make L hand splint to help formation of contractures of L hand. Will reinforce to OT when see her.   5/26- has L hand splint/brace- wore last night- con't regimen-will place tegaderm for L wrist scab  6/1- trying to arrange shower this week-con't regimen  6/2- can wear hospital gloves for shower; changed dressings to dry, stop silvadene;   6/3- nails loose/crumbling on L hand- explained it's likely to lose nail.  7. Fluids/Electrolytes/Nutrition: Monitor  I/Os             Added protein  supplements to help promote wound healing.  8.  PAF: Monitor HR tid--continue Metoprolol and Cardizem for rate control.             Vitals:   12/12/20 2004 12/13/20 0536  BP: 102/69 98/64  Pulse: 76 83  Resp: 14 14  Temp: 98.5 F (36.9 C) 98.3 F (36.8 C)  SpO2: 97% 98%     5/30- will decrease diltiazem to 60 mg BID  6/3- BP soft, but no orthostasis- con't regimen 9. Dysphagia: Continue aspiration precautions--No straws.              Advance diet as tolerated  5/23- SLP still seeing- con't regimen  Intermittent supervision 10. ABLA              Hemoglobin 11.3 on 5/16, labs ordered for tomorrow  5/24- Hb 11.0- con't regimen  11. Forming contractures- DIPs B/L and ankles  Continue PRAFOs nightly also will try and get some "arthritis gloves" for hands to help with forming DIP contractures?  5/23-  con't PRAFOs  5/24- will get resting hand splint made for L hand due to worsening contractures.   5/26- wearing L hand splint/brace- mild improvement in finger  curling/contracture formation.   5/27-28- con't regimen, encouraged ROM as possible as well  5/31- pt wearing L hand splint at night and PRAFOs nightly.  12. Loose stools  Improved 13. Vaginal discharge  6/2- will check U/A and Cx- and send off to see if has anything going on- might have yeast, but no itching?  6/3- U/A was negative-could be Eliquis? Fibroids? Will d/w team to see if there's anything to do- no itching/burning, etc.     LOS: 21 days A FACE TO FACE EVALUATION WAS PERFORMED  Tamula Morrical 12/13/2020, 8:50 AM

## 2020-12-13 NOTE — Progress Notes (Signed)
Occupational Therapy Session Note  Patient Details  Name: [name] [name] MRN: [id number] Date of Birth: [birth year]  Today's Date: 12/13/2020 OT Individual Time: 1300-1415 OT Individual Time Calculation (min): 75 min    Short Term Goals: Week 1:  OT Short Term Goal 1 (Week 1): Pt will sit EOB for ~5 minutes while engaged in an ADL task to increase upright tolerance OT Short Term Goal 1 - Progress (Week 1): Met OT Short Term Goal 2 (Week 1): Pt will complete LB dressing at sit<stand level with LRAD OT Short Term Goal 2 - Progress (Week 1): Not met OT Short Term Goal 3 (Week 1): Pt don an overhead shirt with no more than Mod A using adaptive strategies as needed OT Short Term Goal 3 - Progress (Week 1): Met Week 2:  OT Short Term Goal 1 (Week 2): Pt will complete slide board transfer with 1 helper in prep for ADL OOB OT Short Term Goal 1 - Progress (Week 2): Progressing toward goal OT Short Term Goal 2 (Week 2): Pt will complete UB dressing no more than Min A OT Short Term Goal 2 - Progress (Week 2): Progressing toward goal OT Short Term Goal 3 (Week 2): Pt will utilize emotional coping strategies with no more than Min cues consistently to increase participation OT Short Term Goal 3 - Progress (Week 2): Progressing toward goal OT Short Term Goal 4 (Week 2): Pt will perform LB dressing with Max A of 1 OT Short Term Goal 4 - Progress (Week 2): Met Week 3:  OT Short Term Goal 1 (Week 3): Pt will utilize emotional coping strategies with no more than Min cues consistently to increase participation OT Short Term Goal 2 (Week 3): Pt will consistently perform slide board transfers w/ 1 assist in prep for ADL OT Short Term Goal 3 (Week 3): Pt will perform LB dress Mod A of 1 helper OT Short Term Goal 4 (Week 3): Pt will exhibit improved LUE PIP ROM by 5* each to decrease contracture strength  Skilled Therapeutic Interventions/Progress Updates:    Pt received in room and consented to OT tx. Session  focused on transfer training and BUE strengthening and functional activity tolerance training for increased independence for ADLs and functional transfers. Pt req CGA to sit EOB and max A x1 for slide board transfer from EOB to w/c. Pt required increased assist compared to earlier session due to fatigue. Pt continues to require mod-max cuing for anterior lean during slide board transition for safety with fair carryover. Pt wheeled outside, instructed in 1#db unilateral exercises including elbow flexion, chest press, shoulder press, and shoulder flexion for 2x20 with min cuing for proper technique with good carryover. Pt required frequent rest breaks due to fatigue. Therapist then applied PROM to L PIPS to increase ROM and decrease risk for contractures. After tx, pt helped back to bed via slide board with max A and left with all needs met and call light in reach.   Therapy Documentation Precautions:  Precautions Precautions: Fall Precaution Comments: Gangrenous changes in hands Rt>Lt, open foot wounds bilaterally Restrictions Weight Bearing Restrictions: Yes RLE Weight Bearing: Non weight bearing LLE Weight Bearing: Non weight bearing Other Position/Activity Restrictions: WBAT B LEs while wearing compression socks + Darco shoes   Vital Signs: Therapy Vitals Pulse Rate: 74 Resp: 16 BP: (!) 97/59 Patient Position (if appropriate): Lying Oxygen Therapy SpO2: 96 % O2 Device: Room Air Pain: Pain Assessment Pain Scale: 0-10 Pain Score: 6  Pain Type:  Acute pain Pain Location: Hand Pain Orientation: Right;Left Pain Descriptors / Indicators: Aching Pain Intervention(s): Medication (See eMAR)   Therapy/Group: Individual Therapy  Khristopher Kapaun 12/13/2020, 3:45 PM

## 2020-12-14 LAB — URINE CULTURE: Culture: 60000 — AB

## 2020-12-14 MED ORDER — SULFAMETHOXAZOLE-TRIMETHOPRIM 800-160 MG PO TABS
1.0000 | ORAL_TABLET | Freq: Two times a day (BID) | ORAL | Status: AC
  Administered 2020-12-14 – 2020-12-17 (×6): 1 via ORAL
  Filled 2020-12-14 (×6): qty 1

## 2020-12-14 NOTE — Progress Notes (Signed)
Speech Language Pathology Daily Session Note  Patient Details  Name: [name] [name] MRN: [id number] Date of Birth: 1960/11/07  Today's Date: 12/14/2020 SLP Individual Time: 3557-3220 SLP Individual Time Calculation (min): 45 min  Short Term Goals: Week 3: SLP Short Term Goal 1 (Week 3): Pt will demonstrate mildly complex problem solving in functional task (ex: medication/money/time management) with supervison A verbal cues. SLP Short Term Goal 2 (Week 3): Pt will demonstrate alternating attention during complex tasks in 10 minute intervals with Mod I verbal cues. SLP Short Term Goal 3 (Week 3): Pt will participate in higher level word finding task (ex: divergent and convergent naming) with supervision A semantic cues. SLP Short Term Goal 4 (Week 3): Pt will demonstrate recall of novel, daily information with Mod I verbal cues.  Skilled Therapeutic Interventions:   Patient seen for skilled ST session focusing on cognitive-linguistic goals. Patient was receptive to therapy session but did tell SLP that her husband may be calling as one of her kids was graduating high school today. Patient participated in generative naming when given category and initial alphabet letter to use and initially required modA cues but improved to supervision by second trial. In addition, her cognitive processing through this novel task improved and she was much more accurate and efficient. Patient then participated in task of describing definition/function/attributes of noun. She required initially modA cues improving to minA cues to expand/elaborate/provide more detail. At end of session she said "Im done" but was pleasant and appeared to enjoy tasks. Patient continues to benefit from skilled SLP intervention to maximize cognitive-linguistic goals prior to discharge.  Pain Pain Assessment Pain Scale: 0-10 Pain Score: 8  Faces Pain Scale: No hurt Pain Location: Finger (Comment which one) Pain Orientation:  Right;Left Pain Descriptors / Indicators: Aching Pain Frequency: Constant Pain Intervention(s): Medication (See eMAR)  Therapy/Group: Individual Therapy  [name] Nevin, MA, CCC-SLP Speech Therapy

## 2020-12-14 NOTE — Progress Notes (Addendum)
PROGRESS NOTE   Subjective/Complaints: No complaints Patient's chart reviewed- No issues reported overnight BP soft, vital signs otherwise stable    ROS:   Pt denies SOB, abd pain, CP, N/V/C/D, and vision changes       Objective:   No results found. No results for input(s): WBC, HGB, HCT, PLT in the last 72 hours. No results for input(s): NA, K, CL, CO2, GLUCOSE, BUN, CREATININE, CALCIUM in the last 72 hours.  Intake/Output Summary (Last 24 hours) at 12/14/2020 1426 Last data filed at 12/14/2020 1300 Gross per 24 hour  Intake 320 ml  Output 1050 ml  Net -730 ml        Physical Exam: Vital Signs Blood pressure (!) 97/56, pulse 76, temperature 99.5 F (37.5 C), temperature source Oral, resp. rate 16, height 5\' 4"  (1.626 m), weight 84.4 kg, last menstrual period 11/22/2020, SpO2 97 %. Gen: no distress, normal appearing HEENT: oral mucosa pink and moist, NCAT Cardio: Reg rate Chest: normal effort, normal rate of breathing Abd: soft, non-distended Ext: no edema Psychiatric: appropriate- still anxious, but covers it "better" Neurological: alert- no word finding issues seen this AM; still STM issues Skin: . Gangrene noted in finger tips still- R 2nd digit- worst- the "bubble" is better- no signs of infection- L hand fingernails coming off/loose- esp 5th finger- nail coming apart.  Feet- 2nd L digit and R 1st digit actively oozing blood- but look MUCH better- granulating underneath sheding gangrenous changes. Looking much better  Musc: No edema in extremities.  No tenderness in extremities. Motor:  Motor: B/l UE: Shoulder abduction, elbow flexion/extention 4/5, hand limited to due pain, stable.  RLE: HF, KE 4/5, ADF 0/5 (  pain inhibition) LLE: HF, KE 4/5, ADF 2-/5 (  pain inhibition)   Assessment/Plan: 1. Functional deficits which require 3+ hours per day of interdisciplinary therapy in a comprehensive inpatient  rehab setting.  Physiatrist is providing close team supervision and 24 hour management of active medical problems listed below.  Physiatrist and rehab team continue to assess barriers to discharge/monitor patient progress toward functional and medical goals  Care Tool:  Bathing    Body parts bathed by patient: Right arm,Left arm,Chest,Abdomen,Face,Right upper leg,Left upper leg   Body parts bathed by helper: Front perineal area,Buttocks,Right lower leg,Left lower leg     Bathing assist Assist Level: Moderate Assistance - Patient 50 - 74%     Upper Body Dressing/Undressing Upper body dressing   What is the patient wearing?: Pull over shirt    Upper body assist Assist Level: Minimal Assistance - Patient > 75%    Lower Body Dressing/Undressing Lower body dressing      What is the patient wearing?: Pants     Lower body assist Assist for lower body dressing: Total Assistance - Patient < 25%     Toileting Toileting    Toileting assist Assist for toileting: Dependent - Patient 0%     Transfers Chair/bed transfer  Transfers assist     Chair/bed transfer assist level: Maximal Assistance - Patient 25 - 49%     Locomotion Ambulation   Ambulation assist   Ambulation activity did not occur: Safety/medical concerns  Walk 10 feet activity   Assist  Walk 10 feet activity did not occur: Safety/medical concerns        Walk 50 feet activity   Assist Walk 50 feet with 2 turns activity did not occur: Safety/medical concerns         Walk 150 feet activity   Assist Walk 150 feet activity did not occur: Safety/medical concerns         Walk 10 feet on uneven surface  activity   Assist Walk 10 feet on uneven surfaces activity did not occur: Safety/medical concerns         Wheelchair     Assist Will patient use wheelchair at discharge?: Yes Type of Wheelchair: Power    Wheelchair assist level: Supervision/Verbal cueing Max  wheelchair distance: 300'    Wheelchair 50 feet with 2 turns activity    Assist        Assist Level: Supervision/Verbal cueing   Wheelchair 150 feet activity     Assist      Assist Level: Supervision/Verbal cueing   Blood pressure (!) 97/56, pulse 76, temperature 99.5 F (37.5 C), temperature source Oral, resp. rate 16, height 5\' 4"  (1.626 m), weight 84.4 kg, last menstrual period 11/22/2020, SpO2 97 %.   Medical Problem List and Plan: 1.  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  -Continue PT and OT- goals to transfer better- shower?  - PRAFOs at night and L hand brace 2.  PE/Antithrombotics: -DVT/anticoagulation:  Pharmaceutical: Other (comment)--Eliquis             -antiplatelet therapy: N/A 3. Pain Management: Oxycodone 5mg  q 6 h prn, also  Gabapentin 300mg  BID - increase to TID  Increased gabapentin to 400 mg QID   5/24- will increase Gabapentin to 600 mg QID; will also add Duloxetine 30 mg QHS for pain- if tolerates, will increase to 60 mg and STOP Lexapro at that time  5/30- increase Duloxetine to 60 mg QHS; to help   6/3-6/4 pain stable- continue regimen 4. Mood: LCSW to follow for evaluation and support. Team to offer ego support.   Increased Lexapro to 20 mg daily on 5/16  5/24- as above, will stop lexapro once increase duloxetine, if tolerates- will also add Buspar for anxiety 5 mg TID.   5/26- mood about the same- con't regimen  5/27-  pt seen by neuropsych   5/30- will decrease lexapro to 10 mg daily since increased Duloxetine.  6/2- improved mood today after seeing Dr 6/27-               -antipsychotic agents: N/A 5. Neuropsych: This patient is capable of making decisions on her own behalf. 6. Skin/Wound Care:              Prevalon boots  for use when in bed. Continue left hand splint.              --Darco shoe for weight bearing per PT input.   5/18- approved to do silvadene and wound care for feet as well as PRAFOs for ankles- to wear at night.  5/23- pt likes to wear PRAFOs- explained can wear them any time not in therapy  5/24- asked PT to have OT make L hand splint to help formation of  contractures of L hand. Will reinforce to OT when see her.   5/26- has L hand splint/brace- wore last night- con't regimen-will place tegaderm for L wrist scab  6/1- trying to arrange shower this week-con't regimen  6/2- can wear hospital gloves for shower; changed dressings to dry, stop silvadene;   6/3- nails loose/crumbling on L hand- explained it's likely to lose nail.  7. Fluids/Electrolytes/Nutrition: Monitor  I/Os             Continue protein  supplements to help promote wound healing.  8.  PAF: Monitor HR tid--continue Metoprolol and Cardizem for rate control.             Vitals:   12/14/20 0552 12/14/20 1326  BP: 99/60 (!) 97/56  Pulse: 77 76  Resp: 17 16  Temp: 98.3 F (36.8 C) 99.5 F (37.5 C)  SpO2: 95% 97%     5/30- will decrease diltiazem to 60 mg BID  6/4- BP soft, but no orthostasis- continue regimen 9. Dysphagia: Continue aspiration precautions--No straws.              Advance diet as tolerated  5/23- SLP still seeing- con't regimen  Intermittent supervision 10. ABLA              Hemoglobin 11.3 on 5/16, labs ordered for tomorrow  5/24- Hb 11.0- con't regimen  11. Forming contractures- DIPs B/L and ankles  Continue PRAFOs nightly also will try and get some "arthritis gloves" for hands to help with forming DIP contractures?  5/23-  con't PRAFOs  5/24- will get resting hand splint made for L hand due to worsening contractures.   5/26- wearing L hand splint/brace- mild improvement in finger curling/contracture formation.   5/27-28- con't regimen, encouraged ROM as possible as well  5/31- pt wearing L hand splint  at night and PRAFOs nightly.  12. Loose stools  Improved 13. Vaginal discharge  6/4: UC + for ESBL. Bactrim and precautions ordered    LOS: 22 days A FACE TO FACE EVALUATION WAS PERFORMED  Monque Haggar P Ian Cavey 12/14/2020, 2:26 PM

## 2020-12-15 NOTE — Progress Notes (Signed)
Physical Therapy Session Note  Patient Details  Name: Romona Murdy MRN: [id number] Date of Birth: [birth year]  Today's Date: 12/14/2020 PT Individual Time:  1002-1020  PT Individual Time Calculation: 18 min    and Today's Date: 12/14/2020 PT Missed Time: 42 min   Missed Time Reason: Patient fatigue; Other (emotionally labile/ fatigue)   Short Term Goals: Week 2:  PT Short Term Goal 1 (Week 2): pt to demonstrate STS with LRAD max A x1 PT Short Term Goal 1 - Progress (Week 2): Not met PT Short Term Goal 2 (Week 2): pt to demonstrate dynamic sitting balance at supervision PT Short Term Goal 2 - Progress (Week 2): Met PT Short Term Goal 3 (Week 2): pt to initiate static standing at max A PT Short Term Goal 3 - Progress (Week 2): Not met  Skilled Therapeutic Interventions/Progress Updates:  Patient supine in bed on entrance to room on video call with granddaughter. Allowed privacy to complete call. Patient alert and initially c/o fatigue but no pain. She is also upset d/t having no indication that she was to have therapy this day. She relates that she has been having a difficult time being in the hospital and especially today as her granddaughter is graduating high school and she cannot be present. Demos emotional lability discussing recent history and inability to be with family. Relates difficulty with psychologically balancing feelings of need to participate in therapy in order to improve, difficulty dealing with current level of debility, and desire to be at home. Provided pt with current day's schedule as well as schedule for Sunday and Monday. Discussed justification of pt's current feelings  And provided pt with option of participating in therapy session. Pt states preferring to be available for calls from family. Continued education to ensure participation in all upcoming sessions in order to progress to most independent level prior to return home to reduce BOC on family.   Throughout  conversation and education, pt participated in functional reach for items just outside of reach requiring abdominal engagement and trunk rotation/ lateral flexion. No physical assist provided.  Patient supine in bed at end of session with brakes locked, bed alarm set, and all needs within reach.     Therapy Documentation Precautions:  Precautions Precautions: Fall Precaution Comments: Gangrenous changes in hands Rt>Lt, open foot wounds bilaterally Restrictions Weight Bearing Restrictions: Yes RLE Weight Bearing: Non weight bearing LLE Weight Bearing: Non weight bearing Other Position/Activity Restrictions: WBAT B LEs while wearing compression socks + Darco shoes  Therapy/Group: Individual Therapy  Alger Simons PT, DPT 12/14/2020, 7:00 AM

## 2020-12-15 NOTE — Progress Notes (Signed)
PROGRESS NOTE   Subjective/Complaints: Worried about her UTI given her h/o severe sepsis  Husband at bedside Discussed with pharmacy stop date of macrobid of 3 days She has no other complaints   ROS:   Pt denies SOB, abd pain, CP, N/V/C/D, and vision changes, +anxiety       Objective:   No results found. No results for input(s): WBC, HGB, HCT, PLT in the last 72 hours. No results for input(s): NA, K, CL, CO2, GLUCOSE, BUN, CREATININE, CALCIUM in the last 72 hours.  Intake/Output Summary (Last 24 hours) at 12/15/2020 1530 Last data filed at 12/15/2020 1300 Gross per 24 hour  Intake 600 ml  Output 550 ml  Net 50 ml        Physical Exam: Vital Signs Blood pressure (!) 91/55, pulse 68, temperature 98.6 F (37 C), temperature source Oral, resp. rate 17, height 5\' 4"  (1.626 m), weight 81.6 kg, last menstrual period 11/22/2020, SpO2 94 %. Gen: no distress, normal appearing HEENT: oral mucosa pink and moist, NCAT Cardio: Reg rate Chest: normal effort, normal rate of breathing Abd: soft, non-distended Ext: no edema Psychiatric: appropriate- still anxious, but covers it "better" Neurological: alert- no word finding issues seen this AM; still STM issues Skin: . Gangrene noted in finger tips still- R 2nd digit- worst- the "bubble" is better- no signs of infection- L hand fingernails coming off/loose- esp 5th finger- nail coming apart.  Feet- 2nd L digit and R 1st digit actively oozing blood- but look MUCH better- granulating underneath sheding gangrenous changes. Looking much better  Musc: No edema in extremities.  No tenderness in extremities. Motor:  Motor: B/l UE: Shoulder abduction, elbow flexion/extention 4/5, hand limited to due pain, stable.  RLE: HF, KE 4/5, ADF 0/5 (  pain inhibition) LLE: HF, KE 4/5, ADF 2-/5 (  pain inhibition)   Assessment/Plan: 1. Functional deficits which require 3+ hours per day of  interdisciplinary therapy in a comprehensive inpatient rehab setting.  Physiatrist is providing close team supervision and 24 hour management of active medical problems listed below.  Physiatrist and rehab team continue to assess barriers to discharge/monitor patient progress toward functional and medical goals  Care Tool:  Bathing    Body parts bathed by patient: Right arm,Left arm,Chest,Abdomen,Face,Right upper leg,Left upper leg   Body parts bathed by helper: Front perineal area,Buttocks,Right lower leg,Left lower leg     Bathing assist Assist Level: Moderate Assistance - Patient 50 - 74%     Upper Body Dressing/Undressing Upper body dressing   What is the patient wearing?: Pull over shirt    Upper body assist Assist Level: Minimal Assistance - Patient > 75%    Lower Body Dressing/Undressing Lower body dressing      What is the patient wearing?: Pants     Lower body assist Assist for lower body dressing: Total Assistance - Patient < 25%     Toileting Toileting    Toileting assist Assist for toileting: Dependent - Patient 0%     Transfers Chair/bed transfer  Transfers assist     Chair/bed transfer assist level: Maximal Assistance - Patient 25 - 49%     Locomotion Ambulation  Ambulation assist   Ambulation activity did not occur: Safety/medical concerns          Walk 10 feet activity   Assist  Walk 10 feet activity did not occur: Safety/medical concerns        Walk 50 feet activity   Assist Walk 50 feet with 2 turns activity did not occur: Safety/medical concerns         Walk 150 feet activity   Assist Walk 150 feet activity did not occur: Safety/medical concerns         Walk 10 feet on uneven surface  activity   Assist Walk 10 feet on uneven surfaces activity did not occur: Safety/medical concerns         Wheelchair     Assist Will patient use wheelchair at discharge?: Yes Type of Wheelchair: Power     Wheelchair assist level: Supervision/Verbal cueing Max wheelchair distance: 300'    Wheelchair 50 feet with 2 turns activity    Assist        Assist Level: Supervision/Verbal cueing   Wheelchair 150 feet activity     Assist      Assist Level: Supervision/Verbal cueing   Blood pressure (!) 91/55, pulse 68, temperature 98.6 F (37 C), temperature source Oral, resp. rate 17, height 5\' 4"  (1.626 m), weight 81.6 kg, last menstrual period 11/22/2020, SpO2 94 %.   Medical Problem List and Plan: 1.  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  Continue PT and OT- goals to transfer better- shower?  - PRAFOs at night and L hand brace 2.  PE/Antithrombotics: -DVT/anticoagulation:  Pharmaceutical: Other (comment)--Continue Eliquis             -antiplatelet therapy: N/A 3. Pain Management: Oxycodone 5mg  q 6 h prn, also  Gabapentin 300mg  BID - increase to TID  Increased gabapentin to 400 mg QID   5/24- will increase Gabapentin to 600 mg QID; will also add Duloxetine 30 mg QHS for pain- if tolerates, will increase to 60 mg and STOP Lexapro at that time  5/30- increase Duloxetine to 60 mg QHS; to help   6/3-6/5 pain stable- continue regimen 4. Mood: LCSW to follow for evaluation and support. Team to offer ego support.   Increased Lexapro to 20 mg daily on 5/16  5/24- as above, will stop lexapro once increase duloxetine, if tolerates- will also add Buspar for anxiety 5 mg TID.   5/26- mood about the same- con't regimen  5/27-  pt seen by neuropsych   5/30- will decrease lexapro to 10 mg daily since increased Duloxetine.  6/2- improved mood today after seeing Dr 6/27-               -antipsychotic agents: N/A 5. Neuropsych: This patient is capable of making decisions on  her own behalf. 6. Skin/Wound Care:              Prevalon boots for use when in bed. Continue left hand splint.              --Darco shoe for weight bearing per PT input.   5/18- approved to do silvadene and wound care for feet as well as PRAFOs for ankles- to wear at night.  5/23- pt likes to wear PRAFOs- explained can wear  them any time not in therapy  5/24- asked PT to have OT make L hand splint to help formation of contractures of L hand. Will reinforce to OT when see her.   5/26- has L hand splint/brace- wore last night- con't regimen-will place tegaderm for L wrist scab  6/1- trying to arrange shower this week-con't regimen  6/2- can wear hospital gloves for shower; changed dressings to dry, stop silvadene;   6/3- nails loose/crumbling on L hand- explained it's likely to lose nail.  7. Fluids/Electrolytes/Nutrition: Monitor  I/Os             Continue protein  supplements to help promote wound healing.  8.  PAF: Monitor HR tid--continue Metoprolol and Cardizem for rate control.             Vitals:   12/15/20 0556 12/15/20 1330  BP: 95/68 (!) 91/55  Pulse: 76 68  Resp: 17 17  Temp: 98.7 F (37.1 C) 98.6 F (37 C)  SpO2: 96% 94%     5/30- will decrease diltiazem to 60 mg BID  6/4- BP soft, but no orthostasis- continue regimen 9. Dysphagia: Continue aspiration precautions--No straws.              Advance diet as tolerated  5/23- SLP still seeing- con't regimen  Intermittent supervision 10. ABLA              Hemoglobin 11.3 on 5/16, labs ordered for tomorrow  5/24- Hb 11.0- con't regimen  11. Forming contractures- DIPs B/L and ankles  Continue PRAFOs nightly also will try and get some "arthritis gloves" for hands to help with forming DIP contractures?  5/23-  con't PRAFOs  5/24- will get resting hand splint made for L hand due to worsening contractures.   5/26- wearing L hand splint/brace- mild improvement in finger curling/contracture formation.   5/27-28- con't regimen,  encouraged ROM as possible as well  5/31- pt wearing L hand splint at night and PRAFOs nightly.  12. Loose stools  Improved 13. Vaginal discharge  6/4: UC + for ESBL. Bactrim and precautions ordered, stop date 3 days entered. Provided patient reassurance.     LOS: 23 days A FACE TO FACE EVALUATION WAS PERFORMED  [name] [name] P Alicha Raspberry 12/15/2020, 3:30 PM

## 2020-12-16 LAB — CBC
HCT: 36.8 % (ref 36.0–46.0)
Hemoglobin: 11.7 g/dL — ABNORMAL LOW (ref 12.0–15.0)
MCH: 30.3 pg (ref 26.0–34.0)
MCHC: 31.8 g/dL (ref 30.0–36.0)
MCV: 95.3 fL (ref 80.0–100.0)
Platelets: 327 10*3/uL (ref 150–400)
RBC: 3.86 MIL/uL — ABNORMAL LOW (ref 3.87–5.11)
RDW: 16.1 % — ABNORMAL HIGH (ref 11.5–15.5)
WBC: 7.1 10*3/uL (ref 4.0–10.5)
nRBC: 0 % (ref 0.0–0.2)

## 2020-12-16 LAB — BASIC METABOLIC PANEL
Anion gap: 7 (ref 5–15)
BUN: 6 mg/dL (ref 6–20)
CO2: 27 mmol/L (ref 22–32)
Calcium: 9.3 mg/dL (ref 8.9–10.3)
Chloride: 102 mmol/L (ref 98–111)
Creatinine, Ser: 0.59 mg/dL (ref 0.44–1.00)
GFR, Estimated: >60 mL/min (ref >60)
Glucose, Bld: 99 mg/dL (ref 70–99)
Potassium: 3.8 mmol/L (ref 3.5–5.1)
Sodium: 136 mmol/L (ref 135–145)

## 2020-12-16 NOTE — Progress Notes (Signed)
Occupational Therapy Session Note  Patient Details  Name: [name] [name] MRN: [id number] Date of Birth: 1960-11-20  Today's Date: 12/16/2020 OT Individual Time: 8657-8469 OT Individual Time Calculation (min): 26 min    Skilled Therapeutic Interventions/Progress Updates:    Pt greeted in bed with pain manageable without additional interventions. She was agreeable to work on core strengthening and hamstring stretching while sitting in long sitting position in bed while dancing to upbeat music. Min A for semi-reclined to long sitting and pt able to maintain position without physical assistance for remainder of session. Pt able to modify exercises to decrease WB to digits as needed, min cues to increase amplitude of movement and overall participation. At end of session she returned to bed and was boosted up with +2 assist. Pt remained comfortably in bed at close of session, all needs within reach and 4 bedrails up.   Therapy Documentation Precautions:  Precautions Precautions: Fall Precaution Comments: Gangrenous changes in hands Rt>Lt, open foot wounds bilaterally Restrictions Weight Bearing Restrictions: Yes RLE Weight Bearing: Non weight bearing LLE Weight Bearing: Non weight bearing Other Position/Activity Restrictions: WBAT B LEs while wearing compression socks + Darco shoes Vital Signs: Therapy Vitals Temp: 98.6 F (37 C) Temp Source: Oral Pulse Rate: 74 Resp: 18 BP: 101/68 Patient Position (if appropriate): Sitting Oxygen Therapy SpO2: 94 % O2 Device: Room Air ADL: ADL Eating: Not assessed Grooming: Moderate assistance Where Assessed-Grooming: Bed level Upper Body Bathing: Supervision/safety Where Assessed-Upper Body Bathing: Bed level Lower Body Bathing: Other (comment) (+2 assist) Where Assessed-Lower Body Bathing: Bed level Upper Body Dressing: Moderate assistance Where Assessed-Upper Body Dressing: Bed level Lower Body Dressing: Other (Comment) (+2 assist) Where  Assessed-Lower Body Dressing: Bed level Toileting: Other (Comment) (+2 assist) Where Assessed-Toileting: Bed level Toilet Transfer: Not assessed Tub/Shower Transfer: Not assessed ADL Comments: ADL assessment very limited due to heightened pain     Therapy/Group: Individual Therapy  Musette Kisamore A Jamela Cumbo 12/16/2020, 3:51 PM

## 2020-12-16 NOTE — Progress Notes (Signed)
Occupational Therapy Session Note  Patient Details  Name: [name] [name] MRN: [id number] Date of Birth: 05-27-1961  Today's Date: 12/16/2020 OT Individual Time: 1104-1200 OT Individual Time Calculation (min): 56 min   Short Term Goals: Week 3:  OT Short Term Goal 1 (Week 3): Pt will utilize emotional coping strategies with no more than Min cues consistently to increase participation OT Short Term Goal 2 (Week 3): Pt will consistently perform slide board transfers w/ 1 assist in prep for ADL OT Short Term Goal 3 (Week 3): Pt will perform LB dress Mod A of 1 helper OT Short Term Goal 4 (Week 3): Pt will exhibit improved LUE PIP ROM by 5* each to decrease contracture strength     Skilled Therapeutic Interventions/Progress Updates:    Pt greeted in bed with no c/o pain. Opting to defer ADLs at this time. She was agreeable to work on her standing using the [place]. Min A for supine<sit after OT donned her offloading shoes. Once air mattress was deflated, pt completed 4 sit<stands with Max A of 2. Worked on pt increasing anterior weight shift to improve buttocks clearance and hip extension in full standing. Heavily utilized positive reinforcement due to pts anxiety/negative self talk. Afterwards she completed slideboard<w/c with Min A x2, verbal and manual cues for increasing anterior weight shift and vcs for careful placement of her gangrenous digits. Escorted her via w/c to the dayroom where she engaged in wii Just Dance to work on UB/LB strengthening and endurance while seated. Pt required vcs for simultaneous UE/LE engagement. At end of session pt was returned to the room and completed another slideboard transfer back to the bed. She was left with all needs within reach and 4 bedrails up. Notified RN of pts need for dressing changes on her Rt foot due to exposed wound/increased saturation.  Therapy Documentation Precautions:  Precautions Precautions: Fall Precaution Comments: Gangrenous changes in  hands Rt>Lt, open foot wounds bilaterally Restrictions Weight Bearing Restrictions: Yes RLE Weight Bearing: Non weight bearing LLE Weight Bearing: Non weight bearing Other Position/Activity Restrictions: WBAT B LEs while wearing compression socks + Darco shoes ADL: ADL Eating: Not assessed Grooming: Moderate assistance Where Assessed-Grooming: Bed level Upper Body Bathing: Supervision/safety Where Assessed-Upper Body Bathing: Bed level Lower Body Bathing: Other (comment) (+2 assist) Where Assessed-Lower Body Bathing: Bed level Upper Body Dressing: Moderate assistance Where Assessed-Upper Body Dressing: Bed level Lower Body Dressing: Other (Comment) (+2 assist) Where Assessed-Lower Body Dressing: Bed level Toileting: Other (Comment) (+2 assist) Where Assessed-Toileting: Bed level Toilet Transfer: Not assessed Tub/Shower Transfer: Not assessed ADL Comments: ADL assessment very limited due to heightened pain     Therapy/Group: Individual Therapy  Alphonzo Devera A Janean Eischen 12/16/2020, 12:23 PM

## 2020-12-16 NOTE — Progress Notes (Signed)
Speech Language Pathology Weekly Progress and Session Note  Patient Details  Name: [name] [name] MRN: [id number] Date of Birth: [birth year]  Beginning of progress report period: Dec 09, 2020 End of progress report period: December 16, 2020  Today's Date: 12/16/2020 SLP Individual Time: 1000-1055 SLP Individual Time Calculation (min): 55 min  Short Term Goals: Week 3: SLP Short Term Goal 1 (Week 3): Pt will demonstrate mildly complex problem solving in functional task (ex: medication/money/time management) with supervison A verbal cues. SLP Short Term Goal 1 - Progress (Week 3): Met SLP Short Term Goal 2 (Week 3): Pt will demonstrate alternating attention during complex tasks in 10 minute intervals with Mod I verbal cues. SLP Short Term Goal 2 - Progress (Week 3): Met SLP Short Term Goal 3 (Week 3): Pt will participate in higher level word finding task (ex: divergent and convergent naming) with supervision A semantic cues. SLP Short Term Goal 3 - Progress (Week 3): Met SLP Short Term Goal 4 (Week 3): Pt will demonstrate recall of novel, daily information with Mod I verbal cues. SLP Short Term Goal 4 - Progress (Week 3): Met    New Short Term Goals: Week 4: SLP Short Term Goal 1 (Week 4): STG=LTG  due to ELOS  Weekly Progress Updates: Pt has met 4/4 stg's this reporting period due to to improvement in problem solving with mildy complex information, word finding and use of word finding strategies in conversation and recall of daily information. She continues to require occasional verbal cues for higher level word finding and processing speed with complex verbal information. Pt and family education is ongoing. She will benefit from continued skilled SLP tx until DC to home.      Intensity: Minumum of 1-2 x/day, 30 to 90 minutes Frequency: 3 to 5 out of 7 days Duration/Length of Stay: 3-4 weeks Treatment/Interventions: Cognitive remediation/compensation;Cueing hierarchy;Internal/external  aids;Functional tasks;Medication managment;Patient/family education;Speech/Language facilitation   Daily Session  Skilled Therapeutic Interventions: Skilled SLP intervention focused on word finding and cogniton. Pt provided 3-4 synonyms for higher leve lwords with wmoderate assistance. Word descrition task completed with supervision A. No difficulty in word finding noted in conversation this session. Pt was ble to effectively communicate wants/needs and thoughts. She generated word associations when given a specific name or topic with supervision A verbal cues.     General    Pain Pain Assessment Pain Scale: Faces Pain Score: 7  Faces Pain Scale: No hurt Pain Type: Acute pain Pain Location: Toe (Comment which one) Pain Orientation: Right Pain Intervention(s): Medication (See eMAR)  Therapy/Group: Individual Therapy  Darrol Poke Jasslyn Finkel 12/16/2020, 12:21 PM

## 2020-12-16 NOTE — Progress Notes (Signed)
PROGRESS NOTE   Subjective/Complaints:  Pt reports no issues except R 2nd digit now bending- she's concerned= I explained it will declare itself in the next 2 weeks and will see what it will do.  PA spoke with Pharmacy-since is 60k ESBL- 6 doses of Septra is fine.    ROS:    Pt denies SOB, abd pain, CP, N/V/C/D, and vision changes      Objective:   No results found. Recent Labs    12/16/20 0711  WBC 7.1  HGB 11.7*  HCT 36.8  PLT 327   Recent Labs    12/16/20 0711  NA 136  K 3.8  CL 102  CO2 27  GLUCOSE 99  BUN 6  CREATININE 0.59  CALCIUM 9.3    Intake/Output Summary (Last 24 hours) at 12/16/2020 1809 Last data filed at 12/16/2020 1634 Gross per 24 hour  Intake 360 ml  Output 800 ml  Net -440 ml        Physical Exam: Vital Signs Blood pressure 101/68, pulse 74, temperature 98.6 F (37 C), temperature source Oral, resp. rate 18, height 5\' 4"  (1.626 m), weight 81.6 kg, last menstrual period 11/22/2020, SpO2 94 %.   General: awake, alert, appropriate, anxious- tried to do SB transfer- didn't with 1 person- but it appeared anxiety more than strength; NAD HENT: conjugate gaze; oropharynx moist CV: regular rate; no JVD Pulmonary: CTA B/L; no W/R/R- good air movement GI: soft, NT, ND, (+)BS Psychiatric: appropriate; anxious Neurological: alert Skin: . Gangrene in finger tips- R 2nd digit looks more bent at PIP- and more separation demarcation at PIP- L hand fingernails coming off/loose- esp 5th finger- nail coming apart.  Feet- 2nd L digit and R 1st digit actively oozing blood- but look MUCH better- granulating underneath sheding gangrenous changes. Looking much better  Musc: No edema in extremities.  No tenderness in extremities. Motor:  Motor: B/l UE: Shoulder abduction, elbow flexion/extention 4/5, hand limited to due pain, stable.  RLE: HF, KE 4/5, ADF 0/5 (  pain inhibition) LLE: HF, KE 4/5, ADF  2-/5 (  pain inhibition)   Assessment/Plan: 1. Functional deficits which require 3+ hours per day of interdisciplinary therapy in a comprehensive inpatient rehab setting.  Physiatrist is providing close team supervision and 24 hour management of active medical problems listed below.  Physiatrist and rehab team continue to assess barriers to discharge/monitor patient progress toward functional and medical goals  Care Tool:  Bathing    Body parts bathed by patient: Right arm,Left arm,Chest,Abdomen,Face,Right upper leg,Left upper leg   Body parts bathed by helper: Front perineal area,Buttocks,Right lower leg,Left lower leg     Bathing assist Assist Level: Moderate Assistance - Patient 50 - 74%     Upper Body Dressing/Undressing Upper body dressing   What is the patient wearing?: Pull over shirt    Upper body assist Assist Level: Minimal Assistance - Patient > 75%    Lower Body Dressing/Undressing Lower body dressing      What is the patient wearing?: Pants     Lower body assist Assist for lower body dressing: Total Assistance - Patient < 25%     Toileting Toileting  Toileting assist Assist for toileting: Dependent - Patient 0%     Transfers Chair/bed transfer  Transfers assist     Chair/bed transfer assist level: Maximal Assistance - Patient 25 - 49%     Locomotion Ambulation   Ambulation assist   Ambulation activity did not occur: Safety/medical concerns          Walk 10 feet activity   Assist  Walk 10 feet activity did not occur: Safety/medical concerns        Walk 50 feet activity   Assist Walk 50 feet with 2 turns activity did not occur: Safety/medical concerns         Walk 150 feet activity   Assist Walk 150 feet activity did not occur: Safety/medical concerns         Walk 10 feet on uneven surface  activity   Assist Walk 10 feet on uneven surfaces activity did not occur: Safety/medical concerns          Wheelchair     Assist Will patient use wheelchair at discharge?: Yes Type of Wheelchair: Power    Wheelchair assist level: Supervision/Verbal cueing Max wheelchair distance: 300'    Wheelchair 50 feet with 2 turns activity    Assist        Assist Level: Supervision/Verbal cueing   Wheelchair 150 feet activity     Assist      Assist Level: Supervision/Verbal cueing   Blood pressure 101/68, pulse 74, temperature 98.6 F (37 C), temperature source Oral, resp. rate 18, height 5\' 4"  (1.626 m), weight 81.6 kg, last menstrual period 11/22/2020, SpO2 94 %.   Medical Problem List and Plan: 1.  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  Continue PT and OT- goals to transfer better- shower?  - PRAFOs at night and L hand brace  -con't PT and OT- con't braces and attempting a shower this week? 2.  PE/Antithrombotics: -DVT/anticoagulation:  Pharmaceutical: Other (comment)--Continue Eliquis             -antiplatelet therapy: N/A 3. Pain Management: Oxycodone 5mg  q 6 h prn, also  Gabapentin 300mg  BID - increase to TID  Increased gabapentin to 400 mg QID   5/24- will increase Gabapentin to 600 mg QID; will also add Duloxetine 30 mg QHS for pain- if tolerates, will increase to 60 mg and STOP Lexapro at that time  5/30- increase Duloxetine to 60 mg QHS; to help   6/6- pain stable- con't regimen 4. Mood: LCSW to follow for evaluation and support. Team to offer ego support.   Increased Lexapro to 20 mg daily on 5/16  5/24- as above, will stop lexapro once increase duloxetine, if tolerates- will also add Buspar for anxiety 5 mg TID.   5/26- mood about the same- con't regimen  5/27-  pt seen by neuropsych   5/30- will decrease lexapro to 10 mg daily since increased  Duloxetine.  6/2- improved mood today after seeing Dr 6/27-               -antipsychotic agents: N/A 5. Neuropsych: This patient is capable of making decisions on her own behalf. 6. Skin/Wound Care:              Prevalon boots for use when in bed. Continue left hand  splint.              --Darco shoe for weight bearing per PT input.   5/18- approved to do silvadene and wound care for feet as well as PRAFOs for ankles- to wear at night.  5/23- pt likes to wear PRAFOs- explained can wear them any time not in therapy  5/24- asked PT to have OT make L hand splint to help formation of contractures of L hand. Will reinforce to OT when see her.   5/26- has L hand splint/brace- wore last night- con't regimen-will place tegaderm for L wrist scab  6/1- trying to arrange shower this week-con't regimen  6/2- can wear hospital gloves for shower; changed dressings to dry, stop silvadene;   6/3- nails loose/crumbling on L hand- explained it's likely to lose nail.  7. Fluids/Electrolytes/Nutrition: Monitor  I/Os             Continue protein  supplements to help promote wound healing.  8.  PAF: Monitor HR tid--continue Metoprolol and Cardizem for rate control.             Vitals:   12/16/20 0601 12/16/20 1319  BP: 107/66 101/68  Pulse: 73 74  Resp: 14 18  Temp: 98.4 F (36.9 C) 98.6 F (37 C)  SpO2: 97% 94%     5/30- will decrease diltiazem to 60 mg BID  6/6- BP soft, but stable- con't regimen 9. Dysphagia: Continue aspiration precautions--No straws.              Advance diet as tolerated  5/23- SLP still seeing- con't regimen  Intermittent supervision 10. ABLA              Hemoglobin 11.3 on 5/16, labs ordered for tomorrow  5/24- Hb 11.0- con't regimen  11. Forming contractures- DIPs B/L and ankles  Continue PRAFOs nightly also will try and get some "arthritis gloves" for hands to help with forming DIP contractures?  5/23-  con't PRAFOs  5/24- will get resting hand splint made for L hand due  to worsening contractures.   5/26- wearing L hand splint/brace- mild improvement in finger curling/contracture formation.   5/27-28- con't regimen, encouraged ROM as possible as well  5/31- pt wearing L hand splint at night and PRAFOs nightly.  12. Loose stools  Improved 13. Vaginal discharge  6/4: UC + for ESBL. Bactrim and precautions ordered, stop date 3 days entered. Provided patient reassurance.   6/6- finishing Septra in next 24 hours- provided a lot of reassurance.    LOS: 24 days A FACE TO FACE EVALUATION WAS PERFORMED  Anjali Manzella 12/16/2020, 6:09 PM

## 2020-12-16 NOTE — Progress Notes (Signed)
Physical Therapy Session Note  Patient Details  Name: [name] [name] MRN: [id number] Date of Birth: November 09, 1960  Today's Date: 12/16/2020 PT Individual Time: 0800-0900 PT Individual Time Calculation (min): 60 min   Short Term Goals: Week 1:  PT Short Term Goal 1 (Week 1): Pt will perform bed mobility with mod assist PT Short Term Goal 1 - Progress (Week 1): Met PT Short Term Goal 2 (Week 1): Pt will perform SB transfer with max A +2 PT Short Term Goal 2 - Progress (Week 1): Met PT Short Term Goal 3 (Week 1): Pt will tolerate sitting in WC >2 hours between therapies PT Short Term Goal 3 - Progress (Week 1): Met PT Short Term Goal 4 (Week 1): Pt will perform sit>stand with +2 assist PT Short Term Goal 4 - Progress (Week 1): Not met Week 2:  PT Short Term Goal 1 (Week 2): pt to demonstrate STS with LRAD max A x1 PT Short Term Goal 1 - Progress (Week 2): Not met PT Short Term Goal 2 (Week 2): pt to demonstrate dynamic sitting balance at supervision PT Short Term Goal 2 - Progress (Week 2): Met PT Short Term Goal 3 (Week 2): pt to initiate static standing at max A PT Short Term Goal 3 - Progress (Week 2): Not met Week 3:  PT Short Term Goal 1 (Week 3): pt to demonstrate bed<>chair transfers a mod A of one with slide board PT Short Term Goal 2 (Week 3): pt to demonstrate WC mobility 10' in manual WC at min A PT Short Term Goal 3 (Week 3): pt to demonstrate STS max A of onewith LRAD  Skilled Therapeutic Interventions/Progress Updates:    pt received in bed and agreeable to therapy. Pt directed in donning shorts at bed level mod A and min A for rolling to bring over hips. Pt directed in supine>sit at min A and total A for donning wound care shoes. Pt directed in slide board transfer to [facility] with x2 attempts from air mattress and pt unable to achieve good push to slide into WC. Pt benefited from deflating air matress and then improved to min  A for slide board to [facility]. Pt then directed in WC mobility  min A for 100' on level surfaces. Pt agreeable to finishing therapy outside. Pt taken outside  Total A for time and energy. Pt directed in WC mobility on uneven concrete surface min A for turns with improvement with reps, total 150' with one rest break. Pt then returned to unit total A for time. Pt left in WC, All needs in reach and in good condition. Call light in hand.    Therapy Documentation Precautions:  Precautions Precautions: Fall Precaution Comments: Gangrenous changes in hands Rt>Lt, open foot wounds bilaterally Restrictions Weight Bearing Restrictions: Yes RLE Weight Bearing: Non weight bearing LLE Weight Bearing: Non weight bearing Other Position/Activity Restrictions: WBAT B LEs while wearing compression socks + Darco shoes General:   Vital Signs:   Pain: Pain Assessment Pain Scale: Faces Pain Score: 7  Faces Pain Scale: No hurt Pain Type: Acute pain Pain Location: Toe (Comment which one) Pain Orientation: Right Pain Intervention(s): Medication (See eMAR) Mobility:   Locomotion :    Trunk/Postural Assessment :    Balance:   Exercises:   Other Treatments:      Therapy/Group: Individual Therapy  Junie Panning 12/16/2020, 12:20 PM

## 2020-12-16 NOTE — Progress Notes (Signed)
Discussed UCS results with pharmacy--as less than 100,000 and asymptomatic can d/c antibiotics. Discussed patient's anxiety issues regarding medical issues/spesis--> she felt that  3 day course of septra would be appropriate if needed.

## 2020-12-17 NOTE — Progress Notes (Signed)
PROGRESS NOTE   Subjective/Complaints:  Pt reports no issues- R pointer finger still curling more-  Also still requiring 2 people for SB transfers- needs to learn hoyer?   ROS:   Pt denies SOB, abd pain, CP, N/V/C/D, and vision changes      Objective:   No results found. Recent Labs    12/16/20 0711  WBC 7.1  HGB 11.7*  HCT 36.8  PLT 327   Recent Labs    12/16/20 0711  NA 136  K 3.8  CL 102  CO2 27  GLUCOSE 99  BUN 6  CREATININE 0.59  CALCIUM 9.3    Intake/Output Summary (Last 24 hours) at 12/17/2020 1007 Last data filed at 12/17/2020 0700 Gross per 24 hour  Intake 480 ml  Output 1150 ml  Net -670 ml        Physical Exam: Vital Signs Blood pressure 101/65, pulse 82, temperature 98.2 F (36.8 C), temperature source Oral, resp. rate 16, height 5\' 4"  (1.626 m), weight 81.7 kg, last menstrual period 11/22/2020, SpO2 97 %.    General: awake, alert, appropriate, on contact precautions; sitting up in bed; smiling, but anxious; NAD HENT: conjugate gaze; oropharynx moist CV: regular rate; no JVD Pulmonary: CTA B/L; no W/R/R- good air movement GI: soft, NT, ND, (+)BS Psychiatric: appropriate smiling, but anxious about therapy starting.  Neurological: alert- STM issues still noted Skin: . Gangrene in finger tips- R 2nd digit looks more bent at PIP- and more separation demarcation at PIP- even more flexed at the R 2nd PIP today- - L hand fingernails coming off/loose- esp 5th finger- nail coming apart.  Feet- 2nd L digit and R 1st digit actively oozing blood- but look MUCH better- granulating underneath sheding gangrenous changes. Looking much better  Musc: No edema in extremities.  No tenderness in extremities. Motor:  Motor: B/l UE: Shoulder abduction, elbow flexion/extention 4/5, hand limited to due pain, stable.  RLE: HF, KE 4/5, ADF 0/5 (  pain inhibition) LLE: HF, KE 4/5, ADF 2-/5 (  pain inhibition)    Assessment/Plan: 1. Functional deficits which require 3+ hours per day of interdisciplinary therapy in a comprehensive inpatient rehab setting.  Physiatrist is providing close team supervision and 24 hour management of active medical problems listed below.  Physiatrist and rehab team continue to assess barriers to discharge/monitor patient progress toward functional and medical goals  Care Tool:  Bathing    Body parts bathed by patient: Right arm,Left arm,Chest,Abdomen,Face,Right upper leg,Left upper leg   Body parts bathed by helper: Front perineal area,Buttocks,Right lower leg,Left lower leg     Bathing assist Assist Level: Moderate Assistance - Patient 50 - 74%     Upper Body Dressing/Undressing Upper body dressing   What is the patient wearing?: Pull over shirt    Upper body assist Assist Level: Minimal Assistance - Patient > 75%    Lower Body Dressing/Undressing Lower body dressing      What is the patient wearing?: Pants     Lower body assist Assist for lower body dressing: Total Assistance - Patient < 25%     Toileting Toileting    Toileting assist Assist for toileting: Dependent -  Patient 0%     Transfers Chair/bed transfer  Transfers assist     Chair/bed transfer assist level: Minimal Assistance - Patient > 75%     Locomotion Ambulation   Ambulation assist   Ambulation activity did not occur: Safety/medical concerns          Walk 10 feet activity   Assist  Walk 10 feet activity did not occur: Safety/medical concerns        Walk 50 feet activity   Assist Walk 50 feet with 2 turns activity did not occur: Safety/medical concerns         Walk 150 feet activity   Assist Walk 150 feet activity did not occur: Safety/medical concerns         Walk 10 feet on uneven surface  activity   Assist Walk 10 feet on uneven surfaces activity did not occur: Safety/medical concerns         Wheelchair     Assist Will  patient use wheelchair at discharge?: Yes Type of Wheelchair: Manual    Wheelchair assist level: Minimal Assistance - Patient > 75% Max wheelchair distance: 125    Wheelchair 50 feet with 2 turns activity    Assist        Assist Level: Minimal Assistance - Patient > 75%   Wheelchair 150 feet activity     Assist      Assist Level: Supervision/Verbal cueing   Blood pressure 101/65, pulse 82, temperature 98.2 F (36.8 C), temperature source Oral, resp. rate 16, height 5\' 4"  (1.626 m), weight 81.7 kg, last menstrual period 11/22/2020, SpO2 97 %.   Medical Problem List and Plan: 1.  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  Continue PT and OT- goals to transfer better- shower?  - PRAFOs at night and L hand brace  -con't PT and OT- con't braces and attempting a shower this week?  con't PT and OT- transfers still requiring 2 people for SB transfers- 11/24/2020? 2.  PE/Antithrombotics: -DVT/anticoagulation:  Pharmaceutical: Other (comment)--Continue Eliquis             -antiplatelet therapy: N/A 3. Pain Management: Oxycodone 5mg  q 6 h prn, also  Gabapentin 300mg  BID - increase to TID  Increased gabapentin to 400 mg QID   5/24- will increase Gabapentin to 600 mg QID; will also add Duloxetine 30 mg QHS for pain- if tolerates, will increase to 60 mg and STOP Lexapro at that time  5/30- increase Duloxetine to 60 mg QHS; to help   6/7- pain stable- con't regimen 4. Mood: LCSW to follow for evaluation and support. Team to offer ego support.   Increased Lexapro to 20 mg daily on 5/16  5/24- as above, will stop lexapro once increase duloxetine, if tolerates- will also add Buspar for anxiety 5 mg TID.   5/26- mood about the same- con't regimen  5/27-  pt seen by neuropsych    5/30- will decrease lexapro to 10 mg daily since increased Duloxetine.  6/2- improved mood today after seeing Dr 6/27-    6/7- stable mood- con't regimen             -antipsychotic agents: N/A 5. Neuropsych: This patient is capable of making decisions on her own behalf. 6. Skin/Wound Care:  Prevalon boots for use when in bed. Continue left hand splint.              --Darco shoe for weight bearing per PT input.   5/18- approved to do silvadene and wound care for feet as well as PRAFOs for ankles- to wear at night.  5/23- pt likes to wear PRAFOs- explained can wear them any time not in therapy  5/24- asked PT to have OT make L hand splint to help formation of contractures of L hand. Will reinforce to OT when see her.   5/26- has L hand splint/brace- wore last night- con't regimen-will place tegaderm for L wrist scab  6/1- trying to arrange shower this week-con't regimen  6/2- can wear hospital gloves for shower; changed dressings to dry, stop silvadene;   6/3- nails loose/crumbling on L hand- explained it's likely to lose nail.   6/7- more demarcation of 2nd R digit at PIP- think it won't make it? 7. Fluids/Electrolytes/Nutrition: Monitor  I/Os             Continue protein  supplements to help promote wound healing.  8.  PAF: Monitor HR tid--continue Metoprolol and Cardizem for rate control.             Vitals:   12/16/20 1945 12/17/20 0443  BP: 108/68 101/65  Pulse: 80 82  Resp: 16 16  Temp: 98.9 F (37.2 C) 98.2 F (36.8 C)  SpO2: 96% 97%     5/30- will decrease diltiazem to 60 mg BID  6/6- BP soft, but stable- con't regimen 9. Dysphagia: Continue aspiration precautions--No straws.              Advance diet as tolerated  5/23- SLP still seeing- con't regimen  Intermittent supervision 10. ABLA              Hemoglobin 11.3 on 5/16, labs ordered for tomorrow  5/24- Hb 11.0- con't regimen  11. Forming contractures- DIPs B/L and ankles  Continue PRAFOs nightly also  will try and get some "arthritis gloves" for hands to help with forming DIP contractures?  5/23-  con't PRAFOs  5/24- will get resting hand splint made for L hand due to worsening contractures.   5/26- wearing L hand splint/brace- mild improvement in finger curling/contracture formation.   5/27-28- con't regimen, encouraged ROM as possible as well  5/31- pt wearing L hand splint at night and PRAFOs nightly.   6/7- con't braces- stable 12. Loose stools  Improved 13. Vaginal discharge  6/4: UC + for ESBL. Bactrim and precautions ordered, stop date 3 days entered. Provided patient reassurance.   6/6- finishing Septra in next 24 hours- provided a lot of reassurance.    LOS: 25 days A FACE TO FACE EVALUATION WAS PERFORMED  Vance Belcourt 12/17/2020, 10:07 AM

## 2020-12-17 NOTE — Patient Care Conference (Addendum)
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 Genice Rouge Care Coodinator Present: Lavera Guise, BSW;Teonna Coonan Marlyne Beards, RN, BSN, CRRN Nurse Present: Kennyth Arnold, RN PT Present: Peter Congo, PT OT Present: Other (comment) Ocie Cornfield, OT) SLP Present: Colin Benton, SLP PPS Coordinator present : Edson Snowball, PT     Current Status/Progress Goal Weekly Team Focus  Bowel/Bladder   cont of B/B. Last BM-6/5  will remain continent  assess q shift and PRN   Swallow/Nutrition/ Hydration             ADL's   Mod/Min 2+ slide boards, anxiety improved but does occur at times, LB dress Max bed level, sit <> stands in [place] Mod/Max of 1  Min A LB/toileting, Min transfers  sitting balance/core, general conditioning, slide board transfers, ADL retraining, family ed   Mobility   min A bed mob; mod A SB transfers; manual WC min A-CGA 100'  CGA bed mob; min A-mod A transfers; supervision WC  pain management, bed mobility, transfers, manual mobility   Communication   supervision A  mod i  higher level word finding and understanding and use of word finding strategies.   Safety/Cognition/ Behavioral Observations  min-supervision A for alternating  attention and complex ps  mod I  complex problem solving,processing speed and recall   Pain   c/o pain to hand 8 of 10. managed with pain managment  pain<3 of 10  assess pain q shift and PRN   Skin   Necrotic fingers and toes  Free from infection  assess skin q shift and PRN     Discharge Planning:  Discharging gome with spouse and sister to provide care 24/7   Team Discussion: Right 2nd digit at DIP doing poorly, might auto-amputate. BP still soft, doesn't have UTI but still asked to treat. Some fingers may still self-amputate. Continent B/B, 8/10 pain to fingers and toes, Oxycodone q 4 hr. Doing significant wound care to fingers and toes. Has questions about insurance coverage, questions about the ultra light wheelchair and the add-ons. Patient on target to meet rehab goals: yes, therapy is recommending a ramp because there are 2 steps to enter or they will teach the wheelchair bump up. May need transport home. Slide board transfers change from min to mod assist. She has min assist goals. She is supervision for word finding, min assist to supervision for problem solving.  *See Care Plan and progress notes for long and short-term goals.   Revisions to Treatment Plan:  Will need a low air loss mattress at home.  Teaching Needs: Family education, medication management, pain management, skin/wound care, transfer training, lift training, gait training, balance training, endurance training, safety awareness.  Current  Barriers to Discharge: Decreased caregiver support, Medical stability, Home enviroment access/layout, Wound care, Lack of/limited family support, Weight, Weight bearing restrictions, Medication compliance and Behavior  Possible Resolutions to Barriers: Continue current medications, provide emotional support.     Medical Summary Current Status: conintinent B/B; pain 8/10- taking oxy q4 hours prn; gangrene- R 2nd digit at DIP doing poorly-more demarcation  Barriers to Discharge:  Behavior;Decreased family/caregiver support;Home enviroment access/layout;Wound care;Medical stability;Medication compliance;Weight bearing restrictions  Barriers to Discharge Comments: gangrene; might get autoamputation - stil doing significant wound care- ultra light weight manual w/c- needs due to possible amputation- needs ramp- doesn't have one. will need transportation Possible Resolutions to [employer] Focus: [facility] w/c eval; SB min of 1 to mod A of 2; on low air loss mattress- will need one at home; supervision with SLP; min A with cognition; focus on wounds and pain this week- d/c 6/13- [facility] appt Thursday at 1pm   Continued Need for Acute Rehabilitation Level of Care: The patient requires daily medical management by a physician with specialized training in physical medicine and rehabilitation for the following reasons: Direction of a multidisciplinary physical rehabilitation program to maximize functional independence : Yes Medical management of patient stability for increased activity during participation in an intensive rehabilitation regime.: Yes Analysis of laboratory values and/or radiology reports with any subsequent need for medication adjustment and/or medical intervention. : Yes   I attest that I was present, lead the team conference, and concur with the assessment and plan of the team.   Tennis Must 12/17/2020, 6:08 PM

## 2020-12-17 NOTE — Progress Notes (Signed)
Physical Therapy Session Note  Patient Details  Name: [name] [name] MRN: [id number] Date of Birth: [birth year]  Today's Date: 12/17/2020 PT Individual Time: 1100-1200 and 1300-1400 PT Individual Time Calculation (min): 60 min and 60 mins   Short Term Goals: Week 1:  PT Short Term Goal 1 (Week 1): Pt will perform bed mobility with mod assist PT Short Term Goal 1 - Progress (Week 1): Met PT Short Term Goal 2 (Week 1): Pt will perform SB transfer with max A +2 PT Short Term Goal 2 - Progress (Week 1): Met PT Short Term Goal 3 (Week 1): Pt will tolerate sitting in WC >2 hours between therapies PT Short Term Goal 3 - Progress (Week 1): Met PT Short Term Goal 4 (Week 1): Pt will perform sit>stand with +2 assist PT Short Term Goal 4 - Progress (Week 1): Not met Week 2:  PT Short Term Goal 1 (Week 2): pt to demonstrate STS with LRAD max A x1 PT Short Term Goal 1 - Progress (Week 2): Not met PT Short Term Goal 2 (Week 2): pt to demonstrate dynamic sitting balance at supervision PT Short Term Goal 2 - Progress (Week 2): Met PT Short Term Goal 3 (Week 2): pt to initiate static standing at max A PT Short Term Goal 3 - Progress (Week 2): Not met Week 3:  PT Short Term Goal 1 (Week 3): pt to demonstrate bed<>chair transfers a mod A of one with slide board PT Short Term Goal 2 (Week 3): pt to demonstrate WC mobility 10' in manual WC at min A PT Short Term Goal 3 (Week 3): pt to demonstrate STS max A of onewith LRAD Week 4:     Skilled Therapeutic Interventions/Progress Updates:    session 1: pt received in bed and agreeable to therapy. Pt had multiple questions about DC home and equipment. Pt educated on current DC plan, WC eval scheduled for this week, husband coming this week for additional education, and pt educated on all equipment and reasoning for these: air mattress hospital bed, slide board, manual ultra lightweight WC for improved ability to mobilize with this more effectively, manual hoyer  lift. Pt left in bed as found and All needs in reach and in good condition. Call light in hand.    Session 2: pt received in bed and agreeable to therapy. Pt requested to use bed pan prior to getting up, pt had loose stool, total A for hygiene, and max A for clothing management. Pt directed in sit>supine CGA and setup for transfer, pt then reported she needed to use restroom again, sit>supine min A for BLE management for time, total A for clothing management and pt had additional loose stool in bed pan, total A for hygiene and clothing management, then CGA to return to sitting EOB. Pt directed in slide board transfer to WC min A with bed deflated which improves pt's ability to assist. Pt then taken to ortho gym total A for time, directed in car transfer with slide board min A with extra time to enter car with single step cues for Head/hips relationship and feet setup, to exit car min A with pt able to complete with fewer cues and much more effective slides. Pt taken back to room and agreeable to remain in [facility] for next therapy session. Pt left in [facility] All needs in reach and in good condition. Call light in hand.  Pt denied pain.   Therapy Documentation Precautions:  Precautions Precautions: Fall Precaution Comments: Gangrenous  changes in hands Rt>Lt, open foot wounds bilaterally Restrictions Weight Bearing Restrictions: No RLE Weight Bearing: Non weight bearing LLE Weight Bearing: Non weight bearing Other Position/Activity Restrictions: WBAT B LEs while wearing compression socks + Darco shoes General:   Vital Signs: Therapy Vitals Pulse Rate: 76 Resp: 18 BP: (!) 85/69 Patient Position (if appropriate): Sitting Oxygen Therapy SpO2: (!) 87 % O2 Device: Room Air Pain:   Mobility:   Locomotion :    Trunk/Postural Assessment :    Balance:   Exercises:   Other Treatments:      Therapy/Group: Individual Therapy  Junie Panning 12/17/2020, 3:50 PM

## 2020-12-17 NOTE — Progress Notes (Signed)
Physical Therapy Session Note  Patient Details  Name: [name] [name] MRN: [id number] Date of Birth: 20-Jan-1961  Today's Date: 12/17/2020 PT Individual Time: 8657-8469 PT Individual Time Calculation (min): 45 min   Short Term Goals: Week 1:  PT Short Term Goal 1 (Week 1): Pt will perform bed mobility with mod assist PT Short Term Goal 1 - Progress (Week 1): Met PT Short Term Goal 2 (Week 1): Pt will perform SB transfer with max A +2 PT Short Term Goal 2 - Progress (Week 1): Met PT Short Term Goal 3 (Week 1): Pt will tolerate sitting in WC >2 hours between therapies PT Short Term Goal 3 - Progress (Week 1): Met PT Short Term Goal 4 (Week 1): Pt will perform sit>stand with +2 assist PT Short Term Goal 4 - Progress (Week 1): Not met Week 2:  PT Short Term Goal 1 (Week 2): pt to demonstrate STS with LRAD max A x1 PT Short Term Goal 1 - Progress (Week 2): Not met PT Short Term Goal 2 (Week 2): pt to demonstrate dynamic sitting balance at supervision PT Short Term Goal 2 - Progress (Week 2): Met PT Short Term Goal 3 (Week 2): pt to initiate static standing at max A PT Short Term Goal 3 - Progress (Week 2): Not met Week 3:  PT Short Term Goal 1 (Week 3): pt to demonstrate bed<>chair transfers a mod A of one with slide board PT Short Term Goal 2 (Week 3): pt to demonstrate WC mobility 10' in manual WC at min A PT Short Term Goal 3 (Week 3): pt to demonstrate STS max A of onewith LRAD Week 4:     Skilled Therapeutic Interventions/Progress Updates:    Pain:  Pt reports no pain.  Treatment to tolerance.  Rest breaks and repositioning as needed.  Pt initially supine and agreeable to treatment session w/focus on transfers, wc skills.  Supine lifts feet for threading of pants.  Total assist to raise to hips.  Rolls L/R w/rail w/min assist.  Total assist to raise pants.  Sid to sit w/supervision w/rail, additional time. Dons shirt independently in sitting. Darco shoes donned by therapist, total  assist. Bed to wc via sliding board transfer w/overall min assist, cues for foot placement and body mechanice/use of head hips relationship, therapist blocks feet from sliding. wc skills: Pt educated re: availability of power assist add on for manual wc after one year of use if appropriate.  Discussed contacting vendor at that time if this is needed/wanted.  Pt propels 125 x 2/to/from room w/cues for safe use of palms vs fingers, additional time, very slow propulsion, inefficient strokes. Instruction w/turning technique in wc for negotiation of tight turns.  Pt has difficulty coordinating and requires repeated instruction, hand over hand assist. Repeated practice w/turning performed w/above assist. Obstacle course x 30f repeated x 4 w/weaving thru cones placed at 836fintervals.  Heavy cueing required. At end of session, pt left oob in wc, handed over to nursing, nursing notified pt did not have alarm box, chair alarm in room.     Therapy Documentation Precautions:  Precautions Precautions: Fall Precaution Comments: Gangrenous changes in hands Rt>Lt, open foot wounds bilaterally Restrictions Weight Bearing Restrictions: No RLE Weight Bearing: Non weight bearing LLE Weight Bearing: Non weight bearing Other Position/Activity Restrictions: WBAT B LEs while wearing compression socks + Darco shoes   Therapy/Group: Individual Therapy  BaCallie FieldingPTPort Townsend/01/2021, 9:02 AM

## 2020-12-17 NOTE — Progress Notes (Signed)
Patient ID: [name] [name], female   DOB: May 01, 1961, 60 y.o.   MRN: [id number] Team Conference Report to Patient/Family  Team Conference discussion was reviewed with the patient and caregiver, including goals, any changes in plan of care and target discharge date.  Patient and caregiver express understanding and are in agreement.  The patient has a target discharge date of 12/23/20.  SW met with pt and spouse, provided updates. Patient spouse will attend education Thursday. WC eval on Thursday as well.  [name] 12/17/2020, 1:07 PM

## 2020-12-17 NOTE — Progress Notes (Signed)
Occupational Therapy Session Note  Patient Details  Name: [name] [name] MRN: [id number] Date of Birth: 22-Aug-1960  Today's Date: 12/17/2020 OT Individual Time: 5631-4970 OT Individual Time Calculation (min): 57 min    Short Term Goals: Week 2:  OT Short Term Goal 1 (Week 2): Pt will complete slide board transfer with 1 helper in prep for ADL OOB OT Short Term Goal 1 - Progress (Week 2): Progressing toward goal OT Short Term Goal 2 (Week 2): Pt will complete UB dressing no more than Min A OT Short Term Goal 2 - Progress (Week 2): Progressing toward goal OT Short Term Goal 3 (Week 2): Pt will utilize emotional coping strategies with no more than Min cues consistently to increase participation OT Short Term Goal 3 - Progress (Week 2): Progressing toward goal OT Short Term Goal 4 (Week 2): Pt will perform LB dressing with Max A of 1 OT Short Term Goal 4 - Progress (Week 2): Met Week 3:  OT Short Term Goal 1 (Week 3): Pt will utilize emotional coping strategies with no more than Min cues consistently to increase participation OT Short Term Goal 2 (Week 3): Pt will consistently perform slide board transfers w/ 1 assist in prep for ADL OT Short Term Goal 3 (Week 3): Pt will perform LB dress Mod A of 1 helper OT Short Term Goal 4 (Week 3): Pt will exhibit improved LUE PIP ROM by 5* each to decrease contracture strength   Skilled Therapeutic Interventions/Progress Updates:    Pt greeted at time of session up in wheelchair no pain, agreeable to OT session. Pt wanting to go outside for session, transported outside total A for time. Performed 2x20 ball hits with 3# dowel for BUE strength and endurance which is an increase in weight from previous session. Self propel outside with Min A for steering for some uneven surfaces and taking turns, Mod A for problem solving. Able to self propel both outside on uneven concrete but improved inside on smooth even tile, Supervision for short distance. Once back in  room, slide board wheelchair > bed with Mod/Max of 1. Note pt can perform as well as Min A but better toward L side to wheelchair, noted to have more pain in R hand with slide board > R side. Sit > supine Mod A for LEs, call bell in reach all needs met.   Therapy Documentation Precautions:  Precautions Precautions: Fall Precaution Comments: Gangrenous changes in hands Rt>Lt, open foot wounds bilaterally Restrictions Weight Bearing Restrictions: No RLE Weight Bearing: Non weight bearing LLE Weight Bearing: Non weight bearing Other Position/Activity Restrictions: WBAT B LEs while wearing compression socks + Darco shoes     Therapy/Group: Individual Therapy  [name] 12/17/2020, 7:23 AM

## 2020-12-18 NOTE — Progress Notes (Signed)
Recreational Therapy Session Note  Patient Details  Name: [name] [name] MRN: [id number] Date of Birth: 1960/07/14 Today's Date: 12/18/2020  Pain: no c/o Skilled Therapeutic Interventions/Progress Updates: Session focused on activity tolerance, slide board transfers, dynamic sitting balance, UE strengthening during cotreat with PT.  Pt in bed upon arrival, agreeable to OOB activity but requesting assistance with donning shorts bed level.  Pt performed slide board transfer with +2 assist, 2nd person to steady board during transfer, slide board transfer with min-mod assist.  Pt taken to the dayroom for balance activity seated w/c level using 2 lb weighted bar to hit beach volleyball back and forth with LRT.  Therapy/Group: Co-Treatment   [name] [name] 12/18/2020, 3:17 PM

## 2020-12-18 NOTE — Progress Notes (Signed)
Physical Therapy Session Note  Patient Details  Name: [name] [name] MRN: [id number] Date of Birth: 01/22/1961  Today's Date: 12/18/2020 PT Individual Time: 1400-1500 PT Individual Time Calculation (min): 60 min   Short Term Goals: Week 3:  PT Short Term Goal 1 (Week 3): pt to demonstrate bed<>chair transfers a mod A of one with slide board PT Short Term Goal 2 (Week 3): pt to demonstrate WC mobility 10' in manual WC at min A PT Short Term Goal 3 (Week 3): pt to demonstrate STS max A of onewith LRAD  Skilled Therapeutic Interventions/Progress Updates:   Pt received supine in bed and agreeable to PT. Supine>sit transfer with mod assist and min cues for motor planning and use of bed rail as needed, mild back pain 3/10 limiting independence.   SB transfers with min assist overall and +2 present for safety and throughout session . Min-mod cues for head hips relationship and improved use of BUE to facilitate pelvic translation   Seated UE therex with recreation therapist present. Ball taps with 3# bar weight forward x 3 min to the R and L x 2 min each and in rotation from R and L x 2 with no trunkal support.   WC mobility through hall x 184f with supervision assist and cues for turning technique. Pt required to replace band-aide on 2nd and 5th digit to protect [facility]   Pt returned to room and performed SB transfer to bed with min assist as listed abover. Sit>supine completed with mod assist for BLE management and left supine in bed with call bell in reach and all needs met.        Therapy Documentation Precautions:  Precautions Precautions: Fall Precaution Comments: Gangrenous changes in hands Rt>Lt, open foot wounds bilaterally Restrictions Weight Bearing Restrictions: No RLE Weight Bearing: Non weight bearing LLE Weight Bearing: Non weight bearing Other Position/Activity Restrictions: WBAT B LEs while wearing compression socks + Darco shoes General:   Vital  Signs:  Pain: Pain Assessment Pain Scale: 0-10 Faces Pain Scale: No hurt Mobility:   Locomotion :    Trunk/Postural Assessment :    Balance:   Exercises:   Other Treatments:      Therapy/Group: Individual Therapy  ALorie Phenix6/02/2021, 6:30 PM

## 2020-12-18 NOTE — Progress Notes (Signed)
Occupational Therapy Session Note  Patient Details  Name: [name] [name] MRN: [id number] Date of Birth: [birth year]  Today's Date: 12/18/2020 OT Individual Time: 1000-1111 OT Individual Time Calculation (min): 71 min    Short Term Goals: Week 1:  OT Short Term Goal 1 (Week 1): Pt will sit EOB for ~5 minutes while engaged in an ADL task to increase upright tolerance OT Short Term Goal 1 - Progress (Week 1): Met OT Short Term Goal 2 (Week 1): Pt will complete LB dressing at sit<stand level with LRAD OT Short Term Goal 2 - Progress (Week 1): Not met OT Short Term Goal 3 (Week 1): Pt don an overhead shirt with no more than Mod A using adaptive strategies as needed OT Short Term Goal 3 - Progress (Week 1): Met Week 2:  OT Short Term Goal 1 (Week 2): Pt will complete slide board transfer with 1 helper in prep for ADL OOB OT Short Term Goal 1 - Progress (Week 2): Progressing toward goal OT Short Term Goal 2 (Week 2): Pt will complete UB dressing no more than Min A OT Short Term Goal 2 - Progress (Week 2): Progressing toward goal OT Short Term Goal 3 (Week 2): Pt will utilize emotional coping strategies with no more than Min cues consistently to increase participation OT Short Term Goal 3 - Progress (Week 2): Progressing toward goal OT Short Term Goal 4 (Week 2): Pt will perform LB dressing with Max A of 1 OT Short Term Goal 4 - Progress (Week 2): Met Week 3:  OT Short Term Goal 1 (Week 3): Pt will utilize emotional coping strategies with no more than Min cues consistently to increase participation OT Short Term Goal 2 (Week 3): Pt will consistently perform slide board transfers w/ 1 assist in prep for ADL OT Short Term Goal 3 (Week 3): Pt will perform LB dress Mod A of 1 helper OT Short Term Goal 4 (Week 3): Pt will exhibit improved LUE PIP ROM by 5* each to decrease contracture strength  Skilled Therapeutic Interventions/Progress Updates:    Pt received in w/c and consented to OT tx. Pt  reports she has been emotional all day, whenever she talks she just starts crying, and really just wants to go home. Pt taken outside for some fresh air and discussion about home discharge plan. Pt reports she is not worried about anything in the home, she just isn't sure about the car transfers in and out. While outside, pt instructed in BUE strengthening exercises with 1# db including elbow flexion, shoulder press, chest press, and shoulder flexion for 3x15 with min cuing for proper technique with good carryover. Pt then taken back inside to practice toilet transfers to platform DS BSC positioned over toilet. Utilized SB for transfer with therapist assisting with holding SB in place during transfer, guiding hips, and cueing pt for proper anterior weight shift and proper hand placement. Once on commode, pt able to weight shift to shimmy pants off with mod A, and min A for posterior hygiene thoroughness. Pt weight shifts very well on commode for hygiene and clothing mgmt. Pt req max A to hike pants all the way up, transferred back w/c via SB with mod A and cuing for proper hand placement and weight shift. Pt reported she was exhausted, requested to go back to bed. Pt squat pivot transferred back to bed with max A with tactile cuing for rocking/weight shifting, assisted pt to get her legs all the way into bed. Pharmacist, community  mattress on, pt left semifowler in bed with call light in reach and all needs met.   Therapy Documentation Precautions:  Precautions Precautions: Fall Precaution Comments: Gangrenous changes in hands Rt>Lt, open foot wounds bilaterally Restrictions Weight Bearing Restrictions: No RLE Weight Bearing: Non weight bearing LLE Weight Bearing: Non weight bearing Other Position/Activity Restrictions: WBAT B LEs while wearing compression socks + Darco shoes   Vital Signs: Therapy Vitals Temp: 98.7 F (37.1 C) Pulse Rate: 96 Resp: 16 BP: 102/65 Patient Position (if appropriate): Lying Oxygen  Therapy SpO2: 100 % O2 Device: Room Air Pain: Pain Assessment Pain Scale: 0-10 Pain Score: 0-No pain   Therapy/Group: Individual Therapy  Ansley Stanwood 12/18/2020, 12:08 PM

## 2020-12-18 NOTE — Progress Notes (Signed)
Speech Language Pathology Daily Session Note  Patient Details  Name: [name] [name] MRN: [id number] Date of Birth: [birth year]  Today's Date: 12/18/2020 SLP Individual Time: 1135-1200 SLP Individual Time Calculation (min): 25 min  Short Term Goals: Week 4: SLP Short Term Goal 1 (Week 4): STG=LTG  due to ELOS  Skilled Therapeutic Interventions:   Patient seen for skilled ST session to address cognitive-linguistic goals. She reported that she had an "emotional day" today and yesterday but continues to be motivated to work in therapy to achieve goals prior to upcoming discharge 6/12. She was able to demonstrate divergent naming with supervisionA. She demonstrated recall of specific therapeutic interventions, therapist's names, etc with supervisionA . Patient continues to benefit from skilled SLP intervention to maximize cognitive-linguistic goals prior to discharge.    Pain Pain Assessment Pain Scale: 0-10 Faces Pain Scale: No hurt  Therapy/Group: Individual Therapy  [name] Nevin, MA, CCC-SLP Speech Therapy

## 2020-12-18 NOTE — Progress Notes (Signed)
PROGRESS NOTE   Subjective/Complaints: Pt reports had a bad night last night- didn't wear L hand brace because of that, but usually wears.   Started to be tearful about how she's doing during visit.  Also admits nails are "1/2 off" on L hand- she just wants to them to fall off and "be done".    ROS:   Pt denies SOB, abd pain, CP, N/V/C/D, and vision changes     Objective:   No results found. Recent Labs    12/16/20 0711  WBC 7.1  HGB 11.7*  HCT 36.8  PLT 327   Recent Labs    12/16/20 0711  NA 136  K 3.8  CL 102  CO2 27  GLUCOSE 99  BUN 6  CREATININE 0.59  CALCIUM 9.3    Intake/Output Summary (Last 24 hours) at 12/18/2020 0948 Last data filed at 12/17/2020 2100 Gross per 24 hour  Intake 240 ml  Output 850 ml  Net -610 ml        Physical Exam: Vital Signs Blood pressure 102/65, pulse 96, temperature 98.7 F (37.1 C), resp. rate 16, height 5\' 4"  (1.626 m), weight 81.7 kg, last menstrual period 11/22/2020, SpO2 100 %.     General: awake, alert, appropriate, sitting up in bed; NAD HENT: conjugate gaze; oropharynx moist CV: regular rate; no JVD Pulmonary: CTA B/L; no W/R/R- good air movement GI: soft, NT, ND, (+)BS Psychiatric: appropriate but tearful throughout Neurological: STM issues Skin: . Gangrene in finger tips- R 2nd digit looks more bent at PIP and now at least 30-45 degrees- and more separation demarcation at PIP- even more daily- - even more flexed at the R 2nd PIP today- - L hand nails very loose and wiggling  Feet- B/L tops of toes look much better and great toes no bleeding seen granulating underneath shedding gangrenous changes. Looking much better  Musc: No edema in extremities.  No tenderness in extremities. Motor:  Motor: B/l UE: Shoulder abduction, elbow flexion/extention 4/5, hand limited to due pain, stable.  RLE: HF, KE 4/5, ADF 0/5 (  pain inhibition) LLE: HF, KE 4/5, ADF 2-/5  (  pain inhibition)   Assessment/Plan: 1. Functional deficits which require 3+ hours per day of interdisciplinary therapy in a comprehensive inpatient rehab setting.  Physiatrist is providing close team supervision and 24 hour management of active medical problems listed below.  Physiatrist and rehab team continue to assess barriers to discharge/monitor patient progress toward functional and medical goals  Care Tool:  Bathing    Body parts bathed by patient: Right arm,Left arm,Chest,Abdomen,Face,Right upper leg,Left upper leg   Body parts bathed by helper: Front perineal area,Buttocks,Right lower leg,Left lower leg     Bathing assist Assist Level: Moderate Assistance - Patient 50 - 74%     Upper Body Dressing/Undressing Upper body dressing   What is the patient wearing?: Pull over shirt    Upper body assist Assist Level: Minimal Assistance - Patient > 75%    Lower Body Dressing/Undressing Lower body dressing      What is the patient wearing?: Pants     Lower body assist Assist for lower body dressing: Total Assistance - Patient <  25%     Toileting Toileting    Toileting assist Assist for toileting: Dependent - Patient 0%     Transfers Chair/bed transfer  Transfers assist     Chair/bed transfer assist level: Minimal Assistance - Patient > 75%     Locomotion Ambulation   Ambulation assist   Ambulation activity did not occur: Safety/medical concerns          Walk 10 feet activity   Assist  Walk 10 feet activity did not occur: Safety/medical concerns        Walk 50 feet activity   Assist Walk 50 feet with 2 turns activity did not occur: Safety/medical concerns         Walk 150 feet activity   Assist Walk 150 feet activity did not occur: Safety/medical concerns         Walk 10 feet on uneven surface  activity   Assist Walk 10 feet on uneven surfaces activity did not occur: Safety/medical concerns          Wheelchair     Assist Will patient use wheelchair at discharge?: Yes Type of Wheelchair: Manual    Wheelchair assist level: Minimal Assistance - Patient > 75% Max wheelchair distance: 125    Wheelchair 50 feet with 2 turns activity    Assist        Assist Level: Minimal Assistance - Patient > 75%   Wheelchair 150 feet activity     Assist      Assist Level: Supervision/Verbal cueing   Blood pressure 102/65, pulse 96, temperature 98.7 F (37.1 C), resp. rate 16, height 5\' 4"  (1.626 m), weight 81.7 kg, last menstrual period 11/22/2020, SpO2 100 %.   Medical Problem List and Plan: 1.  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  con't PT and OT- still working on SB transfers, but also to go home with hoyer for bad times.  2.  PE/Antithrombotics: -DVT/anticoagulation:  Pharmaceutical: Other (comment)--Continue Eliquis             -antiplatelet therapy: N/A 3. Pain Management: Oxycodone 5mg  q 6 h prn, also  Gabapentin 300mg  BID - increase to TID  Increased gabapentin to 400 mg QID   5/24- will increase Gabapentin to 600 mg QID; will also add Duloxetine 30 mg QHS for pain- if tolerates, will increase to 60 mg and STOP Lexapro at that time  5/30- increase Duloxetine to 60 mg QHS; to help   6/8- pain stable- con't regimen- taking oxy throughout day 4. Mood: LCSW to follow for evaluation and support. Team to offer ego support.   Increased Lexapro to 20 mg daily on 5/16  5/24- as above, will stop lexapro once increase duloxetine, if tolerates- will also add Buspar for anxiety 5 mg TID.   5/26- mood about the same- con't regimen  5/27-  pt seen by neuropsych   5/30- will decrease lexapro to 10 mg daily since increased Duloxetine.  6/2- improved mood today  after seeing Dr 6/27-    6/8- bad day today- tearful- if this continues, might need to switch back from Duloxetine to more lexapro?             -antipsychotic agents: N/A 5. Neuropsych: This patient is capable of making decisions on her own behalf.  6. Skin/Wound Care:              Prevalon boots for use when in bed. Continue left hand splint.              --Darco shoe for weight bearing per PT input.   5/18- approved to do silvadene and wound care for feet as well as PRAFOs for ankles- to wear at night.  6/2- can wear hospital gloves for shower; changed dressings to dry, stop silvadene;   6/3- nails loose/crumbling on L hand- explained it's likely to lose nail.   6/7- more demarcation of 2nd R digit at PIP- think it won't make it?  6/8- R 2nd finger is main issue- con't bracing and con't to monitor 7. Fluids/Electrolytes/Nutrition: Monitor  I/Os             Continue protein  supplements to help promote wound healing.  8.  PAF: Monitor HR tid--continue Metoprolol and Cardizem for rate control.             Vitals:   12/18/20 0649 12/18/20 0858  BP: (!) 104/50 102/65  Pulse: 88 96  Resp: 18 16  Temp: 99.3 F (37.4 C) 98.7 F (37.1 C)  SpO2: 96% 100%     5/30- will decrease diltiazem to 60 mg BID  6/6- BP soft, but stable- con't regimen 9. Dysphagia: Continue aspiration precautions--No straws.              Advance diet as tolerated  5/23- SLP still seeing- con't regimen  Intermittent supervision 10. ABLA              Hemoglobin 11.3 on 5/16, labs ordered for tomorrow  5/24- Hb 11.0- con't regimen  11. Forming contractures- DIPs B/L and ankles  Continue PRAFOs nightly also will try and get some "arthritis gloves" for hands to help with forming DIP contractures?  5/23-  con't PRAFOs  5/24- will get resting hand splint made for L hand due to worsening contractures.   5/26- wearing L hand splint/brace- mild improvement in finger curling/contracture formation.   5/27-28- con't regimen,  encouraged ROM as possible as well  5/31- pt wearing L hand splint at night and PRAFOs nightly.  12. Loose stools  Improved 13. Vaginal discharge  6/4: UC + for ESBL. Bactrim and precautions ordered, stop date 3 days entered. Provided patient reassurance.   6/6- finishing Septra in next 24 hours- provided a lot of reassurance.    LOS: 26 days A FACE TO FACE EVALUATION WAS PERFORMED  Mohamad Bruso 12/18/2020, 9:48 AM

## 2020-12-18 NOTE — Progress Notes (Signed)
Physical Therapy Session Note  Patient Details  Name: [name] [name] MRN: [id number] Date of Birth: May 21, 1961  Today's Date: 12/18/2020 PT Individual Time: 5462-7035 PT Individual Time Calculation (min): 45 min   Short Term Goals: Week 3:  PT Short Term Goal 1 (Week 3): pt to demonstrate bed<>chair transfers a mod A of one with slide board PT Short Term Goal 2 (Week 3): pt to demonstrate WC mobility 10' in manual WC at min A PT Short Term Goal 3 (Week 3): pt to demonstrate STS max A of onewith LRAD  Skilled Therapeutic Interventions/Progress Updates:    Pt received seated in bed, agreeable to PT session. No complaints of pain. Assisted pt with donning pants at bed level. Supine to sit with min A for trunk elevation. Slide board transfer bed to w/c with mod A, max cueing for head/hips relationship during transfer. Once seated in w/c pt able to perform oral hygiene while seated in w/c at sink with setup A. Manual w/c propulsion around room and for short distance in hallway with focus on navigating turns and tight spaces, Supervision level with use of BUE. Pt left seated in w/c in room with needs in reach at end of session.  Therapy Documentation Precautions:  Precautions Precautions: Fall Precaution Comments: Gangrenous changes in hands Rt>Lt, open foot wounds bilaterally Restrictions Weight Bearing Restrictions: No RLE Weight Bearing: Non weight bearing LLE Weight Bearing: Non weight bearing Other Position/Activity Restrictions: WBAT B LEs while wearing compression socks + Darco shoes    Therapy/Group: Individual Therapy   Peter Congo, PT, DPT, CSRS  12/18/2020, 10:15 AM

## 2020-12-19 MED ORDER — LOPERAMIDE HCL 2 MG PO CAPS
2.0000 mg | ORAL_CAPSULE | ORAL | Status: DC | PRN
  Administered 2020-12-19 – 2020-12-20 (×2): 2 mg via ORAL
  Filled 2020-12-19 (×2): qty 1

## 2020-12-19 MED ORDER — BUSPIRONE HCL 5 MG PO TABS
5.0000 mg | ORAL_TABLET | Freq: Three times a day (TID) | ORAL | Status: DC | PRN
  Administered 2020-12-19: 5 mg via ORAL

## 2020-12-19 NOTE — Progress Notes (Signed)
Patient ID: [name] [name], female   DOB: 04/25/1961, 60 y.o.   MRN: [id number]  Hospital bed, [facility], Transfer Board, and drop arm commode ordered through [employer].   [place], [place] [phone]

## 2020-12-19 NOTE — Progress Notes (Signed)
Occupational Therapy Session Note  Patient Details  Name: [name] [name] MRN: [id number] Date of Birth: 01-30-1961  Today's Date: 12/19/2020 OT Individual Time: 5732-2025 OT Individual Time Calculation (min): 76 min    Short Term Goals: Week 2:  OT Short Term Goal 1 (Week 2): Pt will complete slide board transfer with 1 helper in prep for ADL OOB OT Short Term Goal 1 - Progress (Week 2): Progressing toward goal OT Short Term Goal 2 (Week 2): Pt will complete UB dressing no more than Min A OT Short Term Goal 2 - Progress (Week 2): Progressing toward goal OT Short Term Goal 3 (Week 2): Pt will utilize emotional coping strategies with no more than Min cues consistently to increase participation OT Short Term Goal 3 - Progress (Week 2): Progressing toward goal OT Short Term Goal 4 (Week 2): Pt will perform LB dressing with Max A of 1 OT Short Term Goal 4 - Progress (Week 2): Met Week 3:  OT Short Term Goal 1 (Week 3): Pt will utilize emotional coping strategies with no more than Min cues consistently to increase participation OT Short Term Goal 2 (Week 3): Pt will consistently perform slide board transfers w/ 1 assist in prep for ADL OT Short Term Goal 3 (Week 3): Pt will perform LB dress Mod A of 1 helper OT Short Term Goal 4 (Week 3): Pt will exhibit improved LUE PIP ROM by 5* each to decrease contracture strength    Skilled Therapeutic Interventions/Progress Updates:    Pt greeted at time of session supine in bed resting agreeable to OT session, no pain. Donned pants bed level Mod A with assist to thread but pt donning over hips with extended time and multiple rolls L/R. Supine > sit CGA with extended time for trunk elevation. Donned DARCO shoes and pt wanting to try a squat pivot today instead of slide board. Squat pivot bed > wheelchair with Mod A and pt stating she could have done more to assist. Discussed DME for home as pt is now progressing to squat pivots. Set up at sink and performed  grooming/face washing/oral hygiene at sink. Pt donning gloves and coban to keep water out of gloves/off digits. MD entering at this time, pt having questions about med list and DC planning. Pt wanting to shave legs for ADL, therapists assisting past knee level and pt shaving above knee. Lotion applied as well. Note that coban removal did take off scab with minimal bleeding noted, RN aware and placed bandaid. Pt set up in wheelchair with call bell in reach all needs met.   Therapy Documentation Precautions:  Precautions Precautions: Fall Precaution Comments: Gangrenous changes in hands Rt>Lt, open foot wounds bilaterally Restrictions Weight Bearing Restrictions: No RLE Weight Bearing: Non weight bearing LLE Weight Bearing: Non weight bearing Other Position/Activity Restrictions: WBAT B LEs while wearing compression socks + Darco shoes    Therapy/Group: Individual Therapy  [name] 12/19/2020, 7:14 AM

## 2020-12-19 NOTE — Progress Notes (Signed)
Occupational Therapy Weekly Progress Note  Patient Details  Name: [name] [name] MRN: [id number] Date of Birth: 01-04-1961  Beginning of progress report period: Dec 10, 2020 End of progress report period: December 19, 2020    Patient has met 2 of 4 short term goals.  Pt has shown progress toward OT goals for this reporting period and is now completing slide board transfers consistently with A of 1 helper, and started doing squat pivot transfers with Mod/Max A. Pt is Mod/Max for LB dressing bed level. Pt PIP contractures are also improving. Plan to continue family ed with husband [name] [name] in prep for DC home.   Patient continues to demonstrate the following deficits: muscle weakness, decreased cardiorespiratoy endurance, impaired timing and sequencing, decreased coordination, and decreased motor planning, and decreased sitting balance, decreased standing balance, decreased postural control, and decreased balance strategies and therefore will continue to benefit from skilled OT intervention to enhance overall performance with BADL, iADL, and Reduce care partner burden.  Patient progressing toward long term goals..  Continue plan of care.  OT Short Term Goals Week 3:  OT Short Term Goal 1 (Week 3): Pt will utilize emotional coping strategies with no more than Min cues consistently to increase participation OT Short Term Goal 1 - Progress (Week 3): Progressing toward goal OT Short Term Goal 2 (Week 3): Pt will consistently perform slide board transfers w/ 1 assist in prep for ADL OT Short Term Goal 2 - Progress (Week 3): Met OT Short Term Goal 3 (Week 3): Pt will perform LB dress Mod A of 1 helper OT Short Term Goal 3 - Progress (Week 3): Met OT Short Term Goal 4 (Week 3): Pt will exhibit improved LUE PIP ROM by 5* each to decrease contracture strength OT Short Term Goal 4 - Progress (Week 3): Progressing toward goal Week 4:  OT Short Term Goal 1 (Week 4): STGs = LTGs d/t ELOS    Therapy  Documentation Precautions:  Precautions Precautions: Fall Precaution Comments: Gangrenous changes in hands Rt>Lt, open foot wounds bilaterally Restrictions Weight Bearing Restrictions: No RLE Weight Bearing: Non weight bearing LLE Weight Bearing: Non weight bearing Other Position/Activity Restrictions: WBAT B LEs while wearing compression socks + Darco shoes    Therapy/Group: Individual Therapy  [name] 12/19/2020, 4:39 PM

## 2020-12-19 NOTE — Progress Notes (Signed)
Subjective: Present with husband.  States she is doing well overall.  One of the necrotic portions of digit on left hand fell off.  Mild pain in right index finger.   Objective: Vital signs in last 24 hours: Temp:  [98.5 F (36.9 C)-98.9 F (37.2 C)] 98.5 F (36.9 C) (06/09 1408) Pulse Rate:  [75-85] 75 (06/09 1408) Resp:  [15-18] 15 (06/09 1408) BP: (106-118)/(56-69) 118/69 (06/09 1408) SpO2:  [86 %-99 %] 86 % (06/09 1408)  Intake/Output from previous day: 06/08 0701 - 06/09 0700 In: 240 [P.O.:240] Out: 1100 [Urine:1100] Intake/Output this shift: Total I/O In: 240 [P.O.:240] Out: -   No results for input(s): HGB in the last 72 hours. No results for input(s): WBC, RBC, HCT, PLT in the last 72 hours. No results for input(s): NA, K, CL, CO2, BUN, CREATININE, GLUCOSE, CALCIUM in the last 72 hours. No results for input(s): LABPT, INR in the last 72 hours.  Dry necrosis of distal aspect index, long, ring, small right hand.  Small necrotic portions on left hand fingertips.  No erythema or purulence.  Scan bloody drainage on bandaid of right index finger.  Dip joint flexed.    Assessment/Plan: Dry necrosis multiple digits.  Discussed that necrosis on left fingers may heal underneath and necrotic portion fall off as it has done on one digit.  Right digits with greater necrotic area will likely need amputation.  She is scheduled for discharge early next week.  They are from [place].  Okay to follow up with hand surgeon local to them after discharge.  If they need assistance or would like for me to manage issue, we will be happy to she her as an outpatient in office.  If necrotic area falls off, she can place a dressing on it and call for instruction on steps to follow.  They were given the name of a colleague in [place] who would be a good resource for management of her hand issues.  They are comfortable with following up back home.   Betha Loa 12/19/2020, 4:15 PM

## 2020-12-19 NOTE — Progress Notes (Signed)
Physical Therapy Session Note  Patient Details  Name: [name] [name] MRN: [id number] Date of Birth: 1961-07-07  Today's Date: 12/19/2020 PT Individual Time: 1300-1400 PT Individual Time Calculation (min): 60 min   Short Term Goals: Week 1:  PT Short Term Goal 1 (Week 1): Pt will perform bed mobility with mod assist PT Short Term Goal 1 - Progress (Week 1): Met PT Short Term Goal 2 (Week 1): Pt will perform SB transfer with max A +2 PT Short Term Goal 2 - Progress (Week 1): Met PT Short Term Goal 3 (Week 1): Pt will tolerate sitting in WC >2 hours between therapies PT Short Term Goal 3 - Progress (Week 1): Met PT Short Term Goal 4 (Week 1): Pt will perform sit>stand with +2 assist PT Short Term Goal 4 - Progress (Week 1): Not met Week 2:  PT Short Term Goal 1 (Week 2): pt to demonstrate STS with LRAD max A x1 PT Short Term Goal 1 - Progress (Week 2): Not met PT Short Term Goal 2 (Week 2): pt to demonstrate dynamic sitting balance at supervision PT Short Term Goal 2 - Progress (Week 2): Met PT Short Term Goal 3 (Week 2): pt to initiate static standing at max A PT Short Term Goal 3 - Progress (Week 2): Not met Week 3:  PT Short Term Goal 1 (Week 3): pt to demonstrate bed<>chair transfers a mod A of one with slide board PT Short Term Goal 2 (Week 3): pt to demonstrate WC mobility 10' in manual WC at min A PT Short Term Goal 3 (Week 3): pt to demonstrate STS max A of onewith LRAD  Skilled Therapeutic Interventions/Progress Updates:    pt received in bed and agreeable to therapy. Husband present throughout session. Pt educated and completed custom WC eval for first half of session with therapist present to provide assistance and recommednations with vendor for increased pt I and safety for mobility at home upon DC. Therapy currently rocommending ultra lightweight WC for improved mobility and I with propulsion with safety for skin integity of BUE, ROHO cushion for pressure relief. Pt agreeable  to all recommendations. Vender present for measurement and recommendations as well. Once vendor completed evaluation, pt and husband educated on safety with transfers bed<>chair with slide board and stand pivot. Pt unable to achieve complete transfer with Stand pivot transfer with husband or PT and able to tell husband she didn't feel safe completing this and then directed in slide board transfer to [facility] at min A to complete. Pt able to direct husband in technique with this. Pt then directed in slide baord back to bed with husband to assist min A. PT insturcted in Head/hips relationship and anterior weight shifting to complete with good recall. Pt directed in sit>supine with min A and left in bed, All needs in reach and in good condition. Call light in hand.  And husband present.   Therapy Documentation Precautions:  Precautions Precautions: Fall Precaution Comments: Gangrenous changes in hands Rt>Lt, open foot wounds bilaterally Restrictions Weight Bearing Restrictions: No RLE Weight Bearing: Non weight bearing LLE Weight Bearing: Non weight bearing Other Position/Activity Restrictions: WBAT B LEs while wearing compression socks + Darco shoes General:   Vital Signs: Therapy Vitals Temp: 98.5 F (36.9 C) Temp Source: Oral Pulse Rate: 75 Resp: 15 BP: 118/69 Patient Position (if appropriate): Lying Oxygen Therapy SpO2: (!) 86 % O2 Device: Room Air Pain: Pain Assessment Pain Scale: 0-10 Pain Score: 0-No pain Mobility:   Locomotion :  Trunk/Postural Assessment :    Balance:   Exercises:   Other Treatments:      Therapy/Group: Individual Therapy  Junie Panning 12/19/2020, 4:26 PM

## 2020-12-19 NOTE — Progress Notes (Signed)
PROGRESS NOTE   Subjective/Complaints:  Did a squat pivot transfer- didn't use board- asking to reduce overall medications.   Doing training. Family training today.     ROS:   Pt denies SOB, abd pain, CP, N/V/C/D, and vision changes    Objective:   No results found. No results for input(s): WBC, HGB, HCT, PLT in the last 72 hours.  No results for input(s): NA, K, CL, CO2, GLUCOSE, BUN, CREATININE, CALCIUM in the last 72 hours.   Intake/Output Summary (Last 24 hours) at 12/19/2020 1007 Last data filed at 12/19/2020 0724 Gross per 24 hour  Intake 240 ml  Output 1100 ml  Net -860 ml        Physical Exam: Vital Signs Blood pressure (!) 106/56, pulse 85, temperature 98.9 F (37.2 C), temperature source Oral, resp. rate 18, height 5\' 4"  (1.626 m), weight 81.7 kg, last menstrual period 11/22/2020, SpO2 99 %.      General: awake, alert, appropriate, sitting up in w/c at sink; NAD HENT: conjugate gaze; oropharynx moist CV: regular rate; no JVD Pulmonary: CTA B/L; no W/R/R- good air movement GI: soft, NT, ND, (+)BS Psychiatric: appropriate; brighter, less anxious Neurological: alert; STM issues Skin: . Gangrene in finger tips- R 2nd digit looks more bent at PIP and now at least 30-45 degrees- and more separation demarcation at PIP- even more daily- - even more flexed at the R 2nd PIP today- - L hand nails very loose and wiggling no change Feet- B/L tops of toes look much better and great toes no bleeding seen granulating underneath shedding gangrenous changes. Looking much better  Musc: No edema in extremities.  No tenderness in extremities. Motor:  Motor: B/l UE: Shoulder abduction, elbow flexion/extention 4/5, hand limited to due pain, stable.  RLE: HF, KE 4/5, ADF 0/5 (  pain inhibition) LLE: HF, KE 4/5, ADF 2-/5 (  pain inhibition)   Assessment/Plan: 1. Functional deficits which require 3+ hours per day of  interdisciplinary therapy in a comprehensive inpatient rehab setting. Physiatrist is providing close team supervision and 24 hour management of active medical problems listed below. Physiatrist and rehab team continue to assess barriers to discharge/monitor patient progress toward functional and medical goals  Care Tool:  Bathing    Body parts bathed by patient: Right arm, Left arm, Chest, Abdomen, Face, Right upper leg, Left upper leg   Body parts bathed by helper: Front perineal area, Buttocks, Right lower leg, Left lower leg     Bathing assist Assist Level: Moderate Assistance - Patient 50 - 74%     Upper Body Dressing/Undressing Upper body dressing   What is the patient wearing?: Pull over shirt    Upper body assist Assist Level: Minimal Assistance - Patient > 75%    Lower Body Dressing/Undressing Lower body dressing      What is the patient wearing?: Pants     Lower body assist Assist for lower body dressing: Total Assistance - Patient < 25%     Toileting Toileting    Toileting assist Assist for toileting: Maximal Assistance - Patient 25 - 49%     Transfers Chair/bed transfer  Transfers assist  Chair/bed transfer assist level: Moderate Assistance - Patient 50 - 74%     Locomotion Ambulation   Ambulation assist   Ambulation activity did not occur: Safety/medical concerns          Walk 10 feet activity   Assist  Walk 10 feet activity did not occur: Safety/medical concerns        Walk 50 feet activity   Assist Walk 50 feet with 2 turns activity did not occur: Safety/medical concerns         Walk 150 feet activity   Assist Walk 150 feet activity did not occur: Safety/medical concerns         Walk 10 feet on uneven surface  activity   Assist Walk 10 feet on uneven surfaces activity did not occur: Safety/medical concerns         Wheelchair     Assist Will patient use wheelchair at discharge?: Yes Type of  Wheelchair: Manual    Wheelchair assist level: Minimal Assistance - Patient > 75% Max wheelchair distance: 125    Wheelchair 50 feet with 2 turns activity    Assist        Assist Level: Minimal Assistance - Patient > 75%   Wheelchair 150 feet activity     Assist      Assist Level: Supervision/Verbal cueing   Blood pressure (!) 106/56, pulse 85, temperature 98.9 F (37.2 C), temperature source Oral, resp. rate 18, height 5\' 4"  (1.626 m), weight 81.7 kg, last menstrual period 11/22/2020, SpO2 99 %.   Medical Problem List and Plan: 1.  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  con't PT and OT- still working on SB transfers, but also to go home with hoyer for bad times.   Con't PT and OT- did squat pivot transfer this AM.  2.  PE/Antithrombotics: -DVT/anticoagulation:  Pharmaceutical: Other (comment)--Continue Eliquis             -antiplatelet therapy: N/A 3. Pain Management: Oxycodone 5mg  q 6 h prn, also  Gabapentin 300mg  BID - increase to TID  Increased gabapentin to 400 mg QID   5/24- will increase Gabapentin to 600 mg QID; will also add Duloxetine 30 mg QHS for pain- if tolerates, will increase to 60 mg and STOP Lexapro at that time  5/30- increase Duloxetine to 60 mg QHS; to help   6/8- pain stable- con't regimen- taking oxy throughout day  6/9- went over all meds- and decided not to wean any other meds except buspar.  4. Mood: LCSW to follow for evaluation and support. Team to offer ego support.   Increased Lexapro to 20 mg daily on 5/16  5/24- as above, will stop lexapro once increase duloxetine, if tolerates- will also add Buspar for anxiety 5 mg TID.   5/26- mood about the same- con't regimen  5/27-  pt seen by neuropsych   5/30- will decrease  lexapro to 10 mg daily since increased Duloxetine.  6/2- improved mood today after seeing Dr 6/27-    6/8- bad day today- tearful- if this continues, might need to switch back from Duloxetine to more lexapro?  6/9- Will change buspar to prn- per pt request.              -antipsychotic agents: N/A 5.  Neuropsych: This patient is capable of making decisions on her own behalf. 6. Skin/Wound Care:              Prevalon boots for use when in bed. Continue left hand splint.              --Darco shoe for weight bearing per PT input.   5/18- approved to do silvadene and wound care for feet as well as PRAFOs for ankles- to wear at night.  6/2- can wear hospital gloves for shower; changed dressings to dry, stop silvadene;   6/3- nails loose/crumbling on L hand- explained it's likely to lose nail.   6/7- more demarcation of 2nd R digit at PIP- think it won't make it?  6/8- R 2nd finger is main issue- con't bracing and con't to monitor 7. Fluids/Electrolytes/Nutrition: Monitor  I/Os             Continue protein  supplements to help promote wound healing.  8.  PAF: Monitor HR tid--continue Metoprolol and Cardizem for rate control.             Vitals:   12/18/20 1300 12/18/20 1958  BP: (!) 119/59 (!) 106/56  Pulse: 76 85  Resp: 17 18  Temp: 98.6 F (37 C) 98.9 F (37.2 C)  SpO2: 97% 99%     5/30- will decrease diltiazem to 60 mg BID  6/6- BP soft, but stable- con't regimen  6/9- BP still soft, but no issue with standing this AM-  9. Dysphagia: Continue aspiration precautions--No straws.              Advance diet as tolerated  5/23- SLP still seeing- con't regimen  Intermittent supervision 10. ABLA              Hemoglobin 11.3 on 5/16, labs ordered for tomorrow  5/24- Hb 11.0- con't regimen  11. Forming contractures- DIPs B/L and ankles  Continue PRAFOs nightly also will try and get some "arthritis gloves" for hands to help with forming DIP contractures?  5/23-  con't PRAFOs  5/24- will get  resting hand splint made for L hand due to worsening contractures.   5/26- wearing L hand splint/brace- mild improvement in finger curling/contracture formation.   5/27-28- con't regimen, encouraged ROM as possible as well  5/31- pt wearing L hand splint at night and PRAFOs nightly.  12. Loose stools  Improved 13. Vaginal discharge  6/4: UC + for ESBL. Bactrim and precautions ordered, stop date 3 days entered. Provided patient reassurance.   6/6- finishing Septra in next 24 hours- provided a lot of reassurance.  14. Dispo  6/9- doing family training today- d/c 6/13.     LOS: 27 days A FACE TO FACE EVALUATION WAS PERFORMED  [name] [name] 12/19/2020, 10:07 AM

## 2020-12-19 NOTE — Progress Notes (Signed)
Occupational Therapy Session Note  Patient Details  Name: [name] [name] MRN: [id number] Date of Birth: Dec 25, 1960  Today's Date: 12/19/2020 OT Individual Time: 6967-8938 OT Individual Time Calculation (min): 50 min    Short Term Goals: Week 3:  OT Short Term Goal 1 (Week 3): Pt will utilize emotional coping strategies with no more than Min cues consistently to increase participation OT Short Term Goal 1 - Progress (Week 3): Progressing toward goal OT Short Term Goal 2 (Week 3): Pt will consistently perform slide board transfers w/ 1 assist in prep for ADL OT Short Term Goal 2 - Progress (Week 3): Met OT Short Term Goal 3 (Week 3): Pt will perform LB dress Mod A of 1 helper OT Short Term Goal 3 - Progress (Week 3): Met OT Short Term Goal 4 (Week 3): Pt will exhibit improved LUE PIP ROM by 5* each to decrease contracture strength OT Short Term Goal 4 - Progress (Week 3): Progressing toward goal  Skilled Therapeutic Interventions/Progress Updates:    Pt and spouse received in room and consented to OT tx. Communication with PT prior to session, PT requested OT to show spouse manual hoyer lift again and car transfers. After talking with pt and spouse, spouse reported he'd been shown the hoyer lift already and that he felt comfortable with hoyer lift and would feel comfortable showing pt's sister how to use it, if needed, but did not think he needed to see it again. Pt and spouse felt the session should focus on car transfers. Pt helped to sit EOB with CGA, therapist donned darco shoes and assisted with SB and w/c placement, and pt able to slide from EOB to w/c via SB with CGA and cuing for hand placement and body mechanics. Therapist used foot to block pt's feet from sliding during all transfers. Rolled pt down to car simulator for time mgmt, pt and spouse educated in proper wc and slide board setup for car transfer and pt was able to complete slide board transfer to car with min A, with assist to  get BLEs into car, cuing for anterior lean and hand placement and increased time.  Once finally in car, pt became emotional and upset that she wasn't doing better than she was. Encouraged pt that she has come a very long way and that she is completing these transfers at CGA-min A level. Instructed spouse on safest way to get w/c over curb at home with him verbalizing understanding. Pt then able to slide board transfer back into w/c from car with min A and cues and increased time. Pt requested to end therapy session 2/2 extreme fatigue, helped pt SB transfer back to bed with CGA, reposition with min A, and left with all needs met. Answered questions about home environment and encouraged pt to keep practicing functional transfers with therapy to increase independence for home. Overall, pt missing 10 minutes of OT session. Left pt in bed with all needs met and spouse at bedside.   Therapy Documentation Precautions:  Precautions Precautions: Fall Precaution Comments: Gangrenous changes in hands Rt>Lt, open foot wounds bilaterally Restrictions Weight Bearing Restrictions: No RLE Weight Bearing: Non weight bearing LLE Weight Bearing: Non weight bearing Other Position/Activity Restrictions: WBAT B LEs while wearing compression socks + Darco shoes General: General OT Amount of Missed Time: 10 Minutes Vital Signs: Therapy Vitals Temp: 98.5 F (36.9 C) Temp Source: Oral Pulse Rate: 75 Resp: 15 BP: 118/69 Patient Position (if appropriate): Lying Oxygen Therapy SpO2: (!) 86 %  O2 Device: Room Air Pain: Pain Assessment Pain Scale: 0-10 Pain Score: 0-No pain    Therapy/Group: Individual Therapy  [name] [name] 12/19/2020, 5:36 PM

## 2020-12-19 NOTE — Progress Notes (Signed)
Speech Language Pathology Daily Session Note  Patient Details  Name: [name] [name] MRN: [id number] Date of Birth: 21-Feb-1961  Today's Date: 12/19/2020 SLP Individual Time: 1445-1520 SLP Individual Time Calculation (min): 35 min  Short Term Goals: Week 2: SLP Short Term Goal 1 (Week 2): Pt will demonstrate complex problem solving in functional task (ex: medication/money/time management) with supervison A verbal cues. SLP Short Term Goal 1 - Progress (Week 2): Revised due to lack of progress SLP Short Term Goal 2 (Week 2): Pt will demonstrate alternating attention during complex tasks in 10 minute intervals with Mod I verbal cues. SLP Short Term Goal 2 - Progress (Week 2): Progressing toward goal SLP Short Term Goal 3 (Week 2): Pt will demonstrate recall of novel, daily information with Mod I verbal cues. SLP Short Term Goal 3 - Progress (Week 2): Progressing toward goal SLP Short Term Goal 4 (Week 2): Pt will participate in higher level word finding task (ex: divergent and convergent naming) with supervision A semantic cues. SLP Short Term Goal 4 - Progress (Week 2): Progressing toward goal  Skilled Therapeutic Interventions:   Patient seen with husband present for ST session focusing on cognitive goals and family education. 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 When SLP describing results and patient's difficulties in attention and problem solving she did seem to become frustrated with this. SLP provided education to both patient and husband about potential difficulties that might arise when she discharges on 6/13. Patient will have HH PT, OT, SLP. Patient continues to benefit from skilled SLP intervention to maximize cognitive-linguistic function prior to discharge.  Pain Pain Assessment Pain Scale: 0-10 Pain Score: 0-No pain  Therapy/Group: Individual Therapy  [name] Nevin, MA, CCC-SLP Speech Therapy

## 2020-12-19 NOTE — Progress Notes (Signed)
Recreational Therapy Session Note  Patient Details  Name: [name] [name] MRN: [id number] Date of Birth: 1961-07-07 Today's Date: 12/19/2020  Pain: no c/o Skilled Therapeutic Interventions/Progress Updates:  Met with pt and husband at bedside to discuss current progress, leisure participation at discharge including potential modifications.  Discussed community pursuits in the future including safety concerns, awareness of self (overheating, over stimulation, energy use) and energy conservation techniques.  Both stated understanding.  Pt is looking forward to upcoming discharge.  Therapy/Group: Individual Therapy  Hayla Hinger 12/19/2020, 4:05 PM

## 2020-12-20 NOTE — Progress Notes (Signed)
Occupational Therapy Session Note  Patient Details  Name: [name] [name] MRN: [id number] Date of Birth: 23-Apr-1961  Today's Date: 12/20/2020 OT Individual Time: 1005-1100 OT Individual Time Calculation (min): 55 min    Skilled Therapeutic Interventions/Progress Updates:    Pt greeted in the w/c with pain manageable for tx. She asked if she could have her hair washed before her w/c evaluation. We used the hair washing tray to wash/condition her hair to address psychosocial health and wellness. Advised pt to purchase a hair washing tray for home use to use before f/u OT helps her and spouse problem solve shower transfer/showering post d/c. Pt was able to brush/comb hair given Min A today! OT used coband on the handle of hairbrush to assist with grip. Afterwards pt completed oral care, implemented adaptive strategies on her own given setup assistance. At this time, representative from [employer] arrived to complete w/c eval. OT assisted with providing pertinent information to rep and w/c education to pt. Pt remained sitting in the w/c at close of session, all needs within reach.   Therapy Documentation Precautions:   Pain: Pain Assessment Pain Scale: 0-10 Pain Score: 0-No pain ADL:     Therapy/Group: Individual Therapy  Kandyce Dieguez A Chaseton Yepiz 12/20/2020, 3:16 PM

## 2020-12-20 NOTE — Progress Notes (Signed)
Patient ID: [name] [name], female   DOB: [birth year], 60 y.o.   MRN: [id number]  Provider list provided to patient of PCP of patient's d/c location  [place], [place] [phone]

## 2020-12-20 NOTE — Progress Notes (Signed)
Patient ID: [name] [name], female   DOB: July 20, 1960, 60 y.o.   MRN: [id number]  Patient [facility] referral sent to [facility]  [place], [place] [phone]

## 2020-12-20 NOTE — Progress Notes (Signed)
Physical Therapy Weekly Progress Note  Patient Details  Name: [name] [name] MRN: [id number] Date of Birth: 20-Jun-1961  Beginning of progress report period: December 12, 2020 End of progress report period: December 20, 2020  Today's Date: 12/20/2020 PT Individual Time: 0800-0900 and 1300-1400  PT Individual Time Calculation (min): 60 min and 60 mins  Patient has met 3 of 3 short term goals.  Pt has shown great progress with functional mobility during this reporting period. Pt currently demonstrating supervision-CGA supine>sit and CGA sit>supine with infrequently needing minA pending fatigue. Pt also is tolerating sitting OOB for longer periods of time, decreased reported pain and has greatly improved with transfers. Pt and her husband have completed two family education sessions and husband rep[orts he feels comfortable assisting pt with all needs and with use of all equipment.   Patient continues to demonstrate the following deficits muscle weakness, decreased cardiorespiratoy endurance, and decreased sitting balance, decreased standing balance, decreased postural control, and decreased balance strategies and therefore will continue to benefit from skilled PT intervention to increase functional independence with mobility.  Patient progressing toward long term goals..  Continue plan of care.  PT Short Term Goals Week 1:  PT Short Term Goal 1 (Week 1): Pt will perform bed mobility with mod assist PT Short Term Goal 1 - Progress (Week 1): Met PT Short Term Goal 2 (Week 1): Pt will perform SB transfer with max A +2 PT Short Term Goal 2 - Progress (Week 1): Met PT Short Term Goal 3 (Week 1): Pt will tolerate sitting in WC >2 hours between therapies PT Short Term Goal 3 - Progress (Week 1): Met PT Short Term Goal 4 (Week 1): Pt will perform sit>stand with +2 assist PT Short Term Goal 4 - Progress (Week 1): Not met Week 2:  PT Short Term Goal 1 (Week 2): pt to demonstrate STS with LRAD max A x1 PT Short  Term Goal 1 - Progress (Week 2): Not met PT Short Term Goal 2 (Week 2): pt to demonstrate dynamic sitting balance at supervision PT Short Term Goal 2 - Progress (Week 2): Met PT Short Term Goal 3 (Week 2): pt to initiate static standing at max A PT Short Term Goal 3 - Progress (Week 2): Not met Week 3:  PT Short Term Goal 1 (Week 3): pt to demonstrate bed<>chair transfers a mod A of one with slide board PT Short Term Goal 1 - Progress (Week 3): Met PT Short Term Goal 2 (Week 3): pt to demonstrate WC mobility 10' in manual WC at min A PT Short Term Goal 2 - Progress (Week 3): Met PT Short Term Goal 3 (Week 3): pt to demonstrate STS max A of onewith LRAD PT Short Term Goal 3 - Progress (Week 3): Met Week 4:  PT Short Term Goal 1 (Week 4): pt able to verbalize setup and technique for slide board transfers for safety and good technique with home transfers PT Short Term Goal 2 (Week 4): pt to recall pressure relief techniques and schedule PT Short Term Goal 3 (Week 4): pt to demonstrate WC mobility 50' with no more than min A  Skilled Therapeutic Interventions/Progress Updates:    Session 1: pt received in bed and agreeable to therapy. Pt given handouts for pressure relief and PT educated on relief techniques in sitting and in bed with pictures provided on handouts. PT also provided HEP hand out with picture as listed below: Exercises: Seated Ankle Pumps  Supine Heel Slide  Supine Hip Abduction  Seated March Seated Long Arc Quad Seated Biceps Curl  Seated Chest Press with Dumbbells Seated Overhead Press Supine Lower Trunk Rotation  Pt reported understanding and demonstrated good recall of activity. Pt then directed in supine> supervision from mildly elevated HOB and use of rail. Pt reported she dressed herself this AM and sat up to eat breakfast at EOB without physical assistance. PT donned wound care boots at EOB total A for time, nursing then present for morning medications, pt demonstrated  supervision dynamic sitting and static sitting balance during this time without LOB on air mattress. Pt directed in slide board to [facility], min A with pt able to recall good Head/hips relationship and setup for board placement. PT provided VC and visual demonstration for improved weight shifting to increase pt's ability to slide on board more effectively. Pt then supervision for repositioning in WC, directed in [place] mobility for 100' CGA with decreased cadence overall but good propulsion ability. Pt educated on importance of palm technique on wheel propulsion handles to prevent skin breakdown at fingers. Pt demonstrated good ability to complete this. Pt also given orange and yellow theraband for exercises at home and demonstrated how to place these on hands/palms for skin integrity. Pt agreeable. Pt then taken back to room in [facility], total A for time. Pt agreeable to being left in [facility], All needs in reach and in good condition. Call light in hand.    Session 2: pt received in bed and agreeable to therapy. Pt reported she needed to urinate, setup with bedpan with pt rolling for placement at CGA to roll and total A for bed pan placement, mod A for clothing management, (+) bladder void and total A for pericare. Pt directed in supine>sit CGA with bed rail use, and sat EOB CGA-supervision. [employer] WC representative present to drop off loaner WC for pt to use until custom is complete. PT and vendor went over [facility] parts and techniques to use for pt to improve overall mobility. Pt then directed in slide board transfer to [facility] at min A with extra time to complete and VC for Head/hips relationship. Pt directed in [facility] mobility for 54' CGA for mobility in new chair and safety with use at home. Pt required rest of distance to ortho gym total A for time. Pt directed in car transfer with slide board to and from min A with VC for Head/hips relationship and then returned to room total A. Pt requested to return to bed, min A with slide board and  min A for BLE into bed 2/2 fatigue. Pt was tearful at end of session reporting to have been feeling overwhelmed but was improving. Pt had calmed down at end of session and left in bed, All needs in reach and in good condition. Call light in hand. Nursing present.    Therapy Documentation Precautions:  Precautions Precautions: Fall Precaution Comments: Gangrenous changes in hands Rt>Lt, open foot wounds bilaterally Restrictions Weight Bearing Restrictions: No RLE Weight Bearing: Non weight bearing LLE Weight Bearing: Non weight bearing Other Position/Activity Restrictions: WBAT B LEs while wearing compression socks + Darco shoes General:   Vital Signs:  Pain:   Vision/Perception     Mobility:   Locomotion :    Trunk/Postural Assessment :    Balance:   Exercises:   Other Treatments:     Therapy/Group: Individual Therapy  [name] [name] 12/20/2020, 9:23 AM

## 2020-12-20 NOTE — Progress Notes (Signed)
Recreational Therapy Discharge Summary Patient Details  Name: Chyenne Sobczak MRN: [id number] Date of Birth: 09/17/1960 Today's Date: 12/20/2020  Long term goals set: 1  Long term goals met: 1  Comments on progress toward goals:  Pt has made great progress during LOS and is ready for discharge home with family to provide/coordinate 24 hour supervision/assistance.  TR sessions focused on activity tolerance, activity analysis identifying potential modifications, community pursuits, energy conservation and coping strategies.  Pt met supervision level for seated TR tasks, directs her care, and is thrilled about upcoming discharge. Reasons for discharge: discharge from hospital   Patient/family agrees with progress made and goals achieved: Yes  Liron Eissler 12/20/2020, 12:32 PM

## 2020-12-20 NOTE — Progress Notes (Signed)
PROGRESS NOTE   Subjective/Complaints:  Pt reports she saw Ortho hand surgeon- he is going to have her f/u with hand surgeon in [place], [place]- since they live there.   Was able to get R leg back in bed when it fell out this AM- first time she didn't need help.  No more diarrhea.  Was very loose- no abd pain.     ROS:   Pt denies SOB, abd pain, CP, N/V/C/D, and vision changes    Objective:   No results found. No results for input(s): WBC, HGB, HCT, PLT in the last 72 hours.  No results for input(s): NA, K, CL, CO2, GLUCOSE, BUN, CREATININE, CALCIUM in the last 72 hours.   Intake/Output Summary (Last 24 hours) at 12/20/2020 0852 Last data filed at 12/20/2020 0808 Gross per 24 hour  Intake 660 ml  Output 400 ml  Net 260 ml        Physical Exam: Vital Signs Blood pressure (!) 100/58, pulse 77, temperature 98.5 F (36.9 C), temperature source Oral, resp. rate 18, height 5\' 4"  (1.626 m), weight 83 kg, last menstrual period 11/22/2020, SpO2 98 %.       General: awake, alert, appropriate, sitting up slightly in bed; smiling,  NAD HENT: conjugate gaze; oropharynx moist CV: regular rate; no JVD Pulmonary: CTA B/L; no W/R/R- good air movement GI: soft, NT, ND, (+)BS Psychiatric: appropriate- smiling, but still an air of anxiety Neurological: STM issues- getting better Skin: . Gangrene in finger tips- R 2nd digit looks more bent at PIP and now at least 30-45 degrees- and more separation demarcation at PIP- even more daily- - even more flexed at the R 2nd PIP today- no change today - - L hand nails very loose and wiggling no change- 5th digit nail gone and 4th L digit nail barely hanging on.  Feet- B/L tops of toes look much better and great toes no bleeding seen granulating underneath shedding gangrenous changes. Looking much better  Musc: No edema in extremities.  No tenderness in extremities. Motor:  Motor: B/l  UE: Shoulder abduction, elbow flexion/extention 4/5, hand limited to due pain, stable.  RLE: HF, KE 4/5, ADF 0/5 (  pain inhibition) LLE: HF, KE 4/5, ADF 2-/5 (  pain inhibition)   Assessment/Plan: 1. Functional deficits which require 3+ hours per day of interdisciplinary therapy in a comprehensive inpatient rehab setting. Physiatrist is providing close team supervision and 24 hour management of active medical problems listed below. Physiatrist and rehab team continue to assess barriers to discharge/monitor patient progress toward functional and medical goals  Care Tool:  Bathing    Body parts bathed by patient: Right arm, Left arm, Chest, Abdomen, Face, Right upper leg, Left upper leg   Body parts bathed by helper: Front perineal area, Buttocks, Right lower leg, Left lower leg     Bathing assist Assist Level: Moderate Assistance - Patient 50 - 74%     Upper Body Dressing/Undressing Upper body dressing   What is the patient wearing?: Pull over shirt    Upper body assist Assist Level: Minimal Assistance - Patient > 75%    Lower Body Dressing/Undressing Lower body dressing  What is the patient wearing?: Pants     Lower body assist Assist for lower body dressing: Total Assistance - Patient < 25%     Toileting Toileting    Toileting assist Assist for toileting: Maximal Assistance - Patient 25 - 49%     Transfers Chair/bed transfer  Transfers assist     Chair/bed transfer assist level: Moderate Assistance - Patient 50 - 74%     Locomotion Ambulation   Ambulation assist   Ambulation activity did not occur: Safety/medical concerns          Walk 10 feet activity   Assist  Walk 10 feet activity did not occur: Safety/medical concerns        Walk 50 feet activity   Assist Walk 50 feet with 2 turns activity did not occur: Safety/medical concerns         Walk 150 feet activity   Assist Walk 150 feet activity did not occur: Safety/medical  concerns         Walk 10 feet on uneven surface  activity   Assist Walk 10 feet on uneven surfaces activity did not occur: Safety/medical concerns         Wheelchair     Assist Will patient use wheelchair at discharge?: Yes Type of Wheelchair: Manual    Wheelchair assist level: Minimal Assistance - Patient > 75% Max wheelchair distance: 125    Wheelchair 50 feet with 2 turns activity    Assist        Assist Level: Minimal Assistance - Patient > 75%   Wheelchair 150 feet activity     Assist      Assist Level: Supervision/Verbal cueing   Blood pressure (!) 100/58, pulse 77, temperature 98.5 F (36.9 C), temperature source Oral, resp. rate 18, height 5\' 4"  (1.626 m), weight 83 kg, last menstrual period 11/22/2020, SpO2 98 %.   Medical Problem List and Plan: 1.  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  con't PT and OT- still working on SB transfers, but also to go home with hoyer for bad times.   Con't PT and OT_ has done car transfer! 2.  PE/Antithrombotics: -DVT/anticoagulation:  Pharmaceutical: Other (comment)--Continue Eliquis             -antiplatelet therapy: N/A 3. Pain Management: Oxycodone 5mg  q 6 h prn, also  Gabapentin 300mg  BID - increase to TID  Increased gabapentin to 400 mg QID   5/24- will increase Gabapentin to 600 mg QID; will also add Duloxetine 30 mg QHS for pain- if tolerates, will increase to 60 mg and STOP Lexapro at that time  5/30- increase Duloxetine to 60 mg QHS; to help   6/8- pain stable- con't regimen- taking oxy throughout day  6/9- went over all meds- and decided not to wean any other meds except buspar.   6/10- pain stable- con't regimen 4. Mood: LCSW to follow for evaluation and support. Team to offer  ego support.   Increased Lexapro to 20 mg daily on 5/16  5/24- as above, will stop lexapro once increase duloxetine, if tolerates- will also add Buspar for anxiety 5 mg TID.   5/26- mood about the same- con't regimen  5/27-  pt seen by neuropsych   5/30- will decrease lexapro to 10  mg daily since increased Duloxetine.  6/2- improved mood today after seeing Dr Lajoyce Corners-    6/8- bad day today- tearful- if this continues, might need to switch back from Duloxetine to more lexapro?  6/9- Will change buspar to prn- per pt request.              -antipsychotic agents: N/A 5. Neuropsych: This patient is capable of making decisions on her own behalf. 6. Skin/Wound Care:              Prevalon boots for use when in bed. Continue left hand splint.              --Darco shoe for weight bearing per PT input.   5/18- approved to do silvadene and wound care for feet as well as PRAFOs for ankles- to wear at night.  6/2- can wear hospital gloves for shower; changed dressings to dry, stop silvadene;   6/3- nails loose/crumbling on L hand- explained it's likely to lose nail.   6/7- more demarcation of 2nd R digit at PIP- think it won't make it?  6/8- R 2nd finger is main issue- con't bracing and con't to monitor  6/10- pt saw hand surgeon- being sent to hand surgeon in [place], since they live there- con't regimen 7. Fluids/Electrolytes/Nutrition: Monitor  I/Os             Continue protein  supplements to help promote wound healing.  8.  PAF: Monitor HR tid--continue Metoprolol and Cardizem for rate control.             Vitals:   12/19/20 1921 12/20/20 0430  BP: 100/61 (!) 100/58  Pulse: 76 77  Resp: 18 18  Temp: 98.2 F (36.8 C) 98.5 F (36.9 C)  SpO2: 100% 98%     6/10- BP soft, but stable- con't regimen 9. Dysphagia: Continue aspiration precautions--No straws.              Advance diet as tolerated  5/23- SLP still seeing- con't regimen  Intermittent supervision 10. ABLA              Hemoglobin  11.3 on 5/16, labs ordered for tomorrow  5/24- Hb 11.0- con't regimen  11. Forming contractures- DIPs B/L and ankles  Continue PRAFOs nightly also will try and get some "arthritis gloves" for hands to help with forming DIP contractures?  5/23-  con't PRAFOs  5/24- will get resting hand splint made for L hand due to worsening contractures.   5/26- wearing L hand splint/brace- mild improvement in finger curling/contracture formation.   5/27-28- con't regimen, encouraged ROM as possible as well  5/31- pt wearing L hand splint at night and PRAFOs nightly.  12. Loose stools  Improved 13. Vaginal discharge  6/4: UC + for ESBL. Bactrim and precautions ordered, stop date 3 days entered. Provided patient reassurance.   6/6- finishing Septra in next 24 hours- provided a lot of reassurance.  14. Dispo  6/9- doing family training today- d/c 6/13.   6/10- saw hand surgeon- will f/u with one in [place], was given contact info.     LOS: 28 days A FACE TO FACE EVALUATION WAS PERFORMED  Cagney Degrace 12/20/2020, 8:52 AM

## 2020-12-20 NOTE — Progress Notes (Signed)
Speech Language Pathology Daily Session Note  Patient Details  Name: [name] [name] MRN: [id number] Date of Birth: Apr 26, 1961  Today's Date: 12/20/2020 SLP Individual Time: 1400-1430 SLP Individual Time Calculation (min): 30 min  Short Term Goals: Week 4: SLP Short Term Goal 1 (Week 4): STG=LTG  due to ELOS  Skilled Therapeutic Interventions: Skilled SLP treatment performed with focus on cognitive-linguistic goals. SLP facilitated selective attention, complex problem solving, and verbal reasoning task. Patient achieved 80% accuracy and Min A verbal cues for success. Performance appeared impacted by distractibility to external stimuli, as nursing was present administering medication during session. No difficulty with word finding at conversation observed. Patient left in bed with alarm activated and needs within reach. Continue with current plan of care.  Pain Pain Assessment Pain Scale: 0-10 Pain Score: 0-No pain  Therapy/Group: Individual Therapy  Tamala Ser 12/20/2020, 2:41 PM

## 2020-12-21 NOTE — Progress Notes (Signed)
Speech Language Pathology Daily Session Note  Patient Details  Name: [name] [name] MRN: [id number] Date of Birth: [birth year]  Today's Date: 12/21/2020 SLP Individual Time: 1030-1055 SLP Individual Time Calculation (min): 25 min  Short Term Goals: Week 4: SLP Short Term Goal 1 (Week 4): STG=LTG  due to ELOS  Skilled Therapeutic Interventions: Pt seen for skilled ST with focus on cognitive goals. Pt mildly agitated with SLP throughout, upset she has to stay through the weekend before d/c Monday. Pt given extra time and gentle education to increase participation. Complex problem solving for home environment with 90% acc independent. Pt with no difficulty with word finding throughout tx session. Pt left in bed with alarm set and all needs within reach. Cont ST POC.   Pain Pain Assessment Pain Scale: 0-10 Pain Score: 0-No pain  Therapy/Group: Individual Therapy  Tacey Ruiz 12/21/2020, 10:56 AM

## 2020-12-21 NOTE — Progress Notes (Signed)
Occupational Therapy Session Note  Patient Details  Name: [name] [name] MRN: [id number] Date of Birth: 06-27-1961  Today's Date: 12/21/2020 OT Individual Time: 6808-8110 OT Individual Time Calculation (min): 60 min    Skilled Therapeutic Interventions/Progress Updates:    Pt greeted in bed with pain reportedly manageable for tx without additional interventions. She wanted to go outdoors, ADL needs met aside from donning pants, which was completed bedlevel with Max A overall, pt rolling Rt>Lt with Mod A. We openly discussed her OT goals and what she wanted to focus on before d/c home this week. Asked her about toileting with pt adamant that she does not want to use the slideboard to the [facility]. Pt only wanting to proceed with toilet transfer if she can stand. Advised her to pursue this goal with f/u [place]. Toilet transfer goal d/c from [place] as this is not a goal area for her before d/c. She reports she will just use the bedpan and spouse can assist with placement/hygiene. Supine<sit completed with supervision and we donned her B darco shoes. Min A for slideboard<w/c with min cues for maintaining anterior weight shift. Post transfer, pt was escorted to the outdoor patio. Openly discussed the evidence-based concept of mindfulness in regards to holistic pain mgt, anxiety mgt, and stress mgt. Pt reported "I need all that" at start of education. Education emphasis was placed on mindful attention, diaphragmatic breathing, and memory savoring for mental health coping during functional tasks/transfers. We discussed functional implications during CIR therapies and also during her routine at home with family. Pt appreciative, collaborative with therapist during education conversation and participative during deep breathing exercises. She was then returned to the room via w/c. Briefly reviewed pressure relief positions for skin integrity. Pt stating that she is already familiar with these positions, agreeable to sit up  for a few hours and perform pressure relief as advised. OT placed small pads between heels and shoes bilaterally due to pt report of shearing force. Left her sitting up in the w/c, all needs within reach and w/c belt fastened.   Therapy Documentation Precautions:  Precautions Precautions: Fall Precaution Comments: Gangrenous changes in hands Rt>Lt, open foot wounds bilaterally Restrictions Weight Bearing Restrictions: No RLE Weight Bearing: Non weight bearing LLE Weight Bearing: Non weight bearing Other Position/Activity Restrictions: WBAT B LEs while wearing compression socks + Darco shoes Vital Signs: Therapy Vitals Temp: 98.9 F (37.2 C) Temp Source: Oral Pulse Rate: 93 Resp: 16 BP: 102/75 Patient Position (if appropriate): Sitting Oxygen Therapy SpO2: 99 % O2 Device: Room Air  ADL: ADL Eating: Not assessed Grooming: Moderate assistance Where Assessed-Grooming: Bed level Upper Body Bathing: Supervision/safety Where Assessed-Upper Body Bathing: Bed level Lower Body Bathing: Other (comment) (+2 assist) Where Assessed-Lower Body Bathing: Bed level Upper Body Dressing: Moderate assistance Where Assessed-Upper Body Dressing: Bed level Lower Body Dressing: Other (Comment) (+2 assist) Where Assessed-Lower Body Dressing: Bed level Toileting: Other (Comment) (+2 assist) Where Assessed-Toileting: Bed level Toilet Transfer: Not assessed Tub/Shower Transfer: Not assessed ADL Comments: ADL assessment very limited due to heightened pain     Therapy/Group: Individual Therapy  Briane Birden A Georgetta Crafton 12/21/2020, 3:59 PM

## 2020-12-21 NOTE — Progress Notes (Signed)
PROGRESS NOTE   Subjective/Complaints:  Pt reports no issues- asking what will occur to get her meds when she goes home- explained we send Rx's to her pharmacy directly.   ROS:   Pt denies SOB, abd pain, CP, N/V/C/D, and vision changes    Objective:   No results found. No results for input(s): WBC, HGB, HCT, PLT in the last 72 hours.  No results for input(s): NA, K, CL, CO2, GLUCOSE, BUN, CREATININE, CALCIUM in the last 72 hours.   Intake/Output Summary (Last 24 hours) at 12/21/2020 1159 Last data filed at 12/21/2020 0907 Gross per 24 hour  Intake 240 ml  Output 1400 ml  Net -1160 ml        Physical Exam: Vital Signs Blood pressure 98/62, pulse 99, temperature 98.5 F (36.9 C), temperature source Oral, resp. rate 19, height 5\' 4"  (1.626 m), weight 83 kg, last menstrual period 11/22/2020, SpO2 98 %.        General: awake, alert, appropriate, NAD HENT: conjugate gaze; oropharynx moist CV: regular rate; no JVD Pulmonary: CTA B/L; no W/R/R- good air movement GI: soft, NT, ND, (+)BS Psychiatric: appropriate Neurological: alert Skin: . Gangrene in finger tips- R 2nd digit looks more bent at PIP and now at least 30-45 degrees- and more separation demarcation at PIP- even more daily- - even more flexed at the R 2nd PIP today- no change today - - L hand nails very loose and wiggling no change- 5th digit nail gone and 4th L digit nail barely hanging on.  Feet- B/L tops of toes look much better and great toes no bleeding seen granulating underneath shedding gangrenous changes. Looking much better  Musc: No edema in extremities.  No tenderness in extremities. Motor:  Motor: B/l UE: Shoulder abduction, elbow flexion/extention 4/5, hand limited to due pain, stable.  RLE: HF, KE 4/5, ADF 0/5 (  pain inhibition) LLE: HF, KE 4/5, ADF 2-/5 (  pain inhibition)   Assessment/Plan: 1. Functional deficits which require 3+  hours per day of interdisciplinary therapy in a comprehensive inpatient rehab setting. Physiatrist is providing close team supervision and 24 hour management of active medical problems listed below. Physiatrist and rehab team continue to assess barriers to discharge/monitor patient progress toward functional and medical goals  Care Tool:  Bathing    Body parts bathed by patient: Right arm, Left arm, Chest, Abdomen, Face, Right upper leg, Left upper leg   Body parts bathed by helper: Front perineal area, Buttocks, Right lower leg, Left lower leg     Bathing assist Assist Level: Moderate Assistance - Patient 50 - 74%     Upper Body Dressing/Undressing Upper body dressing   What is the patient wearing?: Pull over shirt    Upper body assist Assist Level: Minimal Assistance - Patient > 75%    Lower Body Dressing/Undressing Lower body dressing      What is the patient wearing?: Pants     Lower body assist Assist for lower body dressing: Total Assistance - Patient < 25%     Toileting Toileting    Toileting assist Assist for toileting: Maximal Assistance - Patient 25 - 49%     Transfers  Chair/bed transfer  Transfers assist     Chair/bed transfer assist level: Moderate Assistance - Patient 50 - 74%     Locomotion Ambulation   Ambulation assist   Ambulation activity did not occur: Safety/medical concerns          Walk 10 feet activity   Assist  Walk 10 feet activity did not occur: Safety/medical concerns        Walk 50 feet activity   Assist Walk 50 feet with 2 turns activity did not occur: Safety/medical concerns         Walk 150 feet activity   Assist Walk 150 feet activity did not occur: Safety/medical concerns         Walk 10 feet on uneven surface  activity   Assist Walk 10 feet on uneven surfaces activity did not occur: Safety/medical concerns         Wheelchair     Assist Will patient use wheelchair at discharge?:  Yes Type of Wheelchair: Manual    Wheelchair assist level: Minimal Assistance - Patient > 75% Max wheelchair distance: 125    Wheelchair 50 feet with 2 turns activity    Assist        Assist Level: Minimal Assistance - Patient > 75%   Wheelchair 150 feet activity     Assist      Assist Level: Supervision/Verbal cueing   Blood pressure 98/62, pulse 99, temperature 98.5 F (36.9 C), temperature source Oral, resp. rate 19, height 5\' 4"  (1.626 m), weight 83 kg, last menstrual period 11/22/2020, SpO2 98 %.   Medical Problem List and Plan: 1.  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  con't PT and OT- still working on SB transfers, but also to go home with hoyer for bad times.   Con't PT and OT_ finishing family training 2.  PE/Antithrombotics: -DVT/anticoagulation:  Pharmaceutical: Other (comment)--Continue Eliquis             -antiplatelet therapy: N/A 3. Pain Management: Oxycodone 5mg  q 6 h prn, also  Gabapentin 300mg  BID - increase to TID  Increased gabapentin to 400 mg QID   5/24- will increase Gabapentin to 600 mg QID; will also add Duloxetine 30 mg QHS for pain- if tolerates, will increase to 60 mg and STOP Lexapro at that time  5/30- increase Duloxetine to 60 mg QHS; to help   6/8- pain stable- con't regimen- taking oxy throughout day  6/9- went over all meds- and decided not to wean any other meds except buspar.   6/11- pain stable- con't regimen 4. Mood: LCSW to follow for evaluation and support. Team to offer ego support.   Increased Lexapro to 20 mg daily on 5/16  5/24- as above, will stop lexapro once increase duloxetine, if tolerates- will also add Buspar for anxiety 5 mg TID.   5/26- mood about the same- con't regimen  5/27-  pt seen by  neuropsych   5/30- will decrease lexapro to 10 mg daily since increased Duloxetine.  6/2- improved mood today after seeing Dr 6/27-    6/8- bad day today- tearful- if this continues, might need to switch back from Duloxetine to more lexapro?  6/9- Will change buspar to prn- per pt request.   6/11- if not taking don't  send home on Buspar             -antipsychotic agents: N/A 5. Neuropsych: This patient is capable of making decisions on her own behalf. 6. Skin/Wound Care:              Prevalon boots for use when in bed. Continue left hand splint.              --Darco shoe for weight bearing per PT input.   5/18- approved to do silvadene and wound care for feet as well as PRAFOs for ankles- to wear at night.  6/2- can wear hospital gloves for shower; changed dressings to dry, stop silvadene;   6/3- nails loose/crumbling on L hand- explained it's likely to lose nail.   6/7- more demarcation of 2nd R digit at PIP- think it won't make it?  6/8- R 2nd finger is main issue- con't bracing and con't to monitor  6/10- pt saw hand surgeon- being sent to hand surgeon in [place], since they live there- con't regimen 7. Fluids/Electrolytes/Nutrition: Monitor  I/Os             Continue protein  supplements to help promote wound healing.  8.  PAF: Monitor HR tid--continue Metoprolol and Cardizem for rate control.             Vitals:   12/20/20 2039 12/21/20 0541  BP: (!) 98/54 98/62  Pulse: 79 99  Resp: 17 19  Temp: 98.5 F (36.9 C) 98.5 F (36.9 C)  SpO2: 100% 98%     6/11- BP soft- will need to see Cards about Afib when d/c'd.  9. Dysphagia: Continue aspiration precautions--No straws.              Advance diet as tolerated  5/23- SLP still seeing- con't regimen  Intermittent supervision 10. ABLA              Hemoglobin 11.3 on 5/16, labs ordered for tomorrow  5/24- Hb 11.0- con't regimen  11. Forming contractures- DIPs B/L and ankles  Continue PRAFOs nightly also will try and get some  "arthritis gloves" for hands to help with forming DIP contractures?  5/23-  con't PRAFOs  5/24- will get resting hand splint made for L hand due to worsening contractures.   5/26- wearing L hand splint/brace- mild improvement in finger curling/contracture formation.   5/27-28- con't regimen, encouraged ROM as possible as well  5/31- pt wearing L hand splint at night and PRAFOs nightly.  12. Loose stools  Improved 13. Vaginal discharge  6/4: UC + for ESBL. Bactrim and precautions ordered, stop date 3 days entered. Provided patient reassurance.   6/6- finishing Septra in next 24 hours- provided a lot of reassurance.  14. Dispo  6/9- doing family training today- d/c 6/13.   6/10- saw hand surgeon- will f/u with one in [place], was given contact info.   6/11- will also need f/u with Cards due to Afib - which could just be from Septic shock?; Uses [facility] in [place], [place].     LOS: 29 days A FACE TO FACE EVALUATION WAS PERFORMED  [name] [name] 12/21/2020, 11:59 AM

## 2020-12-22 NOTE — Progress Notes (Signed)
Physical Therapy Session Note  Patient Details  Name: Emmaleah Meroney MRN: [id number] Date of Birth: 06/24/1961  Today's Date: 12/22/2020 PT Individual Time: 1030-1100 PT Individual Time Calculation (min): 30 min   Short Term Goals: Week 3: STG=LTG due to ELOS.  Skilled Therapeutic Interventions/Progress Updates:     Patient in bed upon PT arrival. Patient alert and agreeable to PT session. Patient denied pain during session. Focused session on household mobility in preparation for d/c.   Therapeutic Activity: Bed Mobility: Patient performed supine to sit with mod I for increased time in a flat bed without use of bed rails. Patient required total A to don B Darco heel shoes while sitting EOB independently. Patient able to cue therapist on how to don shoes since she is dependent with this task and will need another person to assist due to lower extremity weakness.  Transfers: Patient performed a slide board transfer bed>w/c with min A  and max A for board placement. Patient able to teach back hand placement, board placement, and head-hips relationship for proper technique with transfers from previous sessions.   Wheelchair Mobility:  Patient propelled wheelchair 10 feet and 50 feet with supervision. Provided verbal cues for hand placement to protect distal end of her fingers during propulsion. Attempted with large w/c gloves to improve patient's palm grip, however, no improvement and decreased traction with gloves donned. Patient able to don/doff gloves with mod A to protect her finger tips. Demonstrated donning/doffing leg rests with loaner chair, patient able to perform independently following demonstration. Also reviewed removing, replacing, and adjusting arm rests during session.   Discussed care for her fingers and progression of shedding of necrotic tissue and nails. Patient well informed on this process. Reinforced hand hygiene and infection prevention with use of hands. Patient  appreciative of discussion.   Patient in Day room handed off to [place], [place], to participate in dance group at end of session with breaks locked.   Therapy Documentation Precautions:  Precautions Precautions: Fall Precaution Comments: Gangrenous changes in hands Rt>Lt, open foot wounds bilaterally Restrictions Weight Bearing Restrictions: No RLE Weight Bearing: Non weight bearing LLE Weight Bearing: Non weight bearing Other Position/Activity Restrictions: WBAT B LEs while wearing compression socks + Darco shoes    Therapy/Group: Individual Therapy  Owain Eckerman L Chyler Creely PT, DPT  12/22/2020, 4:56 PM

## 2020-12-22 NOTE — Plan of Care (Signed)
  Problem: RH Toilet Transfers Goal: LTG Patient will perform toilet transfers w/assist (OT) Description: LTG: Patient will perform toilet transfers with assist, with/without cues using equipment (OT) Outcome: Not Applicable Note: Goal d/c as pt reports this is not a goal area of hers at time of CIR d/c

## 2020-12-22 NOTE — Progress Notes (Signed)
Occupational Therapy Session Note  Patient Details  Name: [name] [name] MRN: [id number] Date of Birth: 1961/01/10  Today's Date: 12/22/2020 OT Individual Time: 5916-3846 OT Individual Time Calculation (min): 42 min   Skilled Therapeutic Interventions/Progress Updates:    Pt greeted in bed with pain reportedly manageable for tx. She wanted to start session by donning pants. Mod A overall to meet task demands bedlevel, OT assisting pt with threading fabric over B foot dressings. She did well with rolling Rt>Lt with increased time while pulling up pants, Min A to fully elevate. Supervision for supine<sit using the bedrail, HOB elevated. B Darco shoes with heel pads were then donned by therapist. Slideboard<w/c completed with Min A and vcs for head/hip relationships + maintaining anterior weight shift. Pt  then engaged in oral care, face washing, and hand washing while sitting at the sink, using adaptive strategies as needed to protect distal gangrenous aspects of certain digits. Afterwards slideboard<bed completed with the same assistance/cuing, also some cues for safe hand placement. Min A to lift LEs into bed and she was able to boost herself up using bedrails with bed placed in trendelenburg position. Left pt with all needs within reach and 4 bedrails up of air mattress.   Therapy Documentation Precautions:  Precautions Precautions: Fall Precaution Comments: Gangrenous changes in hands Rt>Lt, open foot wounds bilaterally Restrictions Weight Bearing Restrictions: No RLE Weight Bearing: Non weight bearing LLE Weight Bearing: Non weight bearing Other Position/Activity Restrictions: WBAT B LEs while wearing compression socks + Darco shoes  Pain: Pain Assessment Pain Scale: Faces Faces Pain Scale: No hurt ADL: ADL Eating: Not assessed Grooming: Moderate assistance Where Assessed-Grooming: Bed level Upper Body Bathing: Supervision/safety Where Assessed-Upper Body Bathing: Bed level Lower  Body Bathing: Other (comment) (+2 assist) Where Assessed-Lower Body Bathing: Bed level Upper Body Dressing: Moderate assistance Where Assessed-Upper Body Dressing: Bed level Lower Body Dressing: Other (Comment) (+2 assist) Where Assessed-Lower Body Dressing: Bed level Toileting: Other (Comment) (+2 assist) Where Assessed-Toileting: Bed level Toilet Transfer: Not assessed Tub/Shower Transfer: Not assessed ADL Comments: ADL assessment very limited due to heightened pain      Therapy/Group: Individual Therapy  [name] [name] 12/22/2020, 12:35 PM

## 2020-12-22 NOTE — Progress Notes (Signed)
Speech Language Pathology Discharge Summary  Patient Details  Name: [name] [name] MRN: [id number] Date of Birth: [birth year]  Today's Date: 12/22/2020 SLP Individual Time: 0850-0930 SLP Individual Time Calculation (min): 40 min   Skilled Therapeutic Interventions:  Skilled slp intervention focused on cognition and dc education. Pt instructed to get a wall calendar or planner to keep track of all appointments. Pt reports husband has already purchased a pill organizer for medications. Pt educated on eliminating distractions when completing tasks that require her attention and educated on word finding strategies she can use in conversation. Pt dc'ing to home tomorrow with husband.     Patient has met 4 of 4 long term goals.  Patient to discharge at overall Modified Independent level.  Reasons goals not met:     Clinical Impression/Discharge Summary:   Pt has met 4 of 4 long term goals this admission due to improved problem solving, memory, word finding, and attention. She continues to require occasional cues for awareness of errors with higher level tasks. Discharge education has been completed with patient and husband and she will discharge home with 24 hour supervision from husband.    Caregiver Able to Provide Assistance: Yes  Type of Caregiver Assistance: Physical;Cognitive  Recommendation:  None      Equipment: no equipment recommended at this time.   Reasons for discharge: Discharged from hospital        [place] 12/22/2020, 9:25 AM

## 2020-12-22 NOTE — Progress Notes (Signed)
PROGRESS NOTE   Subjective/Complaints:  Pt excited d/c is tomorrow-  Asking who to assess legs/feet after d/c- explained I suggest that hand surgeon send to [facility]- not podiatrist.   LBM this AM- mushy  ROS:   Pt denies SOB, abd pain, CP, N/V/C/D, and vision changes  Objective:   No results found. No results for input(s): WBC, HGB, HCT, PLT in the last 72 hours.  No results for input(s): NA, K, CL, CO2, GLUCOSE, BUN, CREATININE, CALCIUM in the last 72 hours.   Intake/Output Summary (Last 24 hours) at 12/22/2020 1245 Last data filed at 12/22/2020 0740 Gross per 24 hour  Intake 838 ml  Output 975 ml  Net -137 ml        Physical Exam: Vital Signs Blood pressure 101/61, pulse 99, temperature 98.5 F (36.9 C), resp. rate 20, height 5\' 4"  (1.626 m), weight 82.6 kg, last menstrual period 11/22/2020, SpO2 94 %.         General: awake, alert, appropriate, Nurse and OT in room; NAD HENT: conjugate gaze; oropharynx moist CV: regular rate; no JVD Pulmonary: CTA B/L; no W/R/R- good air movement GI: soft, NT, ND, (+)BS Psychiatric: appropriate- more excited than anxious Neurological: alert- STM issues better  Skin: . Gangrene in finger tips- R 2nd digit looks more bent at PIP and now at least 30-45 degrees- and more separation demarcation at PIP- even more daily- - even more flexed at the R 2nd PIP today- no change L hand nails are gone except 4th digit 2nd-5th Feet- look so much better on the tops- however bottom- R heel is gangrenous- and L heel/base of foot- flaking thick skin otherwise.  Musc: No edema in extremities.  No tenderness in extremities. Motor:  Motor: B/l UE: Shoulder abduction, elbow flexion/extention 4/5, hand limited to due pain, stable.  RLE: HF, KE 4/5, ADF 0/5 (  pain inhibition) LLE: HF, KE 4/5, ADF 2-/5 (  pain inhibition)   Assessment/Plan: 1. Functional deficits which  require 3+ hours per day of interdisciplinary therapy in a comprehensive inpatient rehab setting. Physiatrist is providing close team supervision and 24 hour management of active medical problems listed below. Physiatrist and rehab team continue to assess barriers to discharge/monitor patient progress toward functional and medical goals  Care Tool:  Bathing    Body parts bathed by patient: Right arm, Left arm, Chest, Abdomen, Face, Right upper leg, Left upper leg   Body parts bathed by helper: Front perineal area, Buttocks, Right lower leg, Left lower leg     Bathing assist Assist Level: Moderate Assistance - Patient 50 - 74%     Upper Body Dressing/Undressing Upper body dressing   What is the patient wearing?: Pull over shirt    Upper body assist Assist Level: Minimal Assistance - Patient > 75%    Lower Body Dressing/Undressing Lower body dressing      What is the patient wearing?: Pants     Lower body assist Assist for lower body dressing: Moderate Assistance - Patient 50 - 74%     Toileting Toileting    Toileting assist Assist for toileting: Maximal Assistance - Patient 25 - 49%  Transfers Chair/bed transfer  Transfers assist     Chair/bed transfer assist level: Moderate Assistance - Patient 50 - 74%     Locomotion Ambulation   Ambulation assist   Ambulation activity did not occur: Safety/medical concerns          Walk 10 feet activity   Assist  Walk 10 feet activity did not occur: Safety/medical concerns        Walk 50 feet activity   Assist Walk 50 feet with 2 turns activity did not occur: Safety/medical concerns         Walk 150 feet activity   Assist Walk 150 feet activity did not occur: Safety/medical concerns         Walk 10 feet on uneven surface  activity   Assist Walk 10 feet on uneven surfaces activity did not occur: Safety/medical concerns         Wheelchair     Assist Will patient use wheelchair at  discharge?: Yes Type of Wheelchair: Manual    Wheelchair assist level: Minimal Assistance - Patient > 75% Max wheelchair distance: 125    Wheelchair 50 feet with 2 turns activity    Assist        Assist Level: Minimal Assistance - Patient > 75%   Wheelchair 150 feet activity     Assist      Assist Level: Supervision/Verbal cueing   Blood pressure 101/61, pulse 99, temperature 98.5 F (36.9 C), resp. rate 20, height 5\' 4"  (1.626 m), weight 82.6 kg, last menstrual period 11/22/2020, SpO2 94 %.   Medical Problem List and Plan: 1.  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  con't PT and OT- still working on SB transfers, but also to go home with hoyer for bad times.   -con't PT and OT_ d/c tomrrow 2.  PE/Antithrombotics: -DVT/anticoagulation:  Pharmaceutical: Other (comment)--Continue Eliquis             -antiplatelet therapy: N/A 3. Pain Management: Oxycodone 5mg  q 6 h prn, also  Gabapentin 300mg  BID - increase to TID  Increased gabapentin to 400 mg QID   5/24- will increase Gabapentin to 600 mg QID; will also add Duloxetine 30 mg QHS for pain- if tolerates, will increase to 60 mg and STOP Lexapro at that time  5/30- increase Duloxetine to 60 mg QHS; to help   6/8- pain stable- con't regimen- taking oxy throughout day  6/9- went over all meds- and decided not to wean any other meds except buspar.   6/11-6/12- pain stable- aking less oxy- will go home on 7 days Rx and call if needs more? But will need to see me in clnic if needs pain meds from me.  4. Mood: LCSW to follow for evaluation and support. Team to offer ego support.   Increased Lexapro to 20 mg daily on 5/16  5/24- as above, will stop lexapro once increase duloxetine, if tolerates- will also add  Buspar for anxiety 5 mg TID.   5/26- mood about the same- con't regimen  5/27-  pt seen by neuropsych   5/30- will decrease lexapro to 10 mg daily since increased Duloxetine.  6/2- improved mood today after seeing Dr 6/27-    6/8- bad day today- tearful- if this continues, might need  to switch back from Duloxetine to more lexapro?  6/9- Will change buspar to prn- per pt request.   6/11- if not taking don't send home on Buspar             -antipsychotic agents: N/A 5. Neuropsych: This patient is capable of making decisions on her own behalf. 6. Skin/Wound Care:              Prevalon boots for use when in bed. Continue left hand splint.              --Darco shoe for weight bearing per PT input.   5/18- approved to do silvadene and wound care for feet as well as PRAFOs for ankles- to wear at night.  6/2- can wear hospital gloves for shower; changed dressings to dry, stop silvadene;   6/3- nails loose/crumbling on L hand- explained it's likely to lose nail.   6/7- more demarcation of 2nd R digit at PIP- think it won't make it?  6/8- R 2nd finger is main issue- con't bracing and con't to monitor  6/10- pt saw hand surgeon- being sent to hand surgeon in [place], since they live there- con't regimen 7. Fluids/Electrolytes/Nutrition: Monitor  I/Os             Continue protein  supplements to help promote wound healing.  8.  PAF: Monitor HR tid--continue Metoprolol and Cardizem for rate control.             Vitals:   12/21/20 2007 12/22/20 0513  BP: 99/75 101/61  Pulse: 85 99  Resp: 20 20  Temp: 98.5 F (36.9 C) 98.5 F (36.9 C)  SpO2: 98% 94%     6/11- BP soft- will need to see Cards about Afib when d/c'd.  9. Dysphagia: Continue aspiration precautions--No straws.              Advance diet as tolerated  5/23- SLP still seeing- con't regimen  Intermittent supervision 10. ABLA              Hemoglobin 11.3 on 5/16, labs ordered for tomorrow  5/24- Hb 11.0- con't regimen  11. Forming  contractures- DIPs B/L and ankles  Continue PRAFOs nightly also will try and get some "arthritis gloves" for hands to help with forming DIP contractures?  5/23-  con't PRAFOs  5/24- will get resting hand splint made for L hand due to worsening contractures.   5/26- wearing L hand splint/brace- mild improvement in finger curling/contracture formation.   5/27-28- con't regimen, encouraged ROM as possible as well  5/31- pt wearing L hand splint at night and PRAFOs nightly.  12. Loose stools  Improved 13. Vaginal discharge  6/4: UC + for ESBL. Bactrim and precautions ordered, stop date 3 days entered. Provided patient reassurance.   6/6- finishing Septra in next 24 hours- provided a lot of reassurance.  14. Dispo  6/9- doing family training today- d/c 6/13.   6/10- saw hand surgeon- will f/u with one in [place], was given contact info.   6/11- will also need f/u with Cards due to Afib - which could just be from Septic shock?; Uses [facility] in [place], [place].   6/12- will also need plastic surgeon for feet   LOS: 30 days A FACE TO FACE EVALUATION WAS PERFORMED  Tanveer Brammer 12/22/2020, 12:45 PM

## 2020-12-23 ENCOUNTER — Other Ambulatory Visit: Payer: Self-pay | Admitting: Physical Medicine and Rehabilitation

## 2020-12-23 LAB — CBC
HCT: 36.3 % (ref 36.0–46.0)
Hemoglobin: 11.3 g/dL — ABNORMAL LOW (ref 12.0–15.0)
MCH: 29.8 pg (ref 26.0–34.0)
MCHC: 31.1 g/dL (ref 30.0–36.0)
MCV: 95.8 fL (ref 80.0–100.0)
Platelets: 347 10*3/uL (ref 150–400)
RBC: 3.79 MIL/uL — ABNORMAL LOW (ref 3.87–5.11)
RDW: 15.9 % — ABNORMAL HIGH (ref 11.5–15.5)
WBC: 6.7 10*3/uL (ref 4.0–10.5)
nRBC: 0 % (ref 0.0–0.2)

## 2020-12-23 LAB — BASIC METABOLIC PANEL
Anion gap: 7 (ref 5–15)
BUN: <5 mg/dL — ABNORMAL LOW (ref 6–20)
CO2: 27 mmol/L (ref 22–32)
Calcium: 9.5 mg/dL (ref 8.9–10.3)
Chloride: 104 mmol/L (ref 98–111)
Creatinine, Ser: 0.5 mg/dL (ref 0.44–1.00)
GFR, Estimated: >60 mL/min (ref >60)
Glucose, Bld: 101 mg/dL — ABNORMAL HIGH (ref 70–99)
Potassium: 3.7 mmol/L (ref 3.5–5.1)
Sodium: 138 mmol/L (ref 135–145)

## 2020-12-23 MED ORDER — GABAPENTIN 600 MG PO TABS: 600.0000 mg | ORAL_TABLET | Freq: Three times a day (TID) | ORAL | 0 refills | Status: AC

## 2020-12-23 MED ORDER — HYDROCERIN EX CREA: 1.0000 "application " | TOPICAL_CREAM | Freq: Two times a day (BID) | CUTANEOUS | 0 refills | Status: AC

## 2020-12-23 MED ORDER — ELIQUIS 5 MG PO TABS: 5.0000 mg | ORAL_TABLET | Freq: Two times a day (BID) | ORAL | 0 refills | Status: AC

## 2020-12-23 MED ORDER — POTASSIUM CHLORIDE CRYS ER 20 MEQ PO TBCR: 20.0000 meq | EXTENDED_RELEASE_TABLET | Freq: Every day | ORAL | 0 refills | Status: AC

## 2020-12-23 MED ORDER — METOPROLOL TARTRATE 25 MG PO TABS: 12.5000 mg | ORAL_TABLET | Freq: Two times a day (BID) | ORAL | 0 refills | Status: DC

## 2020-12-23 MED ORDER — PANTOPRAZOLE SODIUM 40 MG PO TBEC: 40.0000 mg | DELAYED_RELEASE_TABLET | Freq: Every day | ORAL | 0 refills | Status: AC

## 2020-12-23 MED ORDER — BUSPIRONE HCL 5 MG PO TABS: 5.0000 mg | ORAL_TABLET | Freq: Three times a day (TID) | ORAL | 0 refills | Status: DC | PRN

## 2020-12-23 MED ORDER — CLONAZEPAM 0.5 MG PO TABS: 0.5000 mg | ORAL_TABLET | Freq: Two times a day (BID) | ORAL | 0 refills | Status: AC | PRN

## 2020-12-23 MED ORDER — ZINC SULFATE 220 (50 ZN) MG PO CAPS: 220.0000 mg | ORAL_CAPSULE | Freq: Every day | ORAL | 0 refills | Status: AC

## 2020-12-23 MED ORDER — DULOXETINE HCL 60 MG PO CPEP: 60.0000 mg | ORAL_CAPSULE | Freq: Every day | ORAL | 0 refills | Status: AC

## 2020-12-23 MED ORDER — MELATONIN 3 MG PO TABS: 3.0000 mg | ORAL_TABLET | Freq: Every day | ORAL | 0 refills | Status: AC

## 2020-12-23 MED ORDER — OXYCODONE HCL 5 MG PO TABS: 5.0000 mg | ORAL_TABLET | Freq: Two times a day (BID) | ORAL | 0 refills | Status: AC | PRN

## 2020-12-23 MED ORDER — ESCITALOPRAM OXALATE 10 MG PO TABS: 10.0000 mg | ORAL_TABLET | Freq: Every day | ORAL | 0 refills | Status: AC

## 2020-12-23 MED ORDER — ASCORBIC ACID 250 MG PO TABS: 250.0000 mg | ORAL_TABLET | Freq: Two times a day (BID) | ORAL | 0 refills | Status: AC

## 2020-12-23 MED ORDER — TRAZODONE HCL 50 MG PO TABS: 25.0000 mg | ORAL_TABLET | Freq: Every evening | ORAL | 0 refills | Status: DC | PRN

## 2020-12-23 MED ORDER — DILTIAZEM HCL ER 60 MG PO CP12: 60.0000 mg | ORAL_CAPSULE | Freq: Two times a day (BID) | ORAL | 0 refills | Status: AC

## 2020-12-23 NOTE — Discharge Summary (Signed)
Physician Discharge Summary  Patient ID: [name] [name] MRN: [id number] DOB/AGE: October 06, 1960 60 y.o.  Admit date: 11/22/2020 Discharge date: 12/23/2020  Discharge Diagnoses:  Principal Problem:   Intensive care (ICU) myopathy Active Problems:   Gangrene of both feet (HCC)   Gangrene of finger of both hands (HCC)   Debility   Dysphagia   Depression   Neuropathic pain of both feet   Discharged Condition: good  Significant Diagnostic Studies: DG Finger Index Right  Result Date: 12/05/2020 CLINICAL DATA:  Wound drainage EXAM: RIGHT INDEX FINGER 2+V COMPARISON:  None. FINDINGS: No fracture or malalignment. No periostitis or bone destruction. Generalized soft tissue swelling with tapered appearance of soft tissues at the level of distal phalanx. IMPRESSION: No acute osseous abnormality Electronically Signed   By: Donavan Foil M.D.   On: 12/05/2020 22:07    Labs:  Basic Metabolic Panel: BMP Latest Ref Rng & Units 12/23/2020 12/16/2020 12/09/2020  Glucose 70 - 99 mg/dL 101(H) 99 108(H)  BUN 6 - 20 mg/dL <5(L) 6 7  Creatinine 0.44 - 1.00 mg/dL 0.50 0.59 0.40(L)  Sodium 135 - 145 mmol/L 138 136 135  Potassium 3.5 - 5.1 mmol/L 3.7 3.8 3.8  Chloride 98 - 111 mmol/L 104 102 100  CO2 22 - 32 mmol/L '27 27 26  ' Calcium 8.9 - 10.3 mg/dL 9.5 9.3 9.0     CBC: CBC Latest Ref Rng & Units 12/23/2020 12/16/2020 12/09/2020  WBC 4.0 - 10.5 K/uL 6.7 7.1 7.1  Hemoglobin 12.0 - 15.0 g/dL 11.3(L) 11.7(L) 10.9(L)  Hematocrit 36.0 - 46.0 % 36.3 36.8 34.3(L)  Platelets 150 - 400 K/uL 347 327 311     Hepatic function Panel: Hepatic Function Latest Ref Rng & Units 11/25/2020 11/18/2020 11/04/2020  Total Protein 6.5 - 8.1 g/dL 7.6 6.8 5.9(L)  Albumin 3.5 - 5.0 g/dL 2.8(L) 2.4(L) 1.9(L)  AST 15 - 41 U/L 35 22 22  ALT 0 - 44 U/L '27 17 18  ' Alk Phosphatase 38 - 126 U/L 123 87 69  Total Bilirubin 0.3 - 1.2 mg/dL 0.7 0.6 0.6    CBG: No results for input(s): GLUCAP in the last 168 hours.  Brief HPI:   [name] [name] is a 60 y.o. female with history of HTN, psoriatic arthritis, major depression with anxiety disorder who was admitted to [facility] on 09/21/2020 with septic shock due to strep pyogenes.  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  She was treated with multiple antibiotics as well as IVIG, extubated to [place] and was discharged to [facility] on 11/02/2018 for further management.   She continued to have issues with leukocytosis, fevers and delirium.  She was treated with multiple antibiotics which were narrowed to cefepime and Flagyl for 2 weeks.  Metoprolol was titrated upwards and amiodarone was weaned off per Dr. Doylene Canard.  Diet was initiated and slowly advanced to dysphagia 3 without straws.  Dr. Sharol Given was consulted for input on gangrenous changes of BLE and recommended local care with Vive compression stockings to be worn at all times. She continued to have deficits due to ICU myopathy with debility.  Therapy was ongoing and CIR was recommended due to functional decline.   Hospital Course: [name] was admitted to rehab 11/22/2020 for inpatient therapies to consist of PT, ST and OT at least three hours five days a week.  Past admission physiatrist, therapy team and rehab RN have worked together to provide customized collaborative inpatient rehab. Her blood pressures were monitored on TID basis and have been stable.  Serial check of CBC showed that ABLA is resolving and white count has been stable. Check of BMET showed that lytes and renal status is WNL.  Multivitamins as well as protein supplements were added to promote healing and patient was instructed to continue this till wound completely healed up.   Wounds on bilateral hands and feet have been monitored during her stay.  She declined Vive  compression stockings due to discomfort and Dr. Jess Barters office was contacted for input. Silvadene ointment with dry dressings were initially ordered as wound care.   As wounds continued to heal without drainage, Dr. Sharol Given recommended transitioning to dry dressings on 06/02. She is weightbearing as tolerated on BLE with Darco shoes and has been advised to wear compression stockings if tolerated and lieu of dry dressings.  She did develop some purulent drainage from wound bed of right index finger which was expressed and drained.  She was treated with a week course of doxycycline for wound prophylaxis.    Dr. Luvenia Starch surgery was consulted for input and felt that patient would likely require amputation necrotic portions and that this was not emergent. He also felt that as long as there were no signs of infection, digits could be monitored and allowed to heal without surgical intervention.  To continue keeping areas clean and dry and follow-up with Ortho closer to [place] after discharge. Her anxiety levels have improved with ego support provided by rehab team. Dr. Rodenbough/neuropsychologist has also worked with her on coping and adjustment issues. Gabapentin was titrated upwards and Cymbalta was added to help  manage neuropathic symptoms. Lexapro was added for mood stabilization with and Buspar for high levels of anxiety affecting activity.   Her pain control has improved and she is using oxycodone on limited basis. Buspar was made prn prior to discharge per patient request. She has made steady gains however continues to be limited by  decreased activity tolerance, intermittent elevated anxiety levels as well as discomfort  in bilateral hands and fingers.  She currently requires mod assist at wheelchair level.  S She plans on follow up with ortho closer to home and has been given contact information of MD involved in care for questions prn. he will continue to receive home health therapy after  discharge.   Rehab course: During patient's stay in rehab weekly team conferences were held to monitor patient's progress, set goals and discuss barriers to discharge. At admission, patient required total assist with ADL tasks and with mobility. She exhibited mild cognitive deficits with higher level word finding deficits and denied any changes in swallow or speech therefore was advanced to regular textures. She  has had improvement in activity tolerance, balance, postural control as well as ability to compensate for deficits. She is able to complete ADL tasks with mod assist. She requires mod assist with sliding board to perform lateral scoot transfers.   She has had improvement in cognitive status and is able to perform She is able to propel her wheelchair with contact-guard assist.  Family education was completed with husband.  Disposition: Home  Diet: Regular.   Special Instructions: Dry dressings to bilateral feet.  Use Darco shoe for pressure-relief measures. Follow-up with PCP for repeat labs in 1 to 2 weeks. 3.  Continue protein supplements 2-3 times a day to help promote healing.  Allergies as of 12/23/2020       Reactions   Apremilast    Other reaction(s): Migraines   Nabumetone    Other reaction(s): GI Problems   Prednisone    Other reaction(s): depression        Medication List     STOP taking these medications    diltiazem 90 MG tablet Commonly known as: CARDIZEM Replaced by: diltiazem 60 MG 12 hr capsule   fiber Pack packet   gabapentin 300 MG capsule Commonly known as: NEURONTIN Replaced by: gabapentin 600 MG tablet   scopolamine 1 MG/3DAYS Commonly known as: TRANSDERM-SCOP       TAKE these medications    ascorbic acid 250 MG tablet Commonly known as: VITAMIN C Take 1 tablet (250 mg total) by mouth 2 (two) times daily.   busPIRone 5 MG tablet Commonly known as: BUSPAR Take 1 tablet (5 mg total) by mouth 3 (three) times daily as needed (anxiety-  use first).   clonazePAM 0.5 MG tablet Commonly known as: KLONOPIN Take 1 tablet (0.5 mg total) by mouth 2 (two) times daily as needed (anxiety or insomnia).   DIGESTIVE ADVANTAGE PO Place 1 capsule into feeding tube daily.   diltiazem 60 MG 12 hr capsule Commonly known as: CARDIZEM SR Take 1 capsule (60 mg total) by mouth every 12 (twelve) hours. Replaces: diltiazem 90 MG tablet   DULoxetine 60 MG capsule Commonly known as: CYMBALTA Take 1 capsule (60 mg total) by mouth at bedtime.   Eliquis 5 MG Tabs tablet Generic drug: apixaban Take 1 tablet (5 mg total) by mouth 2 (two) times daily.   escitalopram 10 MG tablet Commonly known as: LEXAPRO Take 1 tablet (10 mg total) by mouth daily.   gabapentin 600 MG tablet Commonly known as: Neurontin Take 1 tablet (600 mg total) by mouth 4 (four) times daily - after meals and at bedtime. Replaces: gabapentin 300 MG capsule   hydrocerin Crea Apply 1 application topically 2 (two) times daily.   melatonin 3 MG Tabs tablet Take 1 tablet (3 mg total) by mouth at bedtime.   metoprolol tartrate 25 MG tablet Commonly known as: LOPRESSOR Take 0.5 tablets (12.5 mg total) by mouth in the morning and at bedtime.   multivitamin tablet Take 1 tablet by mouth daily.   oxyCODONE 5 MG immediate release tablet--Rx# 14 pills Commonly known as: Oxy IR/ROXICODONE Take 1 tablet (5 mg total) by mouth 2 (two) times daily as needed for severe pain.   pantoprazole 40 MG tablet Commonly known as: PROTONIX Take 1 tablet (40 mg total) by mouth daily.   potassium chloride SA 20 MEQ tablet Commonly known as: KLOR-CON Take 1 tablet (20 mEq total) by mouth daily.   traZODone 50 MG tablet Commonly known as: DESYREL Take 0.5-1 tablets (25-50 mg total) by mouth at bedtime as needed for sleep.   zinc sulfate 220 (50 Zn) MG capsule Take 1 capsule (220 mg total) by mouth daily. Start taking on: December 24, 2020        Follow-up Information      Lovorn, Jinny Blossom, MD Follow up.   Specialty: Physical Medicine and Rehabilitation Why: office will call with appointment Contact information: [address] [address] [phone]         Leanora Cover, MD. Call.   Specialty: Orthopedic Surgery Why: as needed Contact information: [place] [address] [phone]         Newt Minion, MD. Call.  Specialty: Orthopedic Surgery Contact information: [address] [place] [address] [phone]                 Signed: Bary Leriche 12/23/2020, 10:46 AM

## 2020-12-23 NOTE — Progress Notes (Signed)
Patient ID: [name] [name], female   DOB: [birth year], 60 y.o.   MRN: [id number]  Patient declined by [facility].  Pt HH referral sent to [facility].  [place], [place] [phone]

## 2020-12-23 NOTE — Progress Notes (Signed)
Patient ID: [name] [name], female   DOB: 1960/10/10, 60 y.o.   MRN: [id number]  Patient PCP [facility] [phone]

## 2020-12-23 NOTE — Progress Notes (Signed)
Occupational Therapy Discharge Summary  Patient Details  Name: [name] [name] MRN: [id number] Date of Birth: 1961/02/16   Patient has met 2 of 5 long term goals due to improved activity tolerance, improved balance, postural control, ability to compensate for deficits, and improved coordination.  Patient to discharge at overall Mod Assist level.  Patient's care partner is independent to provide the necessary physical assistance at discharge.  [name] has been present for several OT sessions, he has been trained to assist with ADLs at bed/chair level. Pt has been limited during her stay d/t anxiety and emotional strain limiting participation at times. Pt consistently performing slide board transfers with Min A and progressing to squat pivots with Mod A, standing in [place] with Mod/Max of 1.   Reasons goals not met: Pt did not meet toileting, LB dressing, and bathing goal. Pt had min A and is currently Mod overall with fluctuating performance.   Recommendation:  Patient will benefit from ongoing skilled OT services in home health setting to continue to advance functional skills in the area of BADL.  Equipment: DAC , hoyer  Reasons for discharge: discharge from hospital  Patient/family agrees with progress made and goals achieved: Yes  OT Discharge Precautions/Restrictions  Precautions Precautions: Fall Precaution Comments: Gangrenous changes in hands Rt>Lt, open foot wounds bilaterally Restrictions Weight Bearing Restrictions: No RLE Weight Bearing: Non weight bearing LLE Weight Bearing: Non weight bearing Other Position/Activity Restrictions: WBAT B LEs while wearing  Darco shoes ADL ADL Eating: Set up Grooming: Setup, Supervision/safety Where Assessed-Grooming: Bed level Upper Body Bathing: Supervision/safety Where Assessed-Upper Body Bathing: Bed level Lower Body Bathing: Moderate assistance Where Assessed-Lower Body Bathing: Bed level Upper Body Dressing: Minimal  assistance Where Assessed-Upper Body Dressing: Bed level Lower Body Dressing: Moderate assistance Where Assessed-Lower Body Dressing: Bed level Toileting: Maximal assistance Where Assessed-Toileting: Bed level Toilet Transfer: Moderate assistance (slideboard) Toilet Transfer Method: Educational psychologist: Not assessed ADL Comments: ADL assessment very limited due to heightened pain Vision Baseline Vision/History: Wears glasses Wears Glasses: Reading only Patient Visual Report: Blurring of vision Vision Assessment?: No apparent visual deficits Perception  Perception: Within Functional Limits Praxis Praxis: Intact Cognition Overall Cognitive Status: Impaired/Different from baseline Arousal/Alertness: Awake/alert Orientation Level: Oriented X4 Attention: Alternating Alternating Attention: Impaired Alternating Attention Impairment: Verbal complex;Functional complex Memory: Impaired Awareness: Appears intact Problem Solving: Impaired Safety/Judgment: Appears intact Sensation Sensation Light Touch: Impaired Detail Light Touch Impaired Details: Impaired RUE;Impaired LUE;Impaired RLE;Impaired LLE Additional Comments: 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 parasthesia reported with testing. Coordination Gross Motor Movements are Fluid and Coordinated: No Fine Motor Movements are Fluid and Coordinated: No Finger Nose Finger Test: WNL bilaterally Heel Shin Test: limited due to strength deficits Motor  Motor Motor: Other (comment) (limited d/t pain in R digits (mild contractures) and gangrenous changes) Motor - Skilled Clinical Observations: generalized weakness and endurance deficits, pain limiting and Critical illness myopathy/neuropathy Mobility  Bed Mobility Bed Mobility: Rolling Left;Supine to Sit;Rolling Right;Sit to Supine Rolling Right: Supervision/verbal cueing Rolling Left: Supervision/Verbal cueing Supine to Sit: Supervision/Verbal cueing Sit to Supine:  Contact Guard/Touching assist  Trunk/Postural Assessment  Cervical Assessment Cervical Assessment: Within Functional Limits Thoracic Assessment Thoracic Assessment: Exceptions to [facility] Lumbar Assessment Lumbar Assessment: Exceptions to [facility] Postural Control Postural Control: Deficits on evaluation  Balance Balance Balance Assessed: Yes Static Sitting Balance Static Sitting - Level of Assistance: 5: Stand by assistance Dynamic Sitting Balance Dynamic Sitting - Balance Support: No upper extremity supported;Feet supported Dynamic Sitting - Level of Assistance:  5: Stand by assistance Dynamic Sitting - Balance Activities: Lateral lean/weight shifting;Forward lean/weight shifting Static Standing Balance Static Standing - Balance Support: During functional activity (in stedy) Static Standing - Level of Assistance: 3: Mod assist Extremity/Trunk Assessment RUE Assessment RUE Assessment: Exceptions to [facility] General Strength Comments: generalized weakness and gangrenous changes in digits limiting LUE Assessment LUE Assessment: Exceptions to [facility] General Strength Comments: generalized weakness and gangrenous changes in digits limiting   [name] 12/23/2020, 4:59 PM

## 2020-12-23 NOTE — Progress Notes (Signed)
Physical Therapy Discharge Summary  Patient Details  Name: Nikkita Adeyemi MRN: [id number] Date of Birth: March 14, 1961  Today's Date: 12/23/2020       Patient has met 5 of 7 long term goals due to improved activity tolerance, improved balance, improved postural control, increased strength, decreased pain, ability to compensate for deficits, functional use of  right lower extremity and left lower extremity, and improved coordination.  Patient to discharge at a wheelchair level [place].   Patient's care partner is independent to provide the necessary physical assistance at discharge.  Reasons goals not met: pt is limited in distance of WC mobility 2/2 decreased tolerance to activity and limited with mobility in B hands/fingers. Pt educated on technique for improved mobility, energy conservations and safety with ascending/descending x2 stairs in [facility] with husband to assist and recommended second person for front and back help.   [name] had been present for multiple session and education for pt transfers, bed mobility, WC mobility, WC parts and use, and hospital bed functions and slide board placement and use.   Recommendation:  Patient will benefit from ongoing skilled PT services in home health setting to continue to advance safe functional mobility, address ongoing impairments in standing balance, transfer training, progressing increase tolerance to standing, gait training, WC mobility and stair training, and minimize fall risk.  Equipment: Hospital bed, slide board, custom manual WC  Reasons for discharge: discharge from hospital  Patient/family agrees with progress made and goals achieved: Yes  PT Discharge Precautions/Restrictions   Vital Signs   Pain   Vision/Perception  Perception Perception: Within Functional Limits Praxis Praxis: Intact  Cognition Overall Cognitive Status: Impaired/Different from baseline Arousal/Alertness: Awake/alert Orientation Level:  Oriented X4 Attention: Alternating Alternating Attention: Impaired Alternating Attention Impairment: Verbal complex;Functional complex Problem Solving: Impaired Safety/Judgment: Appears intact Sensation Sensation Light Touch: Impaired Detail (Absent sensation gangrenous areas of upper/lower limbs) Light Touch Impaired Details: Impaired RUE;Impaired LUE;Impaired RLE;Impaired LLE Additional Comments: 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 parasthesia reported with testing. Coordination Gross Motor Movements are Fluid and Coordinated: No Fine Motor Movements are Fluid and Coordinated: No (affected by gangrenous changes in fingers) Finger Nose Finger Test: WNL bilaterally Heel Shin Test: limited due to strength deficits Motor  Motor Motor: Other (comment) Motor - Skilled Clinical Observations: generalized weakness and endurance deficits, pain limiting and Critical illness myopathy/neuropathy  Mobility Bed Mobility Bed Mobility: Rolling Left;Supine to Sit;Rolling Right;Sit to Supine Rolling Right: Supervision/verbal cueing Rolling Left: Supervision/Verbal cueing Supine to Sit: Supervision/Verbal cueing Sit to Supine: Contact Guard/Touching assist Transfers Transfers: Lateral/Scoot Transfers Lateral/Scoot Transfers: Minimal Assistance - Patient > 75% Transfer (Assistive device):  (slide baord) Locomotion  Gait Ambulation: No Gait Gait: No Stairs / Additional Locomotion Stairs: No Wheelchair Mobility Wheelchair Mobility: Yes Wheelchair Assistance: Development worker, international aid: Both upper extremities Wheelchair Parts Management: Needs assistance;Supervision/cueing Distance: 100  Trunk/Postural Assessment  Cervical Assessment Cervical Assessment: Within Functional Limits (unable to assess) Thoracic Assessment Thoracic Assessment: Exceptions to [facility] (unable to assess) Lumbar Assessment Lumbar Assessment: Exceptions to [facility] (unable to assess) Postural  Control Postural Control: Deficits on evaluation (unable to assess)  Balance Balance Balance Assessed: Yes Static Sitting Balance Static Sitting - Level of Assistance: 5: Stand by assistance Dynamic Sitting Balance Dynamic Sitting - Balance Support: No upper extremity supported;Feet supported Dynamic Sitting - Level of Assistance: 5: Stand by assistance Dynamic Sitting - Balance Activities: Lateral lean/weight shifting;Forward lean/weight shifting Extremity Assessment      RLE Assessment RLE Assessment: Exceptions to  WFL Active Range of Motion (AROM) Comments: limited at ankle 2/2 wound care needs RLE Strength Right Hip Flexion: 3+/5 Right Hip ABduction: 3+/5 Right Hip ADduction: 3+/5 Right Knee Flexion: 3+/5 Right Knee Extension: 3+/5 Right Ankle Dorsiflexion: 2-/5 Right Ankle Plantar Flexion: 2-/5 LLE Assessment LLE Assessment: Exceptions to [facility] Active Range of Motion (AROM) Comments: limited at ankle 2/2 wound care needs LLE Strength Left Hip Flexion: 3-/5 Left Hip ABduction: 3+/5 Left Hip ADduction: 3+/5 Left Knee Flexion: 3+/5 Left Knee Extension: 3+/5 Left Ankle Dorsiflexion: 2-/5 Left Ankle Plantar Flexion: 2-/5    Junie Panning 12/23/2020, 2:41 PM

## 2020-12-23 NOTE — Progress Notes (Addendum)
Patient ID: [name] [name], female   DOB: 02-14-1961, 60 y.o.   MRN: [id number]  Patient declined by [facility]  Referral sent to [facility] and [facility]  *Patient declined by De Burrs, BSW [phone]

## 2020-12-23 NOTE — Progress Notes (Signed)
Patient ID: [name] [name], female   DOB: 05-Aug-1960, 60 y.o.   MRN: [id number]  Patient declined by [facility]  [place], [place] [phone]

## 2020-12-23 NOTE — Progress Notes (Signed)
Patient discharged off of unit with all belongings. Discharge papers/instructions explained by physician assistant to family. Patient and family have no further questions at time of discharge. No complications noted at this time.  [name] [name] L [name] [name]  

## 2020-12-23 NOTE — Discharge Instructions (Addendum)
Inpatient Rehab Discharge Instructions  [name] [name] Discharge date and time: 12/23/20   Activities/Precautions/ Functional Status: Activity: activity as tolerated Diet: regular diet Wound Care: as directed--cleanse with soap and water.   Functional status:  ___ No restrictions     ___ Walk up steps independently _X_ 24/7 supervision/assistance   ___ Walk up steps with assistance ___ Intermittent supervision/assistance  ___ Bathe/dress independently ___ Walk with walker     ___ Bathe/dress with assistance ___ Walk Independently    ___ Shower independently ___ Walk with assistance    __X_ Shower with assistance _X__ No alcohol     ___ Return to work/school ________    Special Instructions:  COMMUNITY REFERRALS UPON DISCHARGE:    Home Health:   PT     OT                    Agency:[facility] Phone:    Medical Equipment/Items Ordered: Slide Board, Hoyer, Hospital Bed with Air Mattress                                                   Agency/Supplier:[employer]    My questions have been answered and I understand these instructions. I will adhere to these goals and the provided educational materials after my discharge from the hospital.  Patient/Caregiver Signature _______________________________ Date __________  Clinician Signature _______________________________________ Date __________  Please bring this form and your medication list with you to all your follow-up doctor's appointments.

## 2020-12-23 NOTE — Progress Notes (Signed)
Inpatient Rehabilitation Care Coordinator Discharge Note  The overall goal for the admission was met for:   Discharge location: Yes, home  Length of Stay: Yes, 31 Days   Discharge activity level: Yes  Home/community participation: Yes  Services provided included: MD, RD, PT, OT, SLP, RN, CM, TR, Pharmacy, Neuropsych, and SW  Financial Services: Private Insurance: [place] offered to/list presented to:Pt and spouse  Follow-up services arranged: Home Health: TBD  Comments (or additional information): PT OT Custom Wheelchair, [employer], Slide Board, [employer], Drop Arm [employer]   Patient/Family verbalized understanding of follow-up arrangements: Yes  Individual responsible for coordination of the follow-up plan: pt or spouse [phone]   Confirmed correct DME delivered: [name] 12/23/2020    [name]

## 2020-12-24 NOTE — Progress Notes (Signed)
Patient ID: [name] [name], female   DOB: [birth year], 60 y.o.   MRN: [id number]  Pt declined by [facility],  Pt referral sent to [facility] [name], [place] [phone]

## 2020-12-25 ENCOUNTER — Telehealth: Payer: Self-pay | Admitting: *Deleted

## 2020-12-25 NOTE — Telephone Encounter (Signed)
Transition Care Management Unsuccessful Follow-up Telephone Call  Date of discharge and from where:  12/23/2020 [facility]  Attempts:  1st Attempt  Reason for unsuccessful TCM follow-up call:  Left voice message   

## 2020-12-26 NOTE — Progress Notes (Addendum)
Patient ID: [name] [name], female   DOB: 04-27-1961, 60 y.o.   MRN: [id number]  6/16  Patient [facility] referral faxed to [facility]   *[facility] - declined   [place], [place] [phone]

## 2021-01-01 NOTE — Progress Notes (Signed)
Patient ID: [name] [name], female   DOB: [birth year], 60 y.o.   MRN: [id number]  6/22  Pt accepted by [facility]  [place], [place] [phone]

## 2021-01-03 ENCOUNTER — Telehealth: Payer: Self-pay | Admitting: Physical Medicine and Rehabilitation

## 2021-01-03 NOTE — Telephone Encounter (Signed)
TK OT with [facility] needs to get verbal orders to do initial eval next week, patient couldn't do this week, so she needed to push it out to next week.  It is ok to leave a message on her cell phone @ [phone].

## 2021-01-03 NOTE — Telephone Encounter (Signed)
Orders approve, TK aware.

## 2021-01-07 ENCOUNTER — Encounter: Payer: 59 | Admitting: Registered Nurse

## 2021-01-14 ENCOUNTER — Other Ambulatory Visit: Payer: Self-pay | Admitting: Physical Medicine and Rehabilitation

## 2021-01-19 ENCOUNTER — Other Ambulatory Visit: Payer: Self-pay | Admitting: Physical Medicine and Rehabilitation

## 2021-01-19 ENCOUNTER — Telehealth: Payer: Self-pay | Admitting: Physical Medicine and Rehabilitation

## 2021-01-21 NOTE — Telephone Encounter (Signed)
Refill request for Cardizem and Xarelto. Patient did not show for appt with Riley Lam in June. Please advise.

## 2021-01-22 NOTE — Telephone Encounter (Signed)
Spoke to husband Karren Burly and he states they live on the other side of [place]. Patient PCP is taking over her care.

## 2021-01-22 NOTE — Telephone Encounter (Signed)
Pt needs to get from PCP anyway- but can we let her know she needs an appointment with Riley Lam- since I'm booked out 3 months- thanks, ML

## 2021-02-25 ENCOUNTER — Telehealth: Payer: Self-pay

## 2021-02-25 NOTE — Telephone Encounter (Signed)
[name] [name] from home health , called wanting to get orders for woundcare and skill care orders how ever it looks like pt canceled appt with [place] and husband stated that she was going to resume care with PCP . We were not able to give those orders , we havent evaluated pt in our office .

## 2021-04-16 ENCOUNTER — Other Ambulatory Visit: Payer: Self-pay | Admitting: Physical Medicine and Rehabilitation

## 2021-06-18 ENCOUNTER — Other Ambulatory Visit: Payer: Self-pay | Admitting: Physical Medicine and Rehabilitation

## 2021-07-16 ENCOUNTER — Other Ambulatory Visit: Payer: Self-pay | Admitting: Physical Medicine and Rehabilitation

## 2021-08-17 IMAGING — DX DG ABD PORTABLE 1V
1 series · 1 of 1 positions shown · non-contrast
Comparison: None.

CLINICAL DATA: Feeding tube placement.

EXAM:
PORTABLE ABDOMEN - 1 VIEW

[abdomen]
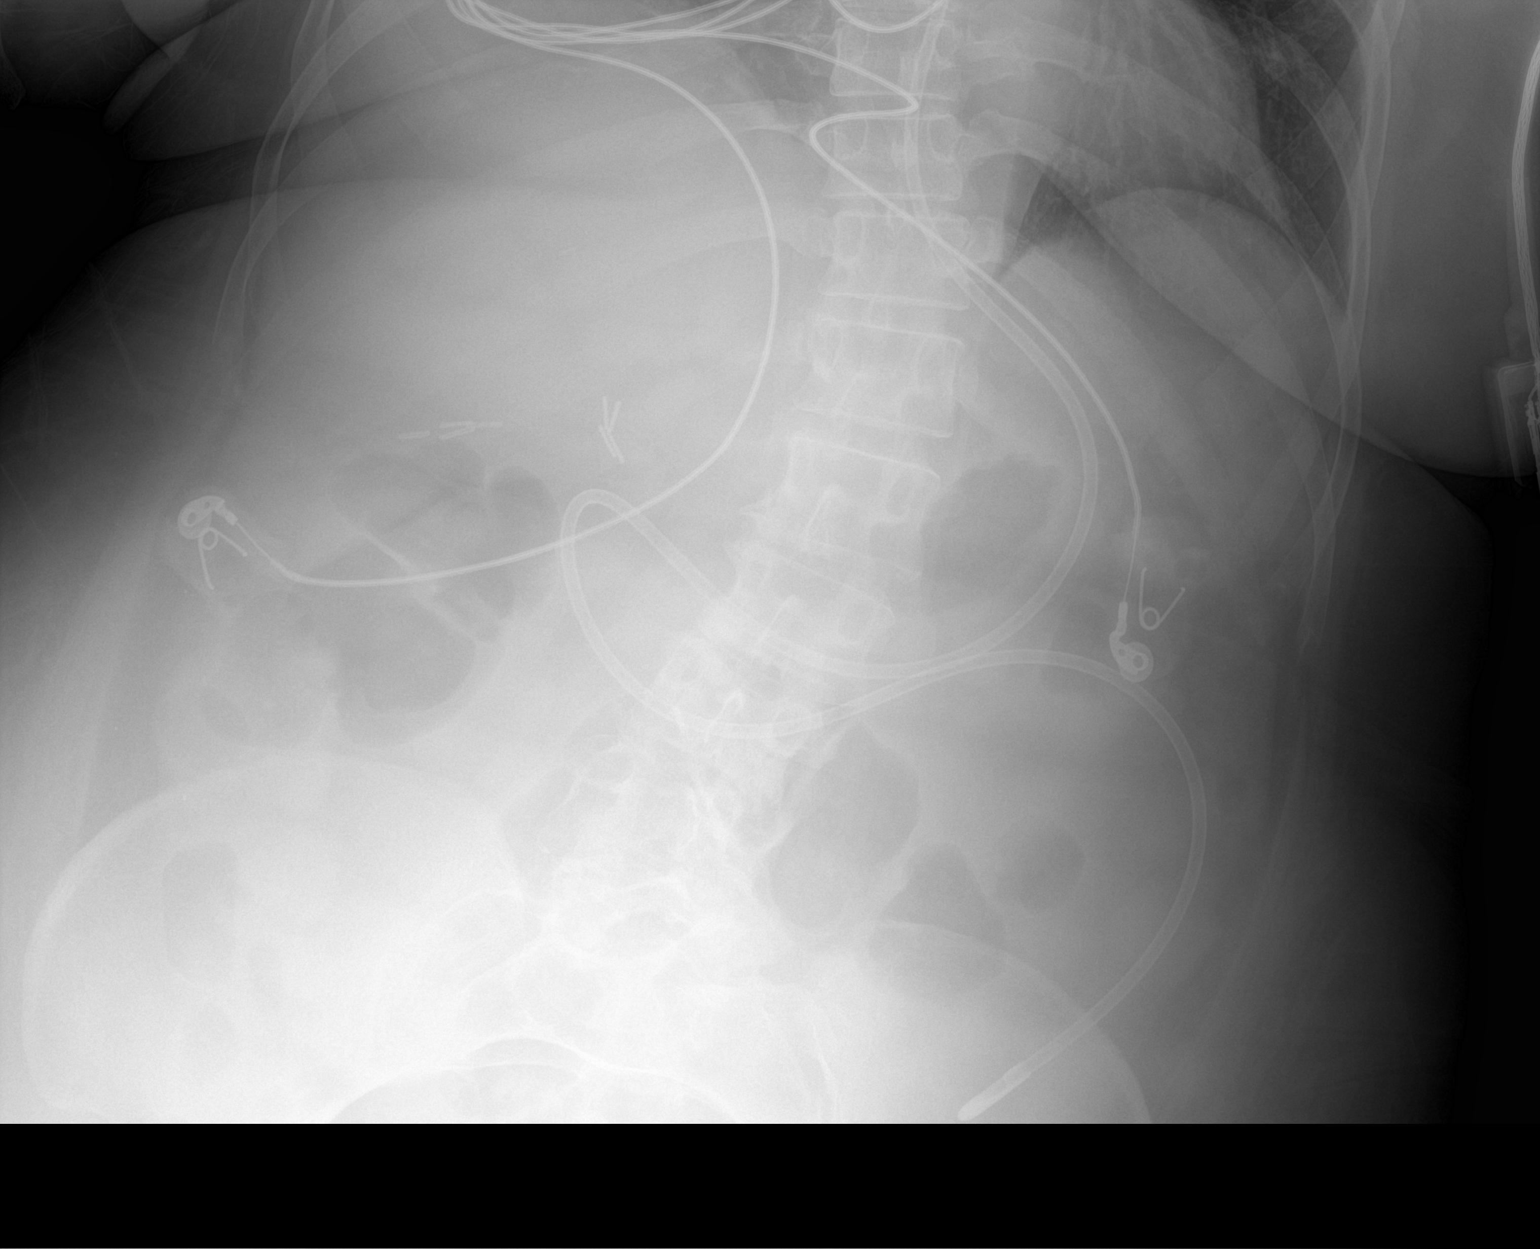

[1 of 1 positions shown; findings below may reference images not displayed]

FINDINGS: Feeding tube is seen passing through the esophagus and duodenum and
beyond the expected position of the ligament of Treitz, with distal
tip in expected position of proximal jejunum.
IMPRESSION: Distal tip of feeding tube is seen in expected position of proximal
jejunum.

## 2022-01-21 ENCOUNTER — Other Ambulatory Visit: Payer: Self-pay | Admitting: Physical Medicine and Rehabilitation

## 2022-03-04 ENCOUNTER — Other Ambulatory Visit: Payer: Self-pay | Admitting: Physical Medicine and Rehabilitation
